# Patient Record
Sex: Female | Born: 1965 | Race: White | Hispanic: No | Marital: Married | Smoking: Former smoker
Health system: Southern US, Community
[De-identification: ages and names within clinical notes are randomized; demographics above are authoritative.]

## PROBLEM LIST (undated history)

## (undated) DIAGNOSIS — J189 Pneumonia, unspecified organism: Secondary | ICD-10-CM

## (undated) DIAGNOSIS — F32A Depression, unspecified: Secondary | ICD-10-CM

## (undated) DIAGNOSIS — Z5189 Encounter for other specified aftercare: Secondary | ICD-10-CM

## (undated) DIAGNOSIS — M1990 Unspecified osteoarthritis, unspecified site: Secondary | ICD-10-CM

## (undated) DIAGNOSIS — K219 Gastro-esophageal reflux disease without esophagitis: Secondary | ICD-10-CM

## (undated) DIAGNOSIS — F988 Other specified behavioral and emotional disorders with onset usually occurring in childhood and adolescence: Secondary | ICD-10-CM

## (undated) DIAGNOSIS — F329 Major depressive disorder, single episode, unspecified: Secondary | ICD-10-CM

## (undated) DIAGNOSIS — F419 Anxiety disorder, unspecified: Secondary | ICD-10-CM

## (undated) DIAGNOSIS — E785 Hyperlipidemia, unspecified: Secondary | ICD-10-CM

## (undated) DIAGNOSIS — I10 Essential (primary) hypertension: Secondary | ICD-10-CM

## (undated) HISTORY — DX: Major depressive disorder, single episode, unspecified: F32.9

## (undated) HISTORY — PX: ABDOMINAL HYSTERECTOMY: SHX81

## (undated) HISTORY — PX: KNEE CARTILAGE SURGERY: SHX688

## (undated) HISTORY — PX: TONSILLECTOMY: SUR1361

## (undated) HISTORY — DX: Encounter for other specified aftercare: Z51.89

## (undated) HISTORY — DX: Depression, unspecified: F32.A

## (undated) HISTORY — DX: Other specified behavioral and emotional disorders with onset usually occurring in childhood and adolescence: F98.8

## (undated) HISTORY — PX: OTHER SURGICAL HISTORY: SHX169

## (undated) HISTORY — DX: Anxiety disorder, unspecified: F41.9

## (undated) HISTORY — PX: CARPAL TUNNEL RELEASE: SHX101

## (undated) HISTORY — PX: FOOT SURGERY: SHX648

## (undated) HISTORY — DX: Hyperlipidemia, unspecified: E78.5

## (undated) HISTORY — PX: BUNIONECTOMY: SHX129

## (undated) HISTORY — DX: Gastro-esophageal reflux disease without esophagitis: K21.9

---

## 1991-02-23 HISTORY — PX: ABDOMINAL HYSTERECTOMY: SHX81

## 2011-08-02 DIAGNOSIS — Z803 Family history of malignant neoplasm of breast: Secondary | ICD-10-CM | POA: Insufficient documentation

## 2015-05-15 DIAGNOSIS — H8112 Benign paroxysmal vertigo, left ear: Secondary | ICD-10-CM | POA: Insufficient documentation

## 2016-11-26 ENCOUNTER — Telehealth: Payer: Self-pay | Admitting: *Deleted

## 2016-11-26 NOTE — Telephone Encounter (Signed)
Received Medical records [x2] from [facility]; forwarded to provider/SLS 10/05

## 2016-11-29 ENCOUNTER — Telehealth: Payer: Self-pay | Admitting: Behavioral Health

## 2016-11-29 NOTE — Telephone Encounter (Signed)
Unable to reach patient at time of Pre-Visit Call.  Left message for patient to return call when available.    

## 2016-11-30 ENCOUNTER — Ambulatory Visit (INDEPENDENT_AMBULATORY_CARE_PROVIDER_SITE_OTHER): Payer: PRIVATE HEALTH INSURANCE | Admitting: Medical

## 2016-11-30 ENCOUNTER — Encounter: Payer: Self-pay | Admitting: Medical

## 2016-11-30 VITALS — BP 130/85 | HR 78 | Temp 98.1°F | Ht 67.0 in | Wt 209.0 lb

## 2016-11-30 DIAGNOSIS — F909 Attention-deficit hyperactivity disorder, unspecified type: Secondary | ICD-10-CM | POA: Diagnosis not present

## 2016-11-30 DIAGNOSIS — Z23 Encounter for immunization: Secondary | ICD-10-CM

## 2016-11-30 DIAGNOSIS — F32A Depression, unspecified: Secondary | ICD-10-CM

## 2016-11-30 DIAGNOSIS — Z0283 Encounter for blood-alcohol and blood-drug test: Secondary | ICD-10-CM

## 2016-11-30 DIAGNOSIS — F419 Anxiety disorder, unspecified: Secondary | ICD-10-CM | POA: Diagnosis not present

## 2016-11-30 DIAGNOSIS — F329 Major depressive disorder, single episode, unspecified: Secondary | ICD-10-CM

## 2016-11-30 MED ORDER — CLONAZEPAM 0.5 MG PO TABS: 0.5000 mg | ORAL_TABLET | Freq: Every day | ORAL | 0 refills | Status: DC

## 2016-11-30 MED ORDER — AMPHETAMINE-DEXTROAMPHETAMINE 15 MG PO TABS
15.0000 mg | ORAL_TABLET | Freq: Two times a day (BID) | ORAL | 0 refills | Status: DC
Start: 2016-11-30 — End: 2016-12-23

## 2016-11-30 NOTE — Patient Instructions (Addendum)
For your history of ADHD will rx adderral xr.  For chronic anxiety will rx clonazepam.  Will get uds today and sign controlled medication contract.  Follow up in 1 month or as needed.   On follow up you could also schedule complete physical exam if due.(for that come I fasting)

## 2016-11-30 NOTE — Progress Notes (Signed)
Subjective:    Patient ID: Abigail Wright, female    DOB: 06-12-1965, 51 y.o.   MRN: [id number]  HPI  Pt here for first time.  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   She has hx of Chronic anxiety, hyperlipidemia, metabolic syndrome, depression, ADHD, and GERD. She is former patient of [facility].  Pt in for new visit.   Insurance issues with other clinic led to changing practices.  Pt has history of chronic anxiety- has been on benzodiazepene for years. Just ran out of medication. Pt in past states celexa made her feel like she was in a fog. Pt is on wellbutrin. She does well with wellbutrin.(sometimes bit of depression but more anxiety)  Pt has history of ADHD- has been on medication for a year. See adhd questioneer scanned.   Review of Systems  Constitutional: Negative for chills, fatigue and fever.  HENT: Negative for congestion and drooling.   Respiratory: Negative for cough, chest tightness, shortness of breath and wheezing.   Cardiovascular: Negative for chest pain and palpitations.  Gastrointestinal: Negative for abdominal pain.  Genitourinary: Negative for dyspareunia and dysuria.  Musculoskeletal: Negative for back pain, joint swelling and neck pain.  Skin: Negative for rash.  Neurological: Negative for dizziness, syncope, speech difficulty, weakness, light-headedness and numbness.  Hematological: Negative for adenopathy. Does not bruise/bleed easily.  Psychiatric/Behavioral: Positive for decreased concentration and dysphoric mood. Negative for behavioral problems, confusion and suicidal ideas. The patient is nervous/anxious.    Past Medical History:  Diagnosis Date  . ADD (attention deficit disorder)   . Anxiety      Social History   Social History  . Marital status: Married    Spouse name: N/A  . Number of children: N/A  . Years of education: N/A    Occupational History  . Not on file.   Social History Main Topics  . Smoking status: Former Games developer  . Smokeless tobacco: Never Used  . Alcohol use Yes  . Drug use: No  . Sexual activity: Yes    Partners: Male    Birth control/ protection: None   Other Topics Concern  . Not on file   Social History Narrative  . No narrative on file    Past Surgical History:  Procedure Laterality Date  . ABDOMINAL HYSTERECTOMY    . cyst removal from left hand Left   . FOOT SURGERY    . TONSILLECTOMY      Family History  Problem Relation Age of Onset  . Cancer Mother   . Diabetes Father   . Heart disease Paternal Uncle   . Hypertension Paternal Uncle     Not on File  No current outpatient prescriptions on file prior to visit.   No current facility-administered medications on file prior to visit.     BP (!) 132/93   Pulse 78   Temp 98.1 F (36.7 C) (Oral)   Ht  (1.702 m)   Wt 209 lb (94.8 kg)   SpO2 98%   BMI 32.73 kg/m       Objective:   Physical Exam  General Mental Status- Alert. General Appearance- Not in acute distress.     Chest and Lung Exam Auscultation: Breath Sounds:-Normal.  Cardiovascular Auscultation:Rythm- Regular. Murmurs & Other Heart Sounds:Auscultation of the heart reveals- No Murmurs.    Neurologic Cranial Nerve exam:- CN III-XII intact(No nystagmus), symmetric smile. Strength:- 5/5  equal and symmetric strength both upper and lower extremities.      Assessment & Plan:  For your history of ADHD will rx adderral xr.  For chronic anxiety will rx clonazepam.  Will get uds today and sign controlled medication contract.(sent to lab and I placed her uds to be scanned.  Our office attempted to look up pt in controlled med web site for state of [place]. No results found. Prior records [facility] labled her as low risk.   Follow up in 1 month or as needed.   Abigail Wright, Ramon Dredge, PA-C

## 2016-12-04 ENCOUNTER — Telehealth: Payer: Self-pay | Admitting: Medical

## 2016-12-04 LAB — PAIN MGMT, PROFILE 8 W/CONF, U
6 Acetylmorphine: NEGATIVE ng/mL (ref <10)
ALCOHOL METABOLITES: NEGATIVE ng/mL (ref <500)
ALPHAHYDROXYALPRAZOLAM: NEGATIVE ng/mL (ref <25)
ALPHAHYDROXYTRIAZOLAM: NEGATIVE ng/mL (ref <50)
AMPHETAMINE: 627 ng/mL — AB (ref <250)
Alphahydroxymidazolam: NEGATIVE ng/mL (ref <50)
Aminoclonazepam: 76 ng/mL — ABNORMAL HIGH (ref <25)
Amphetamines: POSITIVE ng/mL — AB (ref <500)
BENZODIAZEPINES: POSITIVE ng/mL — AB (ref <100)
Buprenorphine, Urine: NEGATIVE ng/mL (ref <5)
COCAINE METABOLITE: NEGATIVE ng/mL (ref <150)
Creatinine: 44.5 mg/dL
HYDROXYETHYLFLURAZEPAM: NEGATIVE ng/mL (ref <50)
Lorazepam: NEGATIVE ng/mL (ref <50)
MARIJUANA METABOLITE: NEGATIVE ng/mL (ref <20)
MDMA: NEGATIVE ng/mL (ref <500)
METHAMPHETAMINE: NEGATIVE ng/mL (ref <250)
Nordiazepam: NEGATIVE ng/mL (ref <50)
OXAZEPAM: NEGATIVE ng/mL (ref <50)
OXIDANT: NEGATIVE ug/mL (ref <200)
OXYCODONE: NEGATIVE ng/mL (ref <100)
Opiates: NEGATIVE ng/mL (ref <100)
Temazepam: NEGATIVE ng/mL (ref <50)
pH: 6.92 (ref 4.5–9.0)

## 2016-12-04 NOTE — Telephone Encounter (Signed)
Opened to review controlled med list history.

## 2016-12-23 ENCOUNTER — Ambulatory Visit (INDEPENDENT_AMBULATORY_CARE_PROVIDER_SITE_OTHER): Payer: PRIVATE HEALTH INSURANCE | Admitting: Medical

## 2016-12-23 ENCOUNTER — Encounter: Payer: Self-pay | Admitting: Medical

## 2016-12-23 VITALS — BP 125/88 | HR 73 | Temp 98.2°F | Resp 16 | Ht 67.0 in | Wt 210.0 lb

## 2016-12-23 DIAGNOSIS — F419 Anxiety disorder, unspecified: Secondary | ICD-10-CM | POA: Diagnosis not present

## 2016-12-23 DIAGNOSIS — F909 Attention-deficit hyperactivity disorder, unspecified type: Secondary | ICD-10-CM | POA: Diagnosis not present

## 2016-12-23 MED ORDER — CLONAZEPAM 0.5 MG PO TABS: 0.5000 mg | ORAL_TABLET | Freq: Every day | ORAL | 3 refills | Status: DC

## 2016-12-23 MED ORDER — AMPHETAMINE-DEXTROAMPHETAMINE 15 MG PO TABS: 15.0000 mg | ORAL_TABLET | Freq: Two times a day (BID) | ORAL | 0 refills | Status: DC

## 2016-12-23 NOTE — Patient Instructions (Addendum)
Your anxiety has been well controlled.  Continue current medication regimen for your mood.  Refilling her clonazepam today.  Your ADHD is well controlled presently with no side effects.  I am providing you with 4 months of medication but the prescriptions are post dated. Do not loose prescriptions. Explained the importance of this. Pt expressed understanding.  Follow up in 4 months or as needed

## 2016-12-23 NOTE — Progress Notes (Signed)
Subjective:    Patient ID: Abigail Wright, female    DOB: [birth year], 51 y.o.   MRN: [id number]  HPI  Pt anxiety and ADD well controlled. No side effects from medication reported. BP is well controlled today. No tachycardia today. Mood is good/well controlled.   Pt signed controlled medication contract. Old records review and summer she had a UDS. Positive for ADD med. No illegal drugs or other positives on screen done at former clinic.  Review of Systems  Constitutional: Negative for chills, fatigue and fever.  Respiratory: Negative for cough, chest tightness, shortness of breath and wheezing.   Cardiovascular: Negative for chest pain and palpitations.  Musculoskeletal: Negative for back pain.  Neurological: Negative for dizziness, speech difficulty, weakness, light-headedness and headaches.  Psychiatric/Behavioral: Negative for agitation, behavioral problems, decreased concentration, hallucinations, self-injury and sleep disturbance. The patient is not nervous/anxious.    Past Medical History:  Diagnosis Date  . ADD (attention deficit disorder)   . Anxiety   . Depression   . GERD (gastroesophageal reflux disease)    in past but recently controlled with diet and exercise.  Marland Kitchen Hyperlipidemia      Social History   Social History  . Marital status: Married    Spouse name: N/A  . Number of children: N/A  . Years of education: N/A   Occupational History  . Not on file.   Social History Main Topics  . Smoking status: Former Games developer  . Smokeless tobacco: Never Used  . Alcohol use Yes  . Drug use: No  . Sexual activity: Yes    Partners: Male    Birth control/ protection: None   Other Topics Concern  . Not on file   Social History Narrative  . No narrative on file    Past Surgical History:  Procedure Laterality Date  . ABDOMINAL HYSTERECTOMY    . cyst removal from left hand Left   . FOOT SURGERY    . TONSILLECTOMY      Family History  Problem Relation Age of  Onset  . Cancer Mother   . Diabetes Father   . Heart disease Paternal Uncle   . Hypertension Paternal Uncle     Not on File  Current Outpatient Prescriptions on File Prior to Visit  Medication Sig Dispense Refill  . buPROPion (WELLBUTRIN) 75 MG tablet Take 75 mg by mouth.    . doxycycline (VIBRAMYCIN) 100 MG capsule Take 100 mg by mouth 2 (two) times daily.    . fluticasone (FLONASE) 50 MCG/ACT nasal spray Place into the nose.    Marland Kitchen glucosamine-chondroitin 500-400 MG tablet Take by mouth.    . Multiple Vitamins-Minerals (THERA-M) TABS Take by mouth.     No current facility-administered medications on file prior to visit.     BP 125/88   Pulse 73   Temp 98.2 F (36.8 C) (Oral)   Resp 16   Ht 5\' 7"  (1.702 m)   Wt 210 lb (95.3 kg)   SpO2 100%   BMI 32.89 kg/m       Objective:   Physical Exam  General Mental Status- Alert. General Appearance- Not in acute distress.   Skin General: Color- Normal Color. Moisture- Normal Moisture.  Neck Carotid Arteries- Normal color. Moisture- Normal Moisture. No carotid bruits. No JVD.  Chest and Lung Exam Auscultation: Breath Sounds:-Normal.  Cardiovascular Auscultation:Rythm- Regular. Murmurs & Other Heart Sounds:Auscultation of the heart reveals- No Murmurs.  Abdomen Inspection:-Inspeection Normal. Palpation/Percussion:Note:No mass. Palpation and Percussion of the abdomen reveal-  Non Tender, Non Distended + BS, no rebound or guarding.    Neurologic Cranial Nerve exam:- CN III-XII intact(No nystagmus), symmetric smile. Strength:- 5/5 equal and symmetric strength both upper and lower extremities.      Assessment & Plan:  Your anxiety has been well controlled.  Continue current medication regimen for your mood.  Refilling her clonazepam today.  Your ADHD is well controlled presently with no side effects.  I am providing you with 4 months of medication but the prescriptions are post dated. Do not loose prescriptions.  Explained the importance of this. Pt expressed understanding.  Follow up in 4 months or as needed  Donte Kary, Ramon DredgeEdward, VF CorporationPA-C

## 2016-12-24 ENCOUNTER — Ambulatory Visit: Payer: PRIVATE HEALTH INSURANCE | Admitting: Medical

## 2017-03-21 HISTORY — PX: TENDON REPAIR: SHX5111

## 2017-04-25 ENCOUNTER — Ambulatory Visit: Payer: PRIVATE HEALTH INSURANCE | Admitting: Medical

## 2017-04-25 ENCOUNTER — Encounter: Payer: Self-pay | Admitting: Medical

## 2017-04-25 VITALS — BP 131/85 | HR 95 | Temp 98.1°F | Resp 16 | Ht 67.0 in | Wt 219.6 lb

## 2017-04-25 DIAGNOSIS — Z79899 Other long term (current) drug therapy: Secondary | ICD-10-CM

## 2017-04-25 DIAGNOSIS — F909 Attention-deficit hyperactivity disorder, unspecified type: Secondary | ICD-10-CM

## 2017-04-25 DIAGNOSIS — F419 Anxiety disorder, unspecified: Secondary | ICD-10-CM | POA: Diagnosis not present

## 2017-04-25 DIAGNOSIS — F32A Depression, unspecified: Secondary | ICD-10-CM

## 2017-04-25 DIAGNOSIS — F329 Major depressive disorder, single episode, unspecified: Secondary | ICD-10-CM

## 2017-04-25 MED ORDER — BUPROPION HCL ER (XL) 300 MG PO TB24: 300.0000 mg | ORAL_TABLET | Freq: Every day | ORAL | 3 refills | Status: DC

## 2017-04-25 MED ORDER — AMPHETAMINE-DEXTROAMPHETAMINE 15 MG PO TABS: 15.0000 mg | ORAL_TABLET | Freq: Two times a day (BID) | ORAL | 0 refills | Status: DC

## 2017-04-25 MED ORDER — BUPROPION HCL 75 MG PO TABS: ORAL_TABLET | ORAL | 1 refills | Status: DC

## 2017-04-25 NOTE — Patient Instructions (Addendum)
Your ADD is controlled recently and I did refill that prescription today.  You will be able to fill that one year current prescription expires.  Your anxiety is controlled and advise you continue the clonazepam.  When you run out of current prescription and then will refill the clonazepam.  Your depression is well controlled recently and I sent in refills of Wellbutrin.  Please schedule complete physical in 3 months or as needed.  Come in fasting that day.

## 2017-04-25 NOTE — Progress Notes (Signed)
Subjective:    Patient ID: Abigail Wright, female    DOB: Sep 07, 1965, 52 y.o.   MRN: [id number]  HPI  Pt in for follow up. She is doing well with her attention. No side effects with her adderal.   Pt bp not elevated. Not tachycardic.  Pt mood is well controlled on Wellbutrin.     Review of Systems  Constitutional: Negative for chills, diaphoresis and fatigue.  Respiratory: Negative for cough, chest tightness, shortness of breath and wheezing.   Cardiovascular: Negative for chest pain and palpitations.  Gastrointestinal: Negative for abdominal pain and blood in stool.  Musculoskeletal: Negative for back pain, joint swelling, myalgias and neck stiffness.       Recent gangliion cyst surgery.  Neurological: Negative for dizziness and headaches.  Hematological: Negative for adenopathy. Does not bruise/bleed easily.  Psychiatric/Behavioral: Negative for behavioral problems, confusion, dysphoric mood, self-injury and suicidal ideas.       Concentration good.    Past Medical History:  Diagnosis Date  . ADD (attention deficit disorder)   . Anxiety   . Depression   . GERD (gastroesophageal reflux disease)    in past but recently controlled with diet and exercise.  Marland Kitchen Hyperlipidemia      Social History   Socioeconomic History  . Marital status: Married    Spouse name: Not on file  . Number of children: Not on file  . Years of education: Not on file  . Highest education level: Not on file  Social Needs  . Financial resource strain: Not on file  . Food insecurity - worry: Not on file  . Food insecurity - inability: Not on file  . Transportation needs - medical: Not on file  . Transportation needs - non-medical: Not on file  Occupational History  . Not on file  Tobacco Use  . Smoking status: Former Games developer  . Smokeless tobacco: Never Used  Substance and Sexual Activity  . Alcohol use: Yes  . Drug use: No  . Sexual activity: Yes    Partners: Male    Birth  control/protection: None  Other Topics Concern  . Not on file  Social History Narrative  . Not on file      Family History  Problem Relation Age of Onset  . Cancer Mother   . Diabetes Father   . Heart disease Paternal Uncle   . Hypertension Paternal Uncle     Not on File  Current Outpatient Medications on File Prior to Visit  Medication Sig Dispense Refill  . amphetamine-dextroamphetamine (ADDERALL) 15 MG tablet Take 1 tablet by mouth 2 (two) times daily. Can give generic 60 tablet 0  . buPROPion (WELLBUTRIN) 75 MG tablet Take 75 mg by mouth.    . clonazePAM (KLONOPIN) 0.5 MG tablet Take 1 tablet (0.5 mg total) by mouth at bedtime. Please don't refill until November 9,2018 30 tablet 3  . doxycycline (VIBRAMYCIN) 100 MG capsule Take 100 mg by mouth 2 (two) times daily.    . fluticasone (FLONASE) 50 MCG/ACT nasal spray Place into the nose.    Marland Kitchen glucosamine-chondroitin 500-400 MG tablet Take by mouth.    . meloxicam (MOBIC) 15 MG tablet Take by mouth.    . Multiple Vitamins-Minerals (THERA-M) TABS Take by mouth.     No current facility-administered medications on file prior to visit.     BP 131/85   Pulse 95   Temp 98.1 F (36.7 C) (Oral)   Resp 16   Ht 5\' 7"  (1.702 m)  Wt 219 lb 9.6 oz (99.6 kg)   SpO2 99%   BMI 34.39 kg/m       Objective:   Physical Exam   General- No acute distress. Pleasant patient. Neck- Full range of motion, no jvd Lungs- Clear, even and unlabored. Heart- regular rate and rhythm. Neurologic- CNII- XII grossly intact.      Assessment & Plan:  Your ADD is controlled recently and I did refill that prescription today.  You will be able to fill that one year current prescription expires.  Your anxiety is controlled and advise you continue the clonazepam.  When you run out of current prescription and then will refill the clonazepam.  Your depression is well controlled recently and I sent in refills of Wellbutrin.  At the very end of visit  patient states that her pharmacy called and stated her pain was Wellbutrin XL 300 mg a day.  This is different from our practice.  I have asked medical assistant Jasmine to call pharmacy and clarify/verify this was flu direction. After hours it looks like she did not cal the pharmacy. I sent in scription of the generic 300 mg extended release form.  I asked pharmacy not to fill the prescription if this is not her current dose.  Also asked him to contact me.  Please schedule complete physical in 3 months or as needed l.  Come in fasting that day.   Esperanza RichtersEdward Abiola Behring, PA-C

## 2017-04-25 NOTE — Progress Notes (Signed)
7

## 2017-04-26 ENCOUNTER — Telehealth: Payer: Self-pay | Admitting: Medical

## 2017-04-26 NOTE — Telephone Encounter (Signed)
Copied from CRM [id number]. Topic: Inquiry >> Apr 26, 2017  7:58 AM Yvonna Alanisobinson, Andra M wrote: Reason for CRM: Patient called stating that her prescription of BuPROPion (WELLBUTRIN XL) 300 MG 24 hr tablet is not at her pharmacy. Patient saw Clement Sayresdward Sagiuer on yesterday. [facility] [place] - [id number] [phone] (Phone) [phone] (Fax).

## 2017-04-26 NOTE — Telephone Encounter (Signed)
[facility] called and spoke to JaneRyan, [facility] who verbalized patient picked up medication yesterday.

## 2017-05-26 ENCOUNTER — Other Ambulatory Visit: Payer: Self-pay | Admitting: Medical

## 2017-05-26 NOTE — Telephone Encounter (Signed)
I refilled her clonazepam. But can you double check on controlled substance contract. She should have one. I remember on  last visit making sure all requiirements met?  Also I just sent rx but accidentally did not give refills. Let pt know that was a mistake but will refill next month with 2 refills.  If no contract then she needs to come in just to review and sign.

## 2017-05-26 NOTE — Telephone Encounter (Signed)
Last RX: 12/23/2016 3refills Last OV:04/25/2017 Next OV:07/26/2017 UDS:04/25/2017 CSC: None on file

## 2017-05-28 ENCOUNTER — Other Ambulatory Visit: Payer: Self-pay | Admitting: Medical

## 2017-05-29 ENCOUNTER — Telehealth: Payer: Self-pay | Admitting: Medical

## 2017-05-29 NOTE — Telephone Encounter (Signed)
Will you check on pt uds status. Computer reminder states she is not up to date. Will you check please. Let me know. If not up to date then get her scheduled for one.

## 2017-05-30 ENCOUNTER — Telehealth: Payer: Self-pay | Admitting: *Deleted

## 2017-05-30 ENCOUNTER — Telehealth: Payer: Self-pay | Admitting: Medical

## 2017-05-30 ENCOUNTER — Other Ambulatory Visit: Payer: Self-pay | Admitting: Medical

## 2017-05-30 MED ORDER — AMPHETAMINE-DEXTROAMPHETAMINE 15 MG PO TABS: 15.0000 mg | ORAL_TABLET | Freq: Two times a day (BID) | ORAL | 0 refills | Status: DC

## 2017-05-30 NOTE — Telephone Encounter (Signed)
Copied from CRM [id number]. Topic: Quick Communication - Rx Refill/Question >> May 30, 2017  9:06 AM Oneal GroutSebastian, Jennifer S wrote: Medication: amphetamine-dextroamphetamine (ADDERALL) 15 MG tablet  Has the patient contacted their pharmacy? Yes.   (Agent: If no, request that the patient contact the pharmacy for the refill.) Preferred Pharmacy (with phone number or street name): [facility] Agent: Please be advised that RX refills may take up to 3 business days. We ask that you follow-up with your pharmacy.

## 2017-05-30 NOTE — Telephone Encounter (Signed)
Refill  Request Adderall  LOV 04-25-2017  Pharmacy on File

## 2017-05-30 NOTE — Telephone Encounter (Signed)
Pt signed contract on 11/30/2016

## 2017-05-30 NOTE — Telephone Encounter (Signed)
Refill of adderall sent to pt pharmacy.

## 2017-05-30 NOTE — Telephone Encounter (Signed)
Copied from CRM [id number]. Topic: Inquiry >> May 30, 2017  5:37 PM Raquel SarnaHayes, Teresa G wrote: Pt is requesting [facility] to Dr. Carmelia RollerWendling. Pt's daughter is going to Dr Carmelia RollerWendling and not having issues with Rx's being filled.

## 2017-05-30 NOTE — Telephone Encounter (Signed)
But what about uds. The reminder was about drug screen?

## 2017-05-30 NOTE — Telephone Encounter (Signed)
Can switch to Dr. Carmelia RollerWendling, Regarding problems some reason reminder for UDS popped up in computer. I did find the result lab section  . I was not in town on Thursday or Friday when she was due for refill.  So it does look like she is due for refill.   I will send that rx to her pharmacy.  Can switch to Dr. Carmelia RollerWendling if she wants.

## 2017-05-30 NOTE — Telephone Encounter (Signed)
Pt called to check status of refill, call pt to advise °

## 2017-05-31 ENCOUNTER — Encounter: Payer: Self-pay | Admitting: Medical

## 2017-05-31 NOTE — Telephone Encounter (Signed)
Dr Carmelia RollerWendling-- is this PCP change ok with you?

## 2017-05-31 NOTE — Telephone Encounter (Signed)
UDS is under lab tab last uds was 11/30/2016

## 2017-05-31 NOTE — Telephone Encounter (Signed)
I reviewed her medicines and may suggest some changes if she agrees to stay with me, but she is welcome to.

## 2017-05-31 NOTE — Telephone Encounter (Signed)
Ok to switch to Dr. Wendling. 

## 2017-05-31 NOTE — Telephone Encounter (Signed)
Notified pt. She states she is not unhappy with her current PCP, just unhappy that prescriptions have been delayed for the last couple of months. Pt states she used to get printed prescriptions for 3 months at a time to last until her next follow up and never had a problem with getting refills. Now Rxs are only for 30 days at a time with no refills and she has run out of medications for the last 2 months. Pt states she feels she is doing her part as far as keeping appointments and requesting her refills.  Pt wants to know what can be done to prevent this in the future and she will be happy to stay with current PCP.  Please advise?

## 2017-06-01 ENCOUNTER — Telehealth: Payer: Self-pay | Admitting: Medical

## 2017-06-01 MED ORDER — AMPHETAMINE-DEXTROAMPHETAMINE 15 MG PO TABS: 15.0000 mg | ORAL_TABLET | Freq: Two times a day (BID) | ORAL | 0 refills | Status: DC

## 2017-06-01 NOTE — Telephone Encounter (Signed)
Pt reported below that she was not really wanting to change PCPs if she can continue to get her refills in a timely manner. I sent pt a mychart message to request refills on any medication 1 week prior to running out and to let us know if she changes her mind in the future about PCP change but will leave with PA, Saguier at this time.

## 2017-06-01 NOTE — Telephone Encounter (Signed)
I did talk with pharmacist today and he stated that it was okay for me to send postdated prescriptions via imprivata.  But I could only do for 3 months at a time.  So I went ahead and send in 2 prescriptions one for May and another June.

## 2017-06-27 ENCOUNTER — Other Ambulatory Visit: Payer: Self-pay | Admitting: Medical

## 2017-06-28 NOTE — Telephone Encounter (Signed)
Rx Refill of clonazepam sent to pt pharmacy.

## 2017-06-28 NOTE — Telephone Encounter (Signed)
Refill Request: Clonazepam  Last RX:05/26/2017 Last OV: 3/4/209 Next OV:07/26/2017 UDS: 11/30/2016 CSC:11/30/2016

## 2017-06-28 NOTE — Telephone Encounter (Signed)
Pt. Is calling about prescription again.  She is out of medication  Please send to [facility]

## 2017-06-29 ENCOUNTER — Encounter: Payer: Self-pay | Admitting: Medical

## 2017-06-29 ENCOUNTER — Telehealth: Payer: Self-pay | Admitting: Medical

## 2017-06-29 MED ORDER — AMPHETAMINE-DEXTROAMPHETAMINE 15 MG PO TABS: 15.0000 mg | ORAL_TABLET | Freq: Two times a day (BID) | ORAL | 0 refills | Status: DC

## 2017-06-29 NOTE — Telephone Encounter (Signed)
Rx adderall sent to the pharmacy.

## 2017-07-21 ENCOUNTER — Encounter: Payer: Self-pay | Admitting: Medical

## 2017-07-26 ENCOUNTER — Encounter: Payer: Self-pay | Admitting: Internal Medicine

## 2017-07-26 ENCOUNTER — Ambulatory Visit (INDEPENDENT_AMBULATORY_CARE_PROVIDER_SITE_OTHER): Payer: PRIVATE HEALTH INSURANCE | Admitting: Medical

## 2017-07-26 ENCOUNTER — Encounter: Payer: Self-pay | Admitting: Medical

## 2017-07-26 VITALS — BP 124/81 | HR 61 | Temp 97.9°F | Resp 16 | Ht 67.0 in | Wt 214.4 lb

## 2017-07-26 DIAGNOSIS — Z0000 Encounter for general adult medical examination without abnormal findings: Secondary | ICD-10-CM

## 2017-07-26 DIAGNOSIS — Z1211 Encounter for screening for malignant neoplasm of colon: Secondary | ICD-10-CM

## 2017-07-26 DIAGNOSIS — Z113 Encounter for screening for infections with a predominantly sexual mode of transmission: Secondary | ICD-10-CM

## 2017-07-26 DIAGNOSIS — Z23 Encounter for immunization: Secondary | ICD-10-CM | POA: Diagnosis not present

## 2017-07-26 LAB — COMPREHENSIVE METABOLIC PANEL
ALBUMIN: 4.3 g/dL (ref 3.5–5.2)
ALT: 19 U/L (ref 0–35)
AST: 18 U/L (ref 0–37)
Alkaline Phosphatase: 52 U/L (ref 39–117)
BUN: 13 mg/dL (ref 6–23)
CHLORIDE: 102 meq/L (ref 96–112)
CO2: 31 meq/L (ref 19–32)
CREATININE: 0.74 mg/dL (ref 0.40–1.20)
Calcium: 9.6 mg/dL (ref 8.4–10.5)
GFR: 88.22 mL/min (ref >60.00)
Glucose, Bld: 95 mg/dL (ref 70–99)
POTASSIUM: 4.7 meq/L (ref 3.5–5.1)
SODIUM: 140 meq/L (ref 135–145)
Total Bilirubin: 0.6 mg/dL (ref 0.2–1.2)
Total Protein: 6.5 g/dL (ref 6.0–8.3)

## 2017-07-26 LAB — LIPID PANEL
Cholesterol: 195 mg/dL (ref 0–200)
HDL: 60.2 mg/dL (ref >39.00)
LDL CALC: 119 mg/dL — AB (ref 0–99)
NonHDL: 134.97
TRIGLYCERIDES: 80 mg/dL (ref 0.0–149.0)
Total CHOL/HDL Ratio: 3
VLDL: 16 mg/dL (ref 0.0–40.0)

## 2017-07-26 LAB — CBC
HEMATOCRIT: 43.4 % (ref 36.0–46.0)
Hemoglobin: 14.5 g/dL (ref 12.0–15.0)
MCHC: 33.4 g/dL (ref 30.0–36.0)
MCV: 88.3 fl (ref 78.0–100.0)
Platelets: 292 10*3/uL (ref 150.0–400.0)
RBC: 4.92 Mil/uL (ref 3.87–5.11)
RDW: 13.8 % (ref 11.5–15.5)
WBC: 5.5 10*3/uL (ref 4.0–10.5)

## 2017-07-26 LAB — URINALYSIS, ROUTINE W REFLEX MICROSCOPIC
Bilirubin Urine: NEGATIVE
Hgb urine dipstick: NEGATIVE
KETONES UR: NEGATIVE
Leukocytes, UA: NEGATIVE
NITRITE: NEGATIVE
PH: 5.5 (ref 5.0–8.0)
RBC / HPF: NONE SEEN (ref 0–?)
Specific Gravity, Urine: <=1.005 — AB (ref 1.000–1.030)
Total Protein, Urine: NEGATIVE
Urine Glucose: NEGATIVE
Urobilinogen, UA: 0.2 (ref 0.0–1.0)
WBC UA: NONE SEEN (ref 0–?)

## 2017-07-26 MED ORDER — BUPROPION HCL ER (XL) 300 MG PO TB24: 300.0000 mg | ORAL_TABLET | Freq: Every day | ORAL | 3 refills | Status: DC

## 2017-07-26 MED ORDER — TYPHOID VACCINE PO CPDR: 1.0000 | DELAYED_RELEASE_CAPSULE | ORAL | 0 refills | Status: DC

## 2017-07-26 MED ORDER — CIPROFLOXACIN HCL 500 MG PO TABS: 500.0000 mg | ORAL_TABLET | Freq: Two times a day (BID) | ORAL | 0 refills | Status: DC

## 2017-07-26 MED ORDER — CLONAZEPAM 0.5 MG PO TABS: 0.5000 mg | ORAL_TABLET | Freq: Every day | ORAL | 0 refills | Status: DC

## 2017-07-26 MED ORDER — AMPHETAMINE-DEXTROAMPHETAMINE 15 MG PO TABS: 15.0000 mg | ORAL_TABLET | Freq: Two times a day (BID) | ORAL | 0 refills | Status: DC

## 2017-07-26 NOTE — Progress Notes (Signed)
Subjective:    Patient ID: Abigail Wright, Abigail Wright    DOB: [birth year], 52 y.o.   MRN: [id number]  HPI   Pt in for CPE.  Pt does need for colonoscopy. Never had in the past.  Pt is fasting today.   Pt up to date on mammograms and pap smears.  Pt is traveling to [place] in October. Want typhoid vaccine and hep A vaccine.  Pt has been exercising daily. She is dong aerobic type exercise, walking, spin and weights.  Pt does want prescription of cipro if during travel to [place] she were to get travelers diarrhea.   Review of Systems  Constitutional: Negative for chills, fatigue and fever.  HENT: Negative for congestion, ear discharge, ear pain, hearing loss, mouth sores, postnasal drip, rhinorrhea, sinus pressure and sinus pain.   Eyes: Negative for photophobia and pain.  Respiratory: Negative for cough, chest tightness, shortness of breath and wheezing.   Cardiovascular: Negative for chest pain and palpitations.  Gastrointestinal: Negative for abdominal pain, diarrhea, nausea and vomiting.  Endocrine: Negative for polydipsia and polyuria.  Genitourinary: Negative for dyspareunia, dysuria, flank pain, frequency, hematuria, pelvic pain, urgency, vaginal bleeding and vaginal pain.  Musculoskeletal: Negative for back pain, myalgias and neck pain.  Skin: Negative for rash.  Neurological: Negative for dizziness, speech difficulty, weakness and light-headedness.  Hematological: Negative for adenopathy. Does not bruise/bleed easily.  Psychiatric/Behavioral: Negative for behavioral problems, confusion, hallucinations, self-injury and suicidal ideas. The patient is not nervous/anxious.      Past Medical History:  Diagnosis Date  . ADD (attention deficit disorder)   . Anxiety   . Depression   . GERD (gastroesophageal reflux disease)    in past but recently controlled with diet and exercise.  Marland Kitchen. Hyperlipidemia      Social History   Socioeconomic History  . Marital status: Married   Spouse name: Not on file  . Number of children: Not on file  . Years of education: Not on file  . Highest education level: Not on file  Occupational History  . Not on file  Social Needs  . Financial resource strain: Not on file  . Food insecurity:    Worry: Not on file    Inability: Not on file  . Transportation needs:    Medical: Not on file    Non-medical: Not on file  Tobacco Use  . Smoking status: Former Games developermoker  . Smokeless tobacco: Never Used  Substance and Sexual Activity  . Alcohol use: Yes  . Drug use: No  . Sexual activity: Yes    Partners: Male    Birth control/protection: None  Lifestyle  . Physical activity:    Days per week: Not on file    Minutes per session: Not on file  . Stress: Not on file  Relationships  . Social connections:    Talks on phone: Not on file    Gets together: Not on file    Attends religious service: Not on file    Active member of club or organization: Not on file    Attends meetings of clubs or organizations: Not on file    Relationship status: Not on file  . Intimate partner violence:    Fear of current or ex partner: Not on file    Emotionally abused: Not on file    Physically abused: Not on file    Forced sexual activity: Not on file  Other Topics Concern  . Not on file  Social History Narrative  .  Not on file    Past Surgical History:  Procedure Laterality Date  . ABDOMINAL HYSTERECTOMY    . cyst removal from left hand Left   . FOOT SURGERY    . TENDON REPAIR Left 03/21/2017   Left hand   . TONSILLECTOMY      Family History  Problem Relation Age of Onset  . Cancer Mother   . Diabetes Father   . Heart disease Paternal Uncle   . Hypertension Paternal Uncle     Not on File  Current Outpatient Medications on File Prior to Visit  Medication Sig Dispense Refill  . amoxicillin (AMOXIL) 875 MG tablet Take 875 mg by mouth 2 (two) times daily.    Marland Kitchen amphetamine-dextroamphetamine (ADDERALL) 15 MG tablet Take 1 tablet by  mouth 2 (two) times daily. Fill when out of current script. 60 tablet 0  . buPROPion (WELLBUTRIN XL) 300 MG 24 hr tablet Take 1 tablet (300 mg total) by mouth daily. 30 tablet 3  . clonazePAM (KLONOPIN) 0.5 MG tablet TAKE 1 TABLET BY MOUTH EVERY NIGHT AT BEDTIME 30 tablet 0  . doxycycline (VIBRAMYCIN) 100 MG capsule Take 100 mg by mouth 2 (two) times daily.    . fluticasone (FLONASE) 50 MCG/ACT nasal spray Place into the nose.    Marland Kitchen glucosamine-chondroitin 500-400 MG tablet Take by mouth.    . Multiple Vitamins-Minerals (THERA-M) TABS Take by mouth.     No current facility-administered medications on file prior to visit.     BP 124/81   Pulse 61   Temp 97.9 F (36.6 C) (Oral)   Resp 16   Ht 5\' 7"  (1.702 m)   Wt 214 lb 6.4 oz (97.3 kg)   SpO2 100%   BMI 33.58 kg/m        Objective:   Physical Exam  General Mental Status- Alert. General Appearance- Not in acute distress.   Skin General: Color- Normal Color. Moisture- Normal Moisture. Small scattered moles. (none worrisome). Pt sees derm regularly.  Neck Carotid Arteries- Normal color. Moisture- Normal Moisture. No carotid bruits. No JVD.  Chest and Lung Exam Auscultation: Breath Sounds:-Normal.  Cardiovascular Auscultation:Rythm- Regular. Murmurs & Other Heart Sounds:Auscultation of the heart reveals- No Murmurs.  Abdomen Inspection:-Inspeection Normal. Palpation/Percussion:Note:No mass. Palpation and Percussion of the abdomen reveal- Non Tender, Non Distended + BS, no rebound or guarding.  Neurologic Cranial Nerve exam:- CN III-XII intact(No nystagmus), symmetric smile. Strength:- 5/5 equal and symmetric strength both upper and lower extremities.        Assessment & Plan:  For you wellness exam today I have ordered cbc, cmp,, lipid panel, ua and hiv.  Vaccine given today hep A, and vivotif.(for travel to [place] coming)  Recommend exercise and healthy diet.  We will let you know lab results as they come  in.  Follow up date appointment will be determined after lab review.   Referral to GI for colonoscopy.  Follow up date to be determined after lab review

## 2017-07-26 NOTE — Patient Instructions (Addendum)
For you wellness exam today I have ordered cbc, cmp, lipid panel, ua and hiv.  Vaccine given today hep A, and vivotif.(for travel to [place] coming)  Recommend exercise and healthy diet.  We will let you know lab results as they come in.  Follow up date appointment will be determined after lab review.   Referral to GI for colonoscopy.  Follow up date to be determined after lab review   Preventive Care 40-64 Years, Female Preventive care refers to lifestyle choices and visits with your health care provider that can promote health and wellness. What does preventive care include?  A yearly physical exam. This is also called an annual well check.  Dental exams once or twice a year.  Routine eye exams. Ask your health care provider how often you should have your eyes checked.  Personal lifestyle choices, including: ? Daily care of your teeth and gums. ? Regular physical activity. ? Eating a healthy diet. ? Avoiding tobacco and drug use. ? Limiting alcohol use. ? Practicing safe sex. ? Taking low-dose aspirin daily starting at age 75. ? Taking vitamin and mineral supplements as recommended by your health care provider. What happens during an annual well check? The services and screenings done by your health care provider during your annual well check will depend on your age, overall health, lifestyle risk factors, and family history of disease. Counseling Your health care provider may ask you questions about your:  Alcohol use.  Tobacco use.  Drug use.  Emotional well-being.  Home and relationship well-being.  Sexual activity.  Eating habits.  Work and work Statistician.  Method of birth control.  Menstrual cycle.  Pregnancy history.  Screening You may have the following tests or measurements:  Height, weight, and BMI.  Blood pressure.  Lipid and cholesterol levels. These may be checked every 5 years, or more frequently if you are over 55 years old.  Skin  check.  Lung cancer screening. You may have this screening every year starting at age 8 if you have a 30-pack-year history of smoking and currently smoke or have quit within the past 15 years.  Fecal occult blood test (FOBT) of the stool. You may have this test every year starting at age 71.  Flexible sigmoidoscopy or colonoscopy. You may have a sigmoidoscopy every 5 years or a colonoscopy every 10 years starting at age 91.  Hepatitis C blood test.  Hepatitis B blood test.  Sexually transmitted disease (STD) testing.  Diabetes screening. This is done by checking your blood sugar (glucose) after you have not eaten for a while (fasting). You may have this done every 1-3 years.  Mammogram. This may be done every 1-2 years. Talk to your health care provider about when you should start having regular mammograms. This may depend on whether you have a family history of breast cancer.  BRCA-related cancer screening. This may be done if you have a family history of breast, ovarian, tubal, or peritoneal cancers.  Pelvic exam and Pap test. This may be done every 3 years starting at age 60. Starting at age 39, this may be done every 5 years if you have a Pap test in combination with an HPV test.  Bone density scan. This is done to screen for osteoporosis. You may have this scan if you are at high risk for osteoporosis.  Discuss your test results, treatment options, and if necessary, the need for more tests with your health care provider. Vaccines Your health care provider may  recommend certain vaccines, such as:  Influenza vaccine. This is recommended every year.  Tetanus, diphtheria, and acellular pertussis (Tdap, Td) vaccine. You may need a Td booster every 10 years.  Varicella vaccine. You may need this if you have not been vaccinated.  Zoster vaccine. You may need this after age 24.  Measles, mumps, and rubella (MMR) vaccine. You may need at least one dose of MMR if you were born in 1957  or later. You may also need a second dose.  Pneumococcal 13-valent conjugate (PCV13) vaccine. You may need this if you have certain conditions and were not previously vaccinated.  Pneumococcal polysaccharide (PPSV23) vaccine. You may need one or two doses if you smoke cigarettes or if you have certain conditions.  Meningococcal vaccine. You may need this if you have certain conditions.  Hepatitis A vaccine. You may need this if you have certain conditions or if you travel or work in places where you may be exposed to hepatitis A.  Hepatitis B vaccine. You may need this if you have certain conditions or if you travel or work in places where you may be exposed to hepatitis B.  Haemophilus influenzae type b (Hib) vaccine. You may need this if you have certain conditions.  Talk to your health care provider about which screenings and vaccines you need and how often you need them. This information is not intended to replace advice given to you by your health care provider. Make sure you discuss any questions you have with your health care provider. Document Released: 03/07/2015 Document Revised: 10/29/2015 Document Reviewed: 12/10/2014 Elsevier Interactive Patient Education  [employer].

## 2017-08-11 ENCOUNTER — Encounter: Payer: Self-pay | Admitting: Medical

## 2017-08-12 ENCOUNTER — Telehealth: Payer: Self-pay | Admitting: Medical

## 2017-08-12 ENCOUNTER — Other Ambulatory Visit: Payer: Self-pay | Admitting: Medical

## 2017-08-12 MED ORDER — BUPROPION HCL ER (XL) 300 MG PO TB24: 300.0000 mg | ORAL_TABLET | Freq: Every day | ORAL | 3 refills | Status: DC

## 2017-08-12 NOTE — Telephone Encounter (Signed)
Rx wellbutrin sent to the pharmacy.

## 2017-08-13 ENCOUNTER — Telehealth: Payer: Self-pay | Admitting: Medical

## 2017-08-13 MED ORDER — CLONAZEPAM 0.5 MG PO TABS: 0.5000 mg | ORAL_TABLET | Freq: Every day | ORAL | 0 refills | Status: DC

## 2017-08-13 NOTE — Telephone Encounter (Signed)
Refilled clonazepam. Pt traveling out of country and would run out on trip. She talked with pharmacist and they said ok to refill early.

## 2017-08-15 NOTE — Telephone Encounter (Signed)
Refill Request: Clonazepam

## 2017-08-29 ENCOUNTER — Encounter: Payer: Self-pay | Admitting: Gastroenterology

## 2017-09-20 ENCOUNTER — Other Ambulatory Visit: Payer: Self-pay | Admitting: Medical

## 2017-09-20 NOTE — Telephone Encounter (Signed)
Rx refill of adderall sent to pt pharmacy. 

## 2017-09-20 NOTE — Telephone Encounter (Signed)
Refill Request: Adderall  Last RX:07/26/17 Last OV:07/26/17 Next OV:10/27/17 UDS:11/30/16 CSC:11/30/16

## 2017-09-21 ENCOUNTER — Other Ambulatory Visit: Payer: Self-pay | Admitting: Medical

## 2017-09-22 MED ORDER — CLONAZEPAM 0.5 MG PO TABS: 0.5000 mg | ORAL_TABLET | Freq: Every day | ORAL | 0 refills | Status: DC

## 2017-09-22 NOTE — Telephone Encounter (Signed)
Requesting:Clonazepam 0.5mg  HS Contract: 2018 UDS: 11/30/16 Last OV: 07/26/17 Next Ov: 10/27/17 Last refill: 08/13/17, #30, 0RF Database: no discrepancies found.  Please advise.    Adderrall already filled 7/30.

## 2017-09-22 NOTE — Telephone Encounter (Signed)
Refilled clonazepam. Sent with imprivata.

## 2017-09-22 NOTE — Telephone Encounter (Signed)
Refill request: Clonazepam  Last RX:08/13/17 Last OV:07/26/17 Next OV:10/27/17 UDS:11/30/16 CSC:11/30/16

## 2017-09-28 ENCOUNTER — Telehealth: Payer: Self-pay | Admitting: Gastroenterology

## 2017-10-04 ENCOUNTER — Encounter: Payer: PRIVATE HEALTH INSURANCE | Admitting: Internal Medicine

## 2017-10-11 ENCOUNTER — Ambulatory Visit (AMBULATORY_SURGERY_CENTER): Payer: Self-pay

## 2017-10-11 VITALS — Ht 67.0 in | Wt 218.0 lb

## 2017-10-11 DIAGNOSIS — Z1211 Encounter for screening for malignant neoplasm of colon: Secondary | ICD-10-CM

## 2017-10-11 MED ORDER — NA SULFATE-K SULFATE-MG SULF 17.5-3.13-1.6 GM/177ML PO SOLN: 1.0000 | Freq: Once | ORAL | 0 refills | Status: AC

## 2017-10-11 NOTE — Progress Notes (Signed)
No egg or soy allergy known to patient  No issues with past sedation with any surgeries  or procedures, no intubation problems  No diet pills per patient No home 02 use per patient  No blood thinners per patient  Pt denies issues with constipation  No A fib or A flutter  EMMI video sent to pt's e mail sent to email  

## 2017-10-19 ENCOUNTER — Other Ambulatory Visit: Payer: Self-pay | Admitting: Medical

## 2017-10-19 NOTE — Telephone Encounter (Signed)
Will you run narx report on patient. Also have her schedule appointment next month for office visit before further refills given of controlled meds.

## 2017-10-19 NOTE — Telephone Encounter (Signed)
Refill Request: Clonazepam   Last RX:09/22/17 Last OV: 07/26/17 Next OV:10/27/17 UDS:11/30/16 CSC:11/30/16 CSR:

## 2017-10-25 ENCOUNTER — Encounter: Payer: PRIVATE HEALTH INSURANCE | Admitting: Gastroenterology

## 2017-10-25 NOTE — Telephone Encounter (Signed)
Pt has an appt on 10-27-2017 for future refills. Done.

## 2017-10-27 ENCOUNTER — Ambulatory Visit (INDEPENDENT_AMBULATORY_CARE_PROVIDER_SITE_OTHER): Payer: PRIVATE HEALTH INSURANCE | Admitting: Medical

## 2017-10-27 ENCOUNTER — Encounter: Payer: Self-pay | Admitting: Medical

## 2017-10-27 ENCOUNTER — Telehealth: Payer: Self-pay

## 2017-10-27 VITALS — BP 131/86 | HR 86 | Temp 98.2°F | Resp 16 | Ht 67.0 in | Wt 219.0 lb

## 2017-10-27 DIAGNOSIS — F329 Major depressive disorder, single episode, unspecified: Secondary | ICD-10-CM

## 2017-10-27 DIAGNOSIS — F909 Attention-deficit hyperactivity disorder, unspecified type: Secondary | ICD-10-CM | POA: Diagnosis not present

## 2017-10-27 DIAGNOSIS — F419 Anxiety disorder, unspecified: Secondary | ICD-10-CM | POA: Diagnosis not present

## 2017-10-27 DIAGNOSIS — F32A Depression, unspecified: Secondary | ICD-10-CM

## 2017-10-27 MED ORDER — AMPHETAMINE-DEXTROAMPHETAMINE 15 MG PO TABS: 1.0000 | ORAL_TABLET | Freq: Two times a day (BID) | ORAL | 0 refills | Status: DC

## 2017-10-27 MED ORDER — BUPROPION HCL ER (XL) 300 MG PO TB24: 300.0000 mg | ORAL_TABLET | Freq: Every day | ORAL | 3 refills | Status: DC

## 2017-10-27 NOTE — Progress Notes (Signed)
Subjective:    Patient ID: Abigail Wright, female    DOB: [birth year], 52 y.o.   MRN: [id number]  HPI  Pt in for follow up on mood and anxiety. She is doing well with both.   On adderral for ADD and on klonopin for anxiety.  Pt adderral was written on 09-23-2017. So she is due for refill.  Pt Klonopin rx was refilled on around 10-24-2017.  Pt trip to [place] upcoming on December 19, 2017. Will get back on 9th of November.  Also mood controlled with Wellbutrin.    Review of Systems  Constitutional: Negative for chills, fatigue and fever.  Respiratory: Negative for cough, chest tightness, shortness of breath and wheezing.   Cardiovascular: Negative for chest pain and palpitations.  Gastrointestinal: Negative for abdominal pain and blood in stool.  Musculoskeletal: Negative for back pain.  Skin: Negative for rash.  Hematological: Negative for adenopathy. Does not bruise/bleed easily.  Psychiatric/Behavioral: Positive for decreased concentration. Negative for behavioral problems, confusion, self-injury, sleep disturbance and suicidal ideas. The patient is nervous/anxious.        But controlled on meds.    Past Medical History:  Diagnosis Date  . ADD (attention deficit disorder)   . Anxiety   . Blood transfusion without reported diagnosis    1993  . Depression   . GERD (gastroesophageal reflux disease)    in past but recently controlled with diet and exercise.  Marland Kitchen Hyperlipidemia      Social History   Socioeconomic History  . Marital status: Married    Spouse name: Not on file  . Number of children: Not on file  . Years of education: Not on file  . Highest education level: Not on file  Occupational History  . Not on file  Social Needs  . Financial resource strain: Not on file  . Food insecurity:    Worry: Not on file    Inability: Not on file  . Transportation needs:    Medical: Not on file    Non-medical: Not on file  Tobacco Use  . Smoking status: Former Games developer  .  Smokeless tobacco: Never Used  Substance and Sexual Activity  . Alcohol use: Yes  . Drug use: No  . Sexual activity: Yes    Partners: Male    Birth control/protection: None  Lifestyle  . Physical activity:    Days per week: Not on file    Minutes per session: Not on file  . Stress: Not on file  Relationships  . Social connections:    Talks on phone: Not on file    Gets together: Not on file    Attends religious service: Not on file    Active member of club or organization: Not on file    Attends meetings of clubs or organizations: Not on file    Relationship status: Not on file  . Intimate partner violence:    Fear of current or ex partner: Not on file    Emotionally abused: Not on file    Physically abused: Not on file    Forced sexual activity: Not on file  Other Topics Concern  . Not on file  Social History Narrative  . Not on file    Past Surgical History:  Procedure Laterality Date  . ABDOMINAL HYSTERECTOMY    . BUNIONECTOMY Left   . CARPAL TUNNEL RELEASE Bilateral   . cyst removal from left hand Left   . FOOT SURGERY    . KNEE CARTILAGE SURGERY    .  planters fascitis    . TENDON REPAIR Left 03/21/2017   Left hand   . TONSILLECTOMY      Family History  Problem Relation Age of Onset  . Cancer Mother   . Diabetes Father   . Heart disease Paternal Uncle   . Hypertension Paternal Uncle   . Colon cancer Neg Hx   . Esophageal cancer Neg Hx   . Rectal cancer Neg Hx   . Stomach cancer Neg Hx     Not on File  Current Outpatient Medications on File Prior to Visit  Medication Sig Dispense Refill  . amphetamine-dextroamphetamine (ADDERALL) 15 MG tablet TAKE 1 TABLET BY MOUTH TWICE DAILY 60 tablet 0  . buPROPion (WELLBUTRIN XL) 300 MG 24 hr tablet Take 1 tablet (300 mg total) by mouth daily. 30 tablet 3  . ciprofloxacin (CIPRO) 500 MG tablet Take 1 tablet (500 mg total) by mouth 2 (two) times daily. 14 tablet 0  . clonazePAM (KLONOPIN) 0.5 MG tablet TAKE ONE  TABLET BY MOUTH AT BEDTIME 30 tablet 0  . fluticasone (FLONASE) 50 MCG/ACT nasal spray Place into the nose.    Marland Kitchen glucosamine-chondroitin 500-400 MG tablet Take by mouth.    . Lactobacillus (ACIDOPHILUS) 100 MG CAPS Take by mouth.    . minocycline (DYNACIN) 100 MG tablet Take 100 mg by mouth 2 (two) times daily.    . Multiple Vitamins-Minerals (HAIR SKIN & NAILS ADVANCED PO) Take by mouth.    . Multiple Vitamins-Minerals (MULTIVITAMIN ADULT EXTRA C PO) Take by mouth.    . Multiple Vitamins-Minerals (THERA-M) TABS Take by mouth.    . typhoid (VIVOTIF) DR capsule Take 1 capsule by mouth every other day. 4 capsule 0   No current facility-administered medications on file prior to visit.     BP 131/86   Pulse 86   Temp 98.2 F (36.8 C) (Oral)   Resp 16   Ht 5\' 7"  (1.702 m)   Wt 219 lb (99.3 kg)   SpO2 100%   BMI 34.30 kg/m       Objective:   Physical Exam  General Mental Status- Alert. General Appearance- Not in acute distress.   Skin General: Color- Normal Color. Moisture- Normal Moisture.  Neck Carotid Arteries- Normal color. Moisture- Normal Moisture. No carotid bruits. No JVD.  Chest and Lung Exam Auscultation: Breath Sounds:-Normal.  Cardiovascular Auscultation:Rythm- Regular. Murmurs & Other Heart Sounds:Auscultation of the heart reveals- No Murmurs.   Neurologic Cranial Nerve exam:- CN III-XII intact(No nystagmus), symmetric smile. Strength:- 5/5 equal and symmetric strength both upper and lower extremities.      Assessment & Plan:  Your chronic anxiety, depression and ADD are well controlled with current medication regimen.  I am going to ask staff if can to do urine drug screen today with the urine that you left.  If not we will let you know.  Also will ask that you contact me for referral request on the meds before you are due.  Also I want you to touch base with your pharmacist regarding if they would allow for early refill on your medications before  trip to [place].  In October or sometime before then will need you to sign controlled medication contract/renewal.  Follow-up in 4 months or as needed.  Note pt a urine in bathroom was not labled so I asked jasmine to notify pt that when she comes in to sign update controlled med contracts that I also want her to give another uds sample. Asking her  to do this since her urine today was not labled.

## 2017-10-27 NOTE — Patient Instructions (Addendum)
Your chronic anxiety, depression and ADD are well controlled with current medication regimen.  I am going to ask staff if we cando urine drug screen today with the urine that you left.  If not we will let you know.  Also will ask that you contact me for referral request on the meds before you are due.  Also I want you to touch base with your pharmacist regarding if they would allow for early refill on your medications before trip to [place].  In October or sometime before then will need you to sign controlled medication contract/renewal.  Follow-up in 4 months or as needed.

## 2017-10-27 NOTE — Telephone Encounter (Signed)
Pt states she was told she could do UDS in Nov when she comes back and Sign CSC. Pt states if she has to come come back to do it she will she just needs to make sure. Please advise.

## 2017-10-27 NOTE — Telephone Encounter (Signed)
I had advised that the contract and uds both are due in October. I was stating she has to come in anyway in October to sign contact(you were already at lunch). I had explained to her if urine was not labeled then would want her to give sample when she comes in to sign update contract. Both would be important to comply with law and since next prescription will be done a little early in light of upcoming trip to [place]. So if we are potententially filling med early before she leaves to [place] we need everything squared away per [place] laws.

## 2017-11-04 DIAGNOSIS — M25562 Pain in left knee: Secondary | ICD-10-CM | POA: Insufficient documentation

## 2017-11-19 ENCOUNTER — Encounter: Payer: Self-pay | Admitting: Medical

## 2017-11-19 ENCOUNTER — Other Ambulatory Visit: Payer: Self-pay | Admitting: Medical

## 2017-11-21 ENCOUNTER — Telehealth: Payer: Self-pay | Admitting: Medical

## 2017-11-21 NOTE — Telephone Encounter (Signed)
Requesting: Adderall 15mg  bid Contract: 2018   UDS: 11/2016 Last OV: 10/27/17 Next Ov: 02/27/2018 Last refill: 10/27/17, #60, 0RF Database: no discrepancies noted  Requesting: clonazepam Contract: 2018 UDS: 11/2016 Last OV: 10/27/17 Next Ov: 02/27/2018 Last refill:  10/19/17 Database: no discrepancies noted  Pt. Also requesting cipro refill prescribed on 07/26/17 for one week-long dose.   Orders pended. Please advise.

## 2017-11-21 NOTE — Telephone Encounter (Signed)
Let me know that she has come in and then will send the clonazepam, adderall and cipro.

## 2017-11-21 NOTE — Telephone Encounter (Signed)
I did discuss with pharmacist that I can send in 2 months supply of both adderal and clonazepam. I have plans on doing that but looks like she is due for uds in October and want her to go ahead and sign contract.So can you call her and get her to come in tomorrow sometime to update both. Otherwise She won't by in compliance when Oct 9,2019 comes up.

## 2017-11-22 ENCOUNTER — Other Ambulatory Visit: Payer: Self-pay | Admitting: Medical

## 2017-11-22 ENCOUNTER — Encounter: Payer: Self-pay | Admitting: Medical

## 2017-11-22 NOTE — Telephone Encounter (Signed)
Mychart message sent to pt.; pt. To call prior to arrival so CSC is ready for her to sign.

## 2017-11-22 NOTE — Telephone Encounter (Signed)
See my chart message

## 2017-11-22 NOTE — Telephone Encounter (Signed)
Notified pt. Pt states she will try to get in tomorrow for UDS and contract

## 2017-11-23 ENCOUNTER — Other Ambulatory Visit (INDEPENDENT_AMBULATORY_CARE_PROVIDER_SITE_OTHER): Payer: PRIVATE HEALTH INSURANCE

## 2017-11-23 ENCOUNTER — Telehealth: Payer: Self-pay | Admitting: Medical

## 2017-11-23 DIAGNOSIS — Z79899 Other long term (current) drug therapy: Secondary | ICD-10-CM

## 2017-11-23 MED ORDER — AMPHETAMINE-DEXTROAMPHETAMINE 15 MG PO TABS: 1.0000 | ORAL_TABLET | Freq: Two times a day (BID) | ORAL | 0 refills | Status: DC

## 2017-11-23 MED ORDER — CLONAZEPAM 0.5 MG PO TABS: 0.5000 mg | ORAL_TABLET | Freq: Every day | ORAL | 0 refills | Status: DC

## 2017-11-23 NOTE — Addendum Note (Signed)
Addended by: Gwenevere Abbot on: 11/23/2017 06:06 PM   Modules accepted: Orders

## 2017-11-23 NOTE — Telephone Encounter (Signed)
Pt came today for UDS and signed contract

## 2017-11-23 NOTE — Telephone Encounter (Signed)
Pt. Wants to know if she can drop off urine specimen for pain mgmt profile to [facility] [facility], and have her CSC e-mailed to her. Routed to [place], Research scientist (medical), and [place], [place], to advise.

## 2017-11-23 NOTE — Telephone Encounter (Signed)
Refilled pt adderall and klonopin. Gave 2 months supply since will be in [place] when due for next refill. I discussed with pharmacist and he verified I can do this. Only doing this under circumstances. Plan to get her back on montly regimen when back from [place].

## 2017-11-23 NOTE — Telephone Encounter (Signed)
Opened to review 

## 2017-11-23 NOTE — Telephone Encounter (Signed)
See mychart messages: pt. Wants to drop off urine sample at [facility] location and have CSC emailed to her. Routed to [place], [place], and [place], to advise.

## 2017-11-24 ENCOUNTER — Encounter: Payer: Self-pay | Admitting: Medical

## 2017-11-24 NOTE — Telephone Encounter (Signed)
I spoke with pt 11/21/17 Pt came in yesterday for UDS and Contract.

## 2017-11-24 NOTE — Telephone Encounter (Signed)
The specimen can only be dropped off at a [place] location. Im not sure what lab is being referred to and we don't know the computer system they use. The CSC should be signed in the office.

## 2017-11-25 LAB — PAIN MGMT, PROFILE 8 W/CONF, U
6 Acetylmorphine: NEGATIVE ng/mL (ref <10)
ALCOHOL METABOLITES: NEGATIVE ng/mL (ref <500)
AMPHETAMINE: 3067 ng/mL — AB (ref <250)
Amphetamines: POSITIVE ng/mL — AB (ref <500)
Benzodiazepines: NEGATIVE ng/mL (ref <100)
Buprenorphine, Urine: NEGATIVE ng/mL (ref <5)
COCAINE METABOLITE: NEGATIVE ng/mL (ref <150)
Creatinine: 41.1 mg/dL
MDMA: NEGATIVE ng/mL (ref <500)
Marijuana Metabolite: NEGATIVE ng/mL (ref <20)
Methamphetamine: NEGATIVE ng/mL (ref <250)
OXIDANT: NEGATIVE ug/mL (ref <200)
OXYCODONE: NEGATIVE ng/mL (ref <100)
Opiates: NEGATIVE ng/mL (ref <100)
pH: 6.61 (ref 4.5–9.0)

## 2017-11-25 MED ORDER — CIPROFLOXACIN HCL 500 MG PO TABS: 500.0000 mg | ORAL_TABLET | Freq: Two times a day (BID) | ORAL | 0 refills | Status: DC

## 2017-11-25 NOTE — Telephone Encounter (Signed)
Clonazepam and adderall refilled 10/2 by Clement Sayres, PA.

## 2017-11-25 NOTE — Telephone Encounter (Signed)
Rx cipro to pt pharamcy. For trip to [place]. Use if get travelers diarrhea type symptoms.

## 2017-11-29 ENCOUNTER — Ambulatory Visit (AMBULATORY_SURGERY_CENTER): Payer: PRIVATE HEALTH INSURANCE | Admitting: Gastroenterology

## 2017-11-29 ENCOUNTER — Encounter: Payer: Self-pay | Admitting: Gastroenterology

## 2017-11-29 VITALS — BP 132/76 | HR 76 | Temp 98.0°F | Resp 21 | Ht 67.0 in | Wt 218.0 lb

## 2017-11-29 DIAGNOSIS — Z1211 Encounter for screening for malignant neoplasm of colon: Secondary | ICD-10-CM

## 2017-11-29 MED ORDER — SODIUM CHLORIDE 0.9 % IV SOLN: 500.0000 mL | Freq: Once | INTRAVENOUS | Status: DC

## 2017-11-29 NOTE — Op Note (Signed)
[facility] Patient Name: Abigail Wright Procedure Date: 11/29/2017 8:07 AM MRN: [id number] Endoscopist: Justice Britain , MD Age: 52 Referring MD:  Date of Birth: September 29, 1965 Gender: Female Account #: [id number] Procedure:                Colonoscopy Indications:              Screening for colorectal malignant neoplasm, This                            is the patient's first colonoscopy Medicines:                Monitored Anesthesia Care Procedure:                Pre-Anesthesia Assessment:                           - Prior to the procedure, a History and Physical                            was performed, and patient medications and                            allergies were reviewed. The patient's tolerance of                            previous anesthesia was also reviewed. The risks                            and benefits of the procedure and the sedation                            options and risks were discussed with the patient.                            All questions were answered, and informed consent                            was obtained. Prior Anticoagulants: The patient has                            taken no previous anticoagulant or antiplatelet                            agents. ASA Grade Assessment: II - A patient with                            mild systemic disease. After reviewing the risks                            and benefits, the patient was deemed in                            satisfactory condition to undergo the procedure.  After obtaining informed consent, the colonoscope                            was passed under direct vision. Throughout the                            procedure, the patient's blood pressure, pulse, and                            oxygen saturations were monitored continuously. The                            Model CF-HQ190L [id number]) scope was introduced                            through the anus and  advanced to the 5 cm into the                            ileum. The colonoscopy was performed without                            difficulty. The patient tolerated the procedure.                            The quality of the bowel preparation was evaluated                            using the [facility] Bowel Preparation Scale)                            with scores of: Right Colon = 3, Transverse Colon =                            3 and Left Colon = 3 (entire mucosa seen well with                            no residual staining, small fragments of stool or                            opaque liquid). The total BBPS score equals 9. Scope In: 8:09:57 AM Scope Out: 8:24:39 AM Scope Withdrawal Time: 0 hours 9 minutes 43 seconds  Total Procedure Duration: 0 hours 14 minutes 42 seconds  Findings:                 The digital rectal exam findings include                            non-thrombosed external hemorrhoids. Pertinent                            negatives include no palpable rectal lesions.  The terminal ileum and ileocecal valve appeared                            normal.                           Many small and large-mouthed diverticula were found                            in the recto-sigmoid colon, sigmoid colon and                            descending colon.                           Normal mucosa was found in the entire colon                            otherwise.                           Non-bleeding non-thrombosed external and internal                            hemorrhoids were found during retroflexion and                            during perianal exam. The hemorrhoids were Grade II                            (internal hemorrhoids that prolapse but reduce                            spontaneously). Complications:            No immediate complications. Estimated Blood Loss:     Estimated blood loss: none. Impression:               - Non-thrombosed  external hemorrhoids found on                            digital rectal exam.                           - The examined portion of the ileum was normal.                           - Diverticulosis in the recto-sigmoid colon, in the                            sigmoid colon and in the descending colon.                           - Otherwise, normal mucosa in the entire examined                            colon.                           -  Non-bleeding non-thrombosed external and internal                            hemorrhoids. Recommendation:           - The patient will be observed post-procedure,                            until all discharge criteria are met.                           - Discharge patient to home.                           - Patient has a contact number available for                            emergencies. The signs and symptoms of potential                            delayed complications were discussed with the                            patient. Return to normal activities tomorrow.                            Written discharge instructions were provided to the                            patient.                           - Resume previous diet.                           - Continue present medications.                           - Repeat colonoscopy in 10 years for screening                            purposes, unless changes in bowel habits or                            bleeding were to occur that may require earlier                            repeat evaluation.                           - The findings and recommendations were discussed                            with the patient.                           - The findings and recommendations were discussed  with the patient's family. Justice Britain, MD 11/29/2017 8:30:55 AM

## 2017-11-29 NOTE — Progress Notes (Signed)
Report given to PACU, vss 

## 2017-11-29 NOTE — Patient Instructions (Addendum)
INFORMATION ON DIVERTICULOSIS GIVEN.  YOU HAD AN ENDOSCOPIC PROCEDURE TODAY AT THE [facility]:   Refer to the procedure report that was given to you for any specific questions about what was found during the examination.  If the procedure report does not answer your questions, please call your gastroenterologist to clarify.  If you requested that your care partner not be given the details of your procedure findings, then the procedure report has been included in a sealed envelope for you to review at your convenience later.  YOU SHOULD EXPECT: Some feelings of bloating in the abdomen. Passage of more gas than usual.  Walking can help get rid of the air that was put into your GI tract during the procedure and reduce the bloating. If you had a lower endoscopy (such as a colonoscopy or flexible sigmoidoscopy) you may notice spotting of blood in your stool or on the toilet paper. If you underwent a bowel prep for your procedure, you may not have a normal bowel movement for a few days.  Please Note:  You might notice some irritation and congestion in your nose or some drainage.  This is from the oxygen used during your procedure.  There is no need for concern and it should clear up in a day or so.  SYMPTOMS TO REPORT IMMEDIATELY:   Following lower endoscopy (colonoscopy or flexible sigmoidoscopy):  Excessive amounts of blood in the stool  Significant tenderness or worsening of abdominal pains  Swelling of the abdomen that is new, acute  Fever of 100F or higher   For urgent or emergent issues, a gastroenterologist can be reached at any hour by calling [phone].   DIET:  We do recommend a small meal at first, but then you may proceed to your regular diet.  Drink plenty of fluids but you should avoid alcoholic beverages for 24 hours.  ACTIVITY:  You should plan to take it easy for the rest of today and you should NOT DRIVE or use heavy machinery until tomorrow (because of the  sedation medicines used during the test).    FOLLOW UP: Our staff will call the number listed on your records the next business day following your procedure to check on you and address any questions or concerns that you may have regarding the information given to you following your procedure. If we do not reach you, we will leave a message.  However, if you are feeling well and you are not experiencing any problems, there is no need to return our call.  We will assume that you have returned to your regular daily activities without incident.  If any biopsies were taken you will be contacted by phone or by letter within the next 1-3 weeks.  Please call us at [phone] if you have not heard about the biopsies in 3 weeks.    SIGNATURES/CONFIDENTIALITY: You and/or your care partner have signed paperwork which will be entered into your electronic medical record.  These signatures attest to the fact that that the information above on your After Visit Summary has been reviewed and is understood.  Full responsibility of the confidentiality of this discharge information lies with you and/or your care-partner. 

## 2017-11-30 ENCOUNTER — Telehealth: Payer: Self-pay | Admitting: *Deleted

## 2017-11-30 NOTE — Telephone Encounter (Signed)
  Follow up Call-  Call back number 11/29/2017  Post procedure Call Back phone  # [phone]  Permission to leave phone message Yes     Patient questions:  Do you have a fever, pain , or abdominal swelling? No. Pain Score  0 *  Have you tolerated food without any problems? Yes.    Have you been able to return to your normal activities? Yes.    Do you have any questions about your discharge instructions: Diet   No. Medications  No. Follow up visit  No.  Do you have questions or concerns about your Care? No.  Actions: * If pain score is 4 or above: No action needed, pain <4.

## 2018-01-16 ENCOUNTER — Other Ambulatory Visit: Payer: Self-pay | Admitting: Medical

## 2018-01-17 MED ORDER — AMPHETAMINE-DEXTROAMPHETAMINE 15 MG PO TABS: 1.0000 | ORAL_TABLET | Freq: Two times a day (BID) | ORAL | 0 refills | Status: DC

## 2018-01-17 MED ORDER — CLONAZEPAM 0.5 MG PO TABS: 0.5000 mg | ORAL_TABLET | Freq: Every day | ORAL | 1 refills | Status: DC

## 2018-01-17 NOTE — Telephone Encounter (Signed)
  Refilled pt adderral and klonopin.

## 2018-01-18 ENCOUNTER — Encounter: Payer: Self-pay | Admitting: Medical

## 2018-01-18 ENCOUNTER — Telehealth: Payer: Self-pay | Admitting: Medical

## 2018-01-18 NOTE — Telephone Encounter (Signed)
Ok'd to refill clonazepam early since she is leaving town for Thanksgiving.Talked with pharmacist

## 2018-02-27 ENCOUNTER — Ambulatory Visit: Payer: PRIVATE HEALTH INSURANCE | Admitting: Medical

## 2018-02-28 ENCOUNTER — Ambulatory Visit: Payer: PRIVATE HEALTH INSURANCE | Admitting: Medical

## 2018-05-16 ENCOUNTER — Telehealth: Payer: Self-pay | Admitting: *Deleted

## 2018-05-16 NOTE — Telephone Encounter (Signed)
Received Mammogram results from [facility]; forwarded to provider/SLS 03/24

## 2018-06-21 ENCOUNTER — Telehealth: Payer: Self-pay | Admitting: *Deleted

## 2018-06-21 NOTE — Telephone Encounter (Signed)
Pt last seen by PCP 10/2017 and is past due for follow up of anxiety/depression and ADD. Left detailed message to call the office to schedule Virtual follow up.

## 2018-07-28 NOTE — Telephone Encounter (Signed)
Pt has not read mychart message. Mailed letter. 

## 2018-11-16 ENCOUNTER — Other Ambulatory Visit: Payer: Self-pay

## 2018-11-16 ENCOUNTER — Other Ambulatory Visit: Payer: Self-pay | Admitting: Sports Medicine

## 2018-11-16 DIAGNOSIS — M79671 Pain in right foot: Secondary | ICD-10-CM

## 2018-11-17 ENCOUNTER — Other Ambulatory Visit: Payer: Self-pay

## 2018-11-17 ENCOUNTER — Encounter: Payer: Self-pay | Admitting: Sports Medicine

## 2018-11-17 ENCOUNTER — Ambulatory Visit (INDEPENDENT_AMBULATORY_CARE_PROVIDER_SITE_OTHER): Payer: PRIVATE HEALTH INSURANCE | Admitting: Sports Medicine

## 2018-11-17 ENCOUNTER — Ambulatory Visit (INDEPENDENT_AMBULATORY_CARE_PROVIDER_SITE_OTHER): Payer: PRIVATE HEALTH INSURANCE

## 2018-11-17 DIAGNOSIS — M79671 Pain in right foot: Secondary | ICD-10-CM | POA: Diagnosis not present

## 2018-11-17 DIAGNOSIS — M7661 Achilles tendinitis, right leg: Secondary | ICD-10-CM

## 2018-11-17 DIAGNOSIS — M25571 Pain in right ankle and joints of right foot: Secondary | ICD-10-CM

## 2018-11-17 DIAGNOSIS — B351 Tinea unguium: Secondary | ICD-10-CM

## 2018-11-17 MED ORDER — PREDNISONE 10 MG (21) PO TBPK: ORAL_TABLET | ORAL | 0 refills | Status: DC

## 2018-11-17 MED ORDER — MELOXICAM 15 MG PO TABS: 15.0000 mg | ORAL_TABLET | Freq: Every day | ORAL | 0 refills | Status: DC

## 2018-11-17 NOTE — Patient Instructions (Signed)

## 2018-11-17 NOTE — Progress Notes (Addendum)
Subjective: Abigail Wright is a 53 y.o. female patient who presents to office for evaluation of Right heel pain. Patient complains of progressive pain especially over the last year in the Right 4 months at the Achilles. Ranks pain 6/10 and is now interferring with daily activities.  States that the pain is stabbing and burning with some swelling noted to the back of the heel worse with close back shoes reports that she has tried ibuprofen stretching resting ice elevation without any complete improvement reports that when she was actually active with exercising before COVID started she was doing great where there was a little bit of pain but now that she has stopped exercising the pain has actually worsened.  Patient also reports that she has noticed that her left toenails especially the second toenail has started to become discolored and thickened on the ins and is worried about fungus so 2 weeks ago she started using funginail but it has not helped.  Patient denies any other pedal complaints.   Review of Systems  All other systems reviewed and are negative.   Patient Active Problem List   Diagnosis Date Noted  . Pain in left knee 11/04/2017  . Benign paroxysmal positional vertigo of left ear 05/15/2015  . Family history of breast cancer in mother 08/02/2011    Current Outpatient Medications on File Prior to Visit  Medication Sig Dispense Refill  . amphetamine-dextroamphetamine (ADDERALL) 15 MG tablet Take 1 tablet by mouth 2 (two) times daily. Refill when due/not sooner. 60 tablet 0  . buPROPion (WELLBUTRIN XL) 300 MG 24 hr tablet Take 1 tablet (300 mg total) by mouth daily. 30 tablet 3  . clonazePAM (KLONOPIN) 0.5 MG tablet Take 1 tablet (0.5 mg total) by mouth at bedtime. 30 tablet 1  . fluticasone (FLONASE) 50 MCG/ACT nasal spray Place into the nose.    Marland Kitchen glucosamine-chondroitin 500-400 MG tablet Take by mouth.    . Multiple Vitamins-Minerals (MULTIVITAMIN ADULT EXTRA C PO) Take by mouth.      No current facility-administered medications on file prior to visit.     Not on File  Objective:  General: Alert and oriented x3 in no acute distress  Dermatology: No open lesions bilateral lower extremities, no webspace macerations, no ecchymosis bilateral, all nails x 10 are well manicured but the left first second and fifth toenails have some distal thickening and mild discoloration.  Vascular: Dorsalis Pedis and Posterior Tibial pedal pulses 2/4, Capillary Fill Time 3 seconds, + pedal hair growth bilateral, no edema bilateral lower extremities, Temperature gradient within normal limits.  Neurology: Johney Maine sensation intact via light touch bilateral.  Musculoskeletal: Mild tenderness with palpation at insertion of the Achilles on Right, there is calcaneal exostosis with mild soft tissue present and decreased ankle rom with knee extending  vs flexed resembling gastroc equnius bilateral, The achilles tendon feels intact with no nodularity or palpable dell, Thompson sign negative, Subtalar and midtarsal joint range of motion is within normal limits, there is no 1st ray hypermobility or mild bunion and hammertoe deformity noted bilateral.   Gait: Antalgic gait with increased heel off right.  Xrays  Right foot   Impression: Normal osseous mineralization. Joint spaces preserved. No fracture/dislocation/boney destruction. Calcaneal spurs present. Kager's triangle intact with no obliteration. No soft tissue abnormalities or radiopaque foreign bodies.   Assessment and Plan: Problem List Items Addressed This Visit    None    Visit Diagnoses    Tendonitis, Achilles, right    -  Primary  Relevant Medications   predniSONE (STERAPRED UNI-PAK 21 TAB) 10 MG (21) TBPK tablet   meloxicam (MOBIC) 15 MG tablet   Right ankle pain, unspecified chronicity       Nail fungus          -Complete examination performed -Xrays reviewed -Discussed treatement options for right Achilles tendinitis and  possible early nail changes consistent with fungus on left -Advised patient to use night splint that she already has at home -Rx prednisone Dosepak and meloxicam to take as instructed -Recommend gentle active stretching and using ice at the end of the day -Dispensed heel lifts for patient to use bilateral when in shoes -No improvement will consider MRI/PT/EPAT -Patient declined fungal culture at this time for the nails on her left foot I recommended to patient to continue with daily use of Funginail and to give this medication more time to work may take upwards to 1 year -Patient to return to office 3 to 4 weeks or sooner if condition worsens.  Asencion Islam, DPM

## 2018-11-21 ENCOUNTER — Other Ambulatory Visit: Payer: Self-pay

## 2018-11-21 ENCOUNTER — Ambulatory Visit (INDEPENDENT_AMBULATORY_CARE_PROVIDER_SITE_OTHER): Payer: PRIVATE HEALTH INSURANCE | Admitting: Adult Health

## 2018-11-21 DIAGNOSIS — F411 Generalized anxiety disorder: Secondary | ICD-10-CM | POA: Diagnosis not present

## 2018-11-21 DIAGNOSIS — F331 Major depressive disorder, recurrent, moderate: Secondary | ICD-10-CM | POA: Diagnosis not present

## 2018-11-21 DIAGNOSIS — F909 Attention-deficit hyperactivity disorder, unspecified type: Secondary | ICD-10-CM

## 2018-11-22 ENCOUNTER — Other Ambulatory Visit: Payer: Self-pay

## 2018-11-22 ENCOUNTER — Encounter: Payer: Self-pay | Admitting: Adult Health

## 2018-11-22 ENCOUNTER — Ambulatory Visit (INDEPENDENT_AMBULATORY_CARE_PROVIDER_SITE_OTHER): Payer: PRIVATE HEALTH INSURANCE | Admitting: Adult Health

## 2018-11-22 ENCOUNTER — Other Ambulatory Visit: Payer: Self-pay | Admitting: Sports Medicine

## 2018-11-22 DIAGNOSIS — F909 Attention-deficit hyperactivity disorder, unspecified type: Secondary | ICD-10-CM

## 2018-11-22 DIAGNOSIS — F411 Generalized anxiety disorder: Secondary | ICD-10-CM

## 2018-11-22 DIAGNOSIS — F331 Major depressive disorder, recurrent, moderate: Secondary | ICD-10-CM

## 2018-11-22 DIAGNOSIS — M79671 Pain in right foot: Secondary | ICD-10-CM

## 2018-11-22 DIAGNOSIS — M7661 Achilles tendinitis, right leg: Secondary | ICD-10-CM

## 2018-11-22 MED ORDER — CLONAZEPAM 0.5 MG PO TABS: 0.5000 mg | ORAL_TABLET | Freq: Every day | ORAL | 0 refills | Status: DC

## 2018-11-22 MED ORDER — CITALOPRAM HYDROBROMIDE 20 MG PO TABS: 20.0000 mg | ORAL_TABLET | Freq: Every day | ORAL | 1 refills | Status: DC

## 2018-11-22 MED ORDER — AMPHETAMINE-DEXTROAMPHETAMINE 30 MG PO TABS: 30.0000 mg | ORAL_TABLET | Freq: Two times a day (BID) | ORAL | 0 refills | Status: DC

## 2018-11-22 MED ORDER — BUPROPION HCL ER (XL) 300 MG PO TB24: 300.0000 mg | ORAL_TABLET | Freq: Every day | ORAL | 1 refills | Status: DC

## 2018-11-22 NOTE — Progress Notes (Signed)
Abigail Wright [id number] [birth year] 53 y.o.  Virtual Visit via Telephone Note  I connected with pt on 11/22/18 at  1:00 PM EDT by telephone and verified that I am speaking with the correct person using two identifiers.   I discussed the limitations, risks, security and privacy concerns of performing an evaluation and management service by telephone and the availability of in person appointments. I also discussed with the patient that there may be a patient responsible charge related to this service. The patient expressed understanding and agreed to proceed.   I discussed the assessment and treatment plan with the patient. The patient was provided an opportunity to ask questions and all were answered. The patient agreed with the plan and demonstrated an understanding of the instructions.   The patient was advised to call back or seek an in-person evaluation if the symptoms worsen or if the condition fails to improve as anticipated.  I provided 30 minutes of non-face-to-face time during this encounter.  The patient was located at home.  [facility]   Dorothyann Gibbsegina N Deshawn Skelley, NP   Subjective:   Patient ID:  Abigail Wright is a 53 y.o. (DOB [birth year]) female.  Chief Complaint:  Chief Complaint  Patient presents with  . ADHD  . Anxiety  . Depression    HPI Abigail Wright presents for follow-up of anxiety, ADHD, and depression.  Describes mood today as "ok". Pleasant. Mood symptoms - denies depression, anxiety, and irritability. Stating "I'm doing pretty good right now". Stable interest and motivation. Taking medications as prescribed.  Energy levels stable. Active, does not have a regular exercise routine. Works full-time. 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   Enjoys some usual interests and activities. Spending time with family - husband. 2 daughters locally.  Appetite adequate. Weight stable. Sleeps well most nights. Averages 6 to 8  hours. Focus and concentration stable. Completing tasks. Managing aspects of household.  Denies SI or HI. Denies AH or VH.  Review of Systems:  Review of Systems  Musculoskeletal: Negative for gait problem.  Neurological: Negative for tremors.  Psychiatric/Behavioral:       Please refer to HPI    Medications: I have reviewed the patient's current medications.  Current Outpatient Medications  Medication Sig Dispense Refill  . amphetamine-dextroamphetamine (ADDERALL) 15 MG tablet Take 1 tablet by mouth 2 (two) times daily. Refill when due/not sooner. 60 tablet 0  . amphetamine-dextroamphetamine (ADDERALL) 30 MG tablet Take 1 tablet by mouth 2 (two) times daily. 60 tablet 0  . [START ON 12/20/2018] amphetamine-dextroamphetamine (ADDERALL) 30 MG tablet Take 1 tablet by mouth 2 (two) times daily. 60 tablet 0  . [START ON 01/17/2019] amphetamine-dextroamphetamine (ADDERALL) 30 MG tablet Take 1 tablet by mouth 2 (two) times daily. 60 tablet 0  . buPROPion (WELLBUTRIN XL) 300 MG 24 hr tablet Take 1 tablet (300 mg total) by mouth daily. 90 tablet 1  . citalopram (CELEXA) 20 MG tablet Take 1 tablet (20 mg total) by mouth daily. 90 tablet 1  . clonazePAM (KLONOPIN) 0.5 MG tablet Take 1 tablet (0.5 mg total) by mouth at bedtime. 90 tablet 0  . fluticasone (FLONASE) 50 MCG/ACT nasal spray Place into the nose.    Marland Kitchen. glucosamine-chondroitin 500-400 MG tablet Take by mouth.    . meloxicam (MOBIC) 15 MG tablet Take 1 tablet (15 mg total) by mouth daily. 30 tablet 0  . Multiple Vitamins-Minerals (MULTIVITAMIN ADULT EXTRA C PO) Take by mouth.    .Marland Kitchen  predniSONE (STERAPRED UNI-PAK 21 TAB) 10 MG (21) TBPK tablet Take as directed 21 tablet 0   No current facility-administered medications for this visit.     Medication Side Effects: None  Allergies: No Known Allergies  Past Medical History:  Diagnosis Date  . ADD (attention deficit disorder)   . Anxiety   . Blood transfusion without reported diagnosis     1993  . Depression   . GERD (gastroesophageal reflux disease)    in past but recently controlled with diet and exercise.  Marland Kitchen Hyperlipidemia     Family History  Problem Relation Age of Onset  . Cancer Mother   . Diabetes Father   . Heart disease Paternal Uncle   . Hypertension Paternal Uncle   . Colon cancer Neg Hx   . Esophageal cancer Neg Hx   . Rectal cancer Neg Hx   . Stomach cancer Neg Hx     Social History   Socioeconomic History  . Marital status: Married    Spouse name: Not on file  . Number of children: Not on file  . Years of education: Not on file  . Highest education level: Not on file  Occupational History  . Not on file  Social Needs  . Financial resource strain: Not on file  . Food insecurity    Worry: Not on file    Inability: Not on file  . Transportation needs    Medical: Not on file    Non-medical: Not on file  Tobacco Use  . Smoking status: Former Research scientist (life sciences)  . Smokeless tobacco: Never Used  Substance and Sexual Activity  . Alcohol use: Yes  . Drug use: No  . Sexual activity: Yes    Partners: Male    Birth control/protection: None, Post-menopausal  Lifestyle  . Physical activity    Days per week: Not on file    Minutes per session: Not on file  . Stress: Not on file  Relationships  . Social Herbalist on phone: Not on file    Gets together: Not on file    Attends religious service: Not on file    Active member of club or organization: Not on file    Attends meetings of clubs or organizations: Not on file    Relationship status: Not on file  . Intimate partner violence    Fear of current or ex partner: Not on file    Emotionally abused: Not on file    Physically abused: Not on file    Forced sexual activity: Not on file  Other Topics Concern  . Not on file  Social History Narrative  . Not on file    Past Medical History, Surgical history, Social history, and Family history were reviewed and updated as appropriate.    Please see review of systems for further details on the patient's review from today.   Objective:   Physical Exam:  There were no vitals taken for this visit.  Physical Exam Constitutional:      General: She is not in acute distress.    Appearance: She is well-developed.  Musculoskeletal:        General: No deformity.  Neurological:     Mental Status: She is alert and oriented to person, place, and time.     Coordination: Coordination normal.  Psychiatric:        Attention and Perception: Attention and perception normal. She does not perceive auditory or visual hallucinations.  Mood and Affect: Mood normal. Mood is not anxious or depressed. Affect is not labile, blunt, angry or inappropriate.        Speech: Speech normal.        Behavior: Behavior normal.        Thought Content: Thought content normal. Thought content is not paranoid or delusional. Thought content does not include homicidal or suicidal ideation. Thought content does not include homicidal or suicidal plan.        Cognition and Memory: Cognition and memory normal.        Judgment: Judgment normal.     Comments: Insight intact     Lab Review:     Component Value Date/Time   NA 140 07/26/2017 0903   K 4.7 07/26/2017 0903   CL 102 07/26/2017 0903   CO2 31 07/26/2017 0903   GLUCOSE 95 07/26/2017 0903   BUN 13 07/26/2017 0903   CREATININE 0.74 07/26/2017 0903   CALCIUM 9.6 07/26/2017 0903   PROT 6.5 07/26/2017 0903   ALBUMIN 4.3 07/26/2017 0903   AST 18 07/26/2017 0903   ALT 19 07/26/2017 0903   ALKPHOS 52 07/26/2017 0903   BILITOT 0.6 07/26/2017 0903       Component Value Date/Time   WBC 5.5 07/26/2017 0903   RBC 4.92 07/26/2017 0903   HGB 14.5 07/26/2017 0903   HCT 43.4 07/26/2017 0903   PLT 292.0 07/26/2017 0903   MCV 88.3 07/26/2017 0903   MCHC 33.4 07/26/2017 0903   RDW 13.8 07/26/2017 0903    No results found for: POCLITH, LITHIUM   No results found for: PHENYTOIN, PHENOBARB,  VALPROATE, CBMZ   .res Assessment: Plan:    Plan:  1. Adderall  BID 2. Celexa 20 mg daily 3. Wellbutrin XL  4. Clonazepam 0.5mg  daily   RTC 4 weeks  Patient advised to contact office with any questions, adverse effects, or acute worsening in signs and symptoms.  Discussed potential benefits, risk, and side effects of benzodiazepines to include potential risk of tolerance and dependence, as well as possible drowsiness.  Advised patient not to drive if experiencing drowsiness and to take lowest possible effective dose to minimize risk of dependence and tolerance.  Discussed potential benefits, risks, and side effects of stimulants with patient to include increased heart rate, palpitations, insomnia, increased anxiety, increased irritability, or decreased appetite.  Instructed patient to contact office if experiencing any significant tolerability issues.  Meeah was seen today for adhd, anxiety and depression.  Diagnoses and all orders for this visit:  Major depressive disorder, recurrent episode, moderate (HCC) -     buPROPion (WELLBUTRIN XL) 300 MG 24 hr tablet; Take 1 tablet (300 mg total) by mouth daily. -     citalopram (CELEXA) 20 MG tablet; Take 1 tablet (20 mg total) by mouth daily. -     amphetamine-dextroamphetamine (ADDERALL) 30 MG tablet; Take 1 tablet by mouth 2 (two) times daily. -     amphetamine-dextroamphetamine (ADDERALL) 30 MG tablet; Take 1 tablet by mouth 2 (two) times daily. -     amphetamine-dextroamphetamine (ADDERALL) 30 MG tablet; Take 1 tablet by mouth 2 (two) times daily.  Generalized anxiety disorder -     buPROPion (WELLBUTRIN XL) 300 MG 24 hr tablet; Take 1 tablet (300 mg total) by mouth daily. -     clonazePAM (KLONOPIN) 0.5 MG tablet; Take 1 tablet (0.5 mg total) by mouth at bedtime. -     citalopram (CELEXA) 20 MG tablet; Take 1 tablet (20 mg  total) by mouth daily.  Attention deficit hyperactivity disorder (ADHD), unspecified ADHD type     Please see After Visit Summary for patient specific instructions.  Future Appointments  Date Time Provider Department Center  12/15/2018  1:15 PM Asencion Islam, DPM [facility]    No orders of the defined types were placed in this encounter.     -------------------------------

## 2018-12-15 ENCOUNTER — Other Ambulatory Visit: Payer: Self-pay

## 2018-12-15 ENCOUNTER — Encounter: Payer: Self-pay | Admitting: Sports Medicine

## 2018-12-15 ENCOUNTER — Ambulatory Visit (INDEPENDENT_AMBULATORY_CARE_PROVIDER_SITE_OTHER): Payer: PRIVATE HEALTH INSURANCE | Admitting: Sports Medicine

## 2018-12-15 DIAGNOSIS — M7661 Achilles tendinitis, right leg: Secondary | ICD-10-CM | POA: Diagnosis not present

## 2018-12-15 DIAGNOSIS — M25571 Pain in right ankle and joints of right foot: Secondary | ICD-10-CM

## 2018-12-15 DIAGNOSIS — B351 Tinea unguium: Secondary | ICD-10-CM

## 2018-12-15 NOTE — Progress Notes (Signed)
Subjective: Abigail Wright is a 53 y.o. female patient who returns to office for follow up evaluation of Right heel pain. Patient reports that it feels better at the Achilles. Ranks pain 4/10 and has noticed some improvement with night splint and mobic. Reports that at 1st it was going to use the splint feels much better.  Patient reports that she is still using fungal nail for her nail fungus but does not want me to remove her nail polish today states she just polished her toenails.  No other pedal complaints noted.  Patient Active Problem List   Diagnosis Date Noted  . Pain in left knee 11/04/2017  . Benign paroxysmal positional vertigo of left ear 05/15/2015  . Family history of breast cancer in mother 08/02/2011    Current Outpatient Medications on File Prior to Visit  Medication Sig Dispense Refill  . amphetamine-dextroamphetamine (ADDERALL) 15 MG tablet Take 1 tablet by mouth 2 (two) times daily. Refill when due/not sooner. 60 tablet 0  . amphetamine-dextroamphetamine (ADDERALL) 30 MG tablet Take 1 tablet by mouth 2 (two) times daily. 60 tablet 0  . [START ON 12/20/2018] amphetamine-dextroamphetamine (ADDERALL) 30 MG tablet Take 1 tablet by mouth 2 (two) times daily. 60 tablet 0  . [START ON 01/17/2019] amphetamine-dextroamphetamine (ADDERALL) 30 MG tablet Take 1 tablet by mouth 2 (two) times daily. 60 tablet 0  . buPROPion (WELLBUTRIN XL) 300 MG 24 hr tablet Take 1 tablet (300 mg total) by mouth daily. 90 tablet 1  . citalopram (CELEXA) 20 MG tablet Take 1 tablet (20 mg total) by mouth daily. 90 tablet 1  . clonazePAM (KLONOPIN) 0.5 MG tablet Take 1 tablet (0.5 mg total) by mouth at bedtime. 90 tablet 0  . fluticasone (FLONASE) 50 MCG/ACT nasal spray Place into the nose.    Marland Kitchen glucosamine-chondroitin 500-400 MG tablet Take by mouth.    . meloxicam (MOBIC) 15 MG tablet Take 1 tablet (15 mg total) by mouth daily. 30 tablet 0  . Multiple Vitamins-Minerals (MULTIVITAMIN ADULT EXTRA C PO) Take  by mouth.    . predniSONE (STERAPRED UNI-PAK 21 TAB) 10 MG (21) TBPK tablet Take as directed 21 tablet 0   No current facility-administered medications on file prior to visit.     No Known Allergies  Objective:  General: Alert and oriented x3 in no acute distress  Dermatology: No open lesions bilateral lower extremities, no webspace macerations, no ecchymosis bilateral, all nails x 10 are well manicured but the left first second and fifth toenails have some distal thickening and mild discoloration like before but currently covered with nail polish.  Vascular: Dorsalis Pedis and Posterior Tibial pedal pulses 2/4, Capillary Fill Time 3 seconds, + pedal hair growth bilateral, no edema bilateral lower extremities, Temperature gradient within normal limits.  Neurology: Michaell Cowing sensation intact via light touch bilateral.  Musculoskeletal: Decreased tenderness with palpation at insertion of the Achilles on Right, there is calcaneal exostosis with mild soft tissue present and decreased ankle rom with knee extending  vs flexed resembling gastroc equnius bilateral, The achilles tendon feels intact with no nodularity or palpable dell, Thompson sign negative, Subtalar and midtarsal joint range of motion is within normal limits, there is no 1st ray hypermobility or mild bunion and hammertoe deformity noted bilateral.   Assessment and Plan: Problem List Items Addressed This Visit    None    Visit Diagnoses    Tendonitis, Achilles, right    -  Primary   Right ankle pain, unspecified chronicity  Nail fungus          -Complete examination performed -Rediscussed continued care for Achilles tendinitis -Continue with night splint -Recommend icing; dispensed ice pack -Advised good supportive shoes that do not rub or irritate back of heel -Continue with meloxicam until completed -Continue with funginail to toenails and encourage rest from nail polish -No improvement will consider cam boot after  injection/PT/EPAT -Patient to return to office as needed or if flare occurs.  Landis Martins, DPM

## 2019-02-26 ENCOUNTER — Ambulatory Visit (INDEPENDENT_AMBULATORY_CARE_PROVIDER_SITE_OTHER): Payer: PRIVATE HEALTH INSURANCE | Admitting: Adult Health

## 2019-02-26 ENCOUNTER — Encounter: Payer: Self-pay | Admitting: Adult Health

## 2019-02-26 DIAGNOSIS — F909 Attention-deficit hyperactivity disorder, unspecified type: Secondary | ICD-10-CM

## 2019-02-26 DIAGNOSIS — F331 Major depressive disorder, recurrent, moderate: Secondary | ICD-10-CM | POA: Diagnosis not present

## 2019-02-26 DIAGNOSIS — F411 Generalized anxiety disorder: Secondary | ICD-10-CM

## 2019-02-26 MED ORDER — AMPHETAMINE-DEXTROAMPHETAMINE 30 MG PO TABS: 30.0000 mg | ORAL_TABLET | Freq: Two times a day (BID) | ORAL | 0 refills | Status: DC

## 2019-02-26 MED ORDER — CITALOPRAM HYDROBROMIDE 20 MG PO TABS: 20.0000 mg | ORAL_TABLET | Freq: Every day | ORAL | 1 refills | Status: DC

## 2019-02-26 MED ORDER — BUPROPION HCL ER (XL) 300 MG PO TB24: 300.0000 mg | ORAL_TABLET | Freq: Every day | ORAL | 1 refills | Status: DC

## 2019-02-26 MED ORDER — CLONAZEPAM 0.5 MG PO TABS: 0.5000 mg | ORAL_TABLET | Freq: Every day | ORAL | 0 refills | Status: DC

## 2019-02-26 NOTE — Progress Notes (Signed)
Abigail Wright [id number] April 21, 1965 54 y.o.  Virtual Visit via Telephone Note  I connected with pt on 02/26/19 at  1:00 PM EST by telephone and verified that I am speaking with the correct person using two identifiers.   I discussed the limitations, risks, security and privacy concerns of performing an evaluation and management service by telephone and the availability of in person appointments. I also discussed with the patient that there may be a patient responsible charge related to this service. The patient expressed understanding and agreed to proceed.   I discussed the assessment and treatment plan with the patient. The patient was provided an opportunity to ask questions and all were answered. The patient agreed with the plan and demonstrated an understanding of the instructions.   The patient was advised to call back or seek an in-person evaluation if the symptoms worsen or if the condition fails to improve as anticipated.  I provided 30 minutes of non-face-to-face time during this encounter.  The patient was located at home.  The provider was located at [place].   Abigail Gell, NP   Subjective:   Patient ID:  Abigail Wright is a 54 y.o. (DOB 09/30/1965) female.  Chief Complaint:  Chief Complaint  Patient presents with  . Depression  . Anxiety  . ADHD    HPI Abigail Wright presents for follow-up of anxiety, ADHD, and depression.  Describes mood today as "ok". Pleasant. Mood symptoms - denies depression, anxiety, and irritability. Stating "I'm doing real good". Moods "level" - "no highs or lows". Enjoyed spending holidays with family - stating "I cried when the left". Stable interest and motivation. Taking medications as prescribed.  Energy levels stable. Active, does not have a regular exercise routine. 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 Enjoys some usual interests and activities. Married. Lives with husband. 2 daughters live locally.    Appetite adequate. Weight stable. Sleeps well most nights. Averages 6 to 8 hours. Focus and concentration stable. Completing tasks. Managing aspects of household. Work going well. Denies SI or HI. Denies AH or VH.  Review of Systems:  Review of Systems  Musculoskeletal: Negative for gait problem.  Neurological: Negative for tremors.  Psychiatric/Behavioral:       Please refer to HPI    Medications: I have reviewed the patient's current medications.  Current Outpatient Medications  Medication Sig Dispense Refill  . amphetamine-dextroamphetamine (ADDERALL) 30 MG tablet Take 1 tablet by mouth 2 (two) times daily. 60 tablet 0  . [START ON 03/26/2019] amphetamine-dextroamphetamine (ADDERALL) 30 MG tablet Take 1 tablet by mouth 2 (two) times daily. 60 tablet 0  . [START ON 04/23/2019] amphetamine-dextroamphetamine (ADDERALL) 30 MG tablet Take 1 tablet by mouth 2 (two) times daily. 60 tablet 0  . buPROPion (WELLBUTRIN XL) 300 MG 24 hr tablet Take 1 tablet (300 mg total) by mouth daily. 90 tablet 1  . citalopram (CELEXA) 20 MG tablet Take 1 tablet (20 mg total) by mouth daily. 90 tablet 1  . clonazePAM (KLONOPIN) 0.5 MG tablet Take 1 tablet (0.5 mg total) by mouth at bedtime. 90 tablet 0  . fluticasone (FLONASE) 50 MCG/ACT nasal spray Place into the nose.    Marland Kitchen glucosamine-chondroitin 500-400 MG tablet Take by mouth.    . meloxicam (MOBIC) 15 MG tablet Take 1 tablet (15 mg total) by mouth daily. 30 tablet 0  . Multiple Vitamins-Minerals (MULTIVITAMIN ADULT EXTRA C PO) Take by mouth.    . predniSONE (STERAPRED UNI-PAK 21 TAB) 10 MG (  21) TBPK tablet Take as directed 21 tablet 0   No current facility-administered medications for this visit.    Medication Side Effects: None  Allergies: No Known Allergies  Past Medical History:  Diagnosis Date  . ADD (attention deficit disorder)   . Anxiety   . Blood transfusion without reported diagnosis    1993  . Depression   . GERD (gastroesophageal  reflux disease)    in past but recently controlled with diet and exercise.  Marland Kitchen Hyperlipidemia     Family History  Problem Relation Age of Onset  . Cancer Mother   . Diabetes Father   . Heart disease Paternal Uncle   . Hypertension Paternal Uncle   . Colon cancer Neg Hx   . Esophageal cancer Neg Hx   . Rectal cancer Neg Hx   . Stomach cancer Neg Hx     Social History   Socioeconomic History  . Marital status: Married    Spouse name: Not on file  . Number of children: Not on file  . Years of education: Not on file  . Highest education level: Not on file  Occupational History  . Not on file  Tobacco Use  . Smoking status: Former Games developer  . Smokeless tobacco: Never Used  Substance and Sexual Activity  . Alcohol use: Yes  . Drug use: No  . Sexual activity: Yes    Partners: Male    Birth control/protection: None, Post-menopausal  Other Topics Concern  . Not on file  Social History Narrative  . Not on file   Social Determinants of Health   Financial Resource Strain:   . Difficulty of Paying Living Expenses: Not on file  Food Insecurity:   . Worried About Programme researcher, broadcasting/film/video in the Last Year: Not on file  . Ran Out of Food in the Last Year: Not on file  Transportation Needs:   . Lack of Transportation (Medical): Not on file  . Lack of Transportation (Non-Medical): Not on file  Physical Activity:   . Days of Exercise per Week: Not on file  . Minutes of Exercise per Session: Not on file  Stress:   . Feeling of Stress : Not on file  Social Connections:   . Frequency of Communication with Friends and Family: Not on file  . Frequency of Social Gatherings with Friends and Family: Not on file  . Attends Religious Services: Not on file  . Active Member of Clubs or Organizations: Not on file  . Attends Banker Meetings: Not on file  . Marital Status: Not on file  Intimate Partner Violence:   . Fear of Current or Ex-Partner: Not on file  . Emotionally Abused:  Not on file  . Physically Abused: Not on file  . Sexually Abused: Not on file    Past Medical History, Surgical history, Social history, and Family history were reviewed and updated as appropriate.   Please see review of systems for further details on the patient's review from today.   Objective:   Physical Exam:  There were no vitals taken for this visit.  Physical Exam Constitutional:      General: She is not in acute distress.    Appearance: She is well-developed.  Musculoskeletal:        General: No deformity.  Neurological:     Mental Status: She is alert and oriented to person, place, and time.     Coordination: Coordination normal.  Psychiatric:  Attention and Perception: Attention and perception normal. She does not perceive auditory or visual hallucinations.        Mood and Affect: Mood normal. Mood is not anxious or depressed. Affect is not labile, blunt, angry or inappropriate.        Speech: Speech normal.        Behavior: Behavior normal.        Thought Content: Thought content normal. Thought content is not paranoid or delusional. Thought content does not include homicidal or suicidal ideation. Thought content does not include homicidal or suicidal plan.        Cognition and Memory: Cognition and memory normal.        Judgment: Judgment normal.     Comments: Insight intact     Lab Review:     Component Value Date/Time   NA 140 07/26/2017 0903   K 4.7 07/26/2017 0903   CL 102 07/26/2017 0903   CO2 31 07/26/2017 0903   GLUCOSE 95 07/26/2017 0903   BUN 13 07/26/2017 0903   CREATININE 0.74 07/26/2017 0903   CALCIUM 9.6 07/26/2017 0903   PROT 6.5 07/26/2017 0903   ALBUMIN 4.3 07/26/2017 0903   AST 18 07/26/2017 0903   ALT 19 07/26/2017 0903   ALKPHOS 52 07/26/2017 0903   BILITOT 0.6 07/26/2017 0903       Component Value Date/Time   WBC 5.5 07/26/2017 0903   RBC 4.92 07/26/2017 0903   HGB 14.5 07/26/2017 0903   HCT 43.4 07/26/2017 0903   PLT  292.0 07/26/2017 0903   MCV 88.3 07/26/2017 0903   MCHC 33.4 07/26/2017 0903   RDW 13.8 07/26/2017 0903    No results found for: POCLITH, LITHIUM   No results found for: PHENYTOIN, PHENOBARB, VALPROATE, CBMZ   .res Assessment: Plan:    Plan:  1. Adderall 30mg  BID 2. Celexa 20 mg daily 3. Wellbutrin XL 300mg  4. Clonazepam 0.5mg  daily   RTC 4 weeks  Patient advised to contact office with any questions, adverse effects, or acute worsening in signs and symptoms.  Discussed potential benefits, risk, and side effects of benzodiazepines to include potential risk of tolerance and dependence, as well as possible drowsiness.  Advised patient not to drive if experiencing drowsiness and to take lowest possible effective dose to minimize risk of dependence and tolerance.  Discussed potential benefits, risks, and side effects of stimulants with patient to include increased heart rate, palpitations, insomnia, increased anxiety, increased irritability, or decreased appetite.  Instructed patient to contact office if experiencing any significant tolerability issues.  Keyry was seen today for depression, anxiety and adhd.  Diagnoses and all orders for this visit:  Attention deficit hyperactivity disorder (ADHD), unspecified ADHD type  Generalized anxiety disorder -     buPROPion (WELLBUTRIN XL) 300 MG 24 hr tablet; Take 1 tablet (300 mg total) by mouth daily. -     citalopram (CELEXA) 20 MG tablet; Take 1 tablet (20 mg total) by mouth daily. -     clonazePAM (KLONOPIN) 0.5 MG tablet; Take 1 tablet (0.5 mg total) by mouth at bedtime.  Major depressive disorder, recurrent episode, moderate (HCC) -     buPROPion (WELLBUTRIN XL) 300 MG 24 hr tablet; Take 1 tablet (300 mg total) by mouth daily. -     amphetamine-dextroamphetamine (ADDERALL) 30 MG tablet; Take 1 tablet by mouth 2 (two) times daily. -     amphetamine-dextroamphetamine (ADDERALL) 30 MG tablet; Take 1 tablet by mouth 2 (two) times  daily. -  amphetamine-dextroamphetamine (ADDERALL) 30 MG tablet; Take 1 tablet by mouth 2 (two) times daily. -     citalopram (CELEXA) 20 MG tablet; Take 1 tablet (20 mg total) by mouth daily.    Please see After Visit Summary for patient specific instructions.  No future appointments.  No orders of the defined types were placed in this encounter.     -------------------------------

## 2019-05-28 ENCOUNTER — Ambulatory Visit (INDEPENDENT_AMBULATORY_CARE_PROVIDER_SITE_OTHER): Payer: PRIVATE HEALTH INSURANCE | Admitting: Adult Health

## 2019-05-28 ENCOUNTER — Encounter: Payer: Self-pay | Admitting: Adult Health

## 2019-05-28 DIAGNOSIS — F909 Attention-deficit hyperactivity disorder, unspecified type: Secondary | ICD-10-CM

## 2019-05-28 DIAGNOSIS — F331 Major depressive disorder, recurrent, moderate: Secondary | ICD-10-CM

## 2019-05-28 DIAGNOSIS — F411 Generalized anxiety disorder: Secondary | ICD-10-CM | POA: Diagnosis not present

## 2019-05-28 MED ORDER — BUPROPION HCL ER (XL) 300 MG PO TB24: 300.0000 mg | ORAL_TABLET | Freq: Every day | ORAL | 3 refills | Status: DC

## 2019-05-28 MED ORDER — CITALOPRAM HYDROBROMIDE 20 MG PO TABS: 20.0000 mg | ORAL_TABLET | Freq: Every day | ORAL | 3 refills | Status: DC

## 2019-05-28 MED ORDER — AMPHETAMINE-DEXTROAMPHETAMINE 30 MG PO TABS: 30.0000 mg | ORAL_TABLET | Freq: Two times a day (BID) | ORAL | 0 refills | Status: DC

## 2019-05-28 MED ORDER — CLONAZEPAM 0.5 MG PO TABS: 0.5000 mg | ORAL_TABLET | Freq: Every day | ORAL | 2 refills | Status: DC

## 2019-05-28 NOTE — Progress Notes (Signed)
Abigail Wright [id number] [birth year] 54 y.o.  Virtual Visit via Telephone Note  I connected with pt on 05/28/19 at  1:00 PM EDT by telephone and verified that I am speaking with the correct person using two identifiers.   I discussed the limitations, risks, security and privacy concerns of performing an evaluation and management service by telephone and the availability of in person appointments. I also discussed with the patient that there may be a patient responsible charge related to this service. The patient expressed understanding and agreed to proceed.   I discussed the assessment and treatment plan with the patient. The patient was provided an opportunity to ask questions and all were answered. The patient agreed with the plan and demonstrated an understanding of the instructions.   The patient was advised to call back or seek an in-person evaluation if the symptoms worsen or if the condition fails to improve as anticipated.  I provided 30 minutes of non-face-to-face time during this encounter.  The patient was located at home.  The provider was located at [facility].   Dorothyann Gibbs, NP   Subjective:   Patient ID:  Abigail Wright is a 54 y.o. (DOB December 18, 1965) female.  Chief Complaint: No chief complaint on file.   HPI   Abigail Wright presents for follow-up of anxiety, ADHD, and depression.  Describes mood today as "ok". Pleasant. Mood symptoms - denies depression, anxiety, and irritability. Stating "I'm hanging in there". Mother recently diagnosed with ALS. She and brother are sharing care of her until they can find some help. Driving 40 minutes to her and staying 12 hours a day. Hoping to find someone to help assist with care. Feels "worn out". Stating "I'm doing what I have to do". Daughter expecting at end of April - "looking forward to that". Stable interest and motivation. Taking medications as prescribed.  Energy levels stable. Active, does not have a regular  exercise routine.  Enjoys some usual interests and activities. Married. Lives with husband. 2 daughters local. Appetite adequate. Weight gain 6 pounds - "not eating healthy as I should".  Sleeps well most nights. Averages 5 to 7 hours. Focus and concentration stable. Completing tasks. Managing aspects of household. 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  Denies SI or HI. Denies AH or VH   Review of Systems:  Review of Systems  Musculoskeletal: Negative for gait problem.  Neurological: Negative for tremors.  Psychiatric/Behavioral:       Please refer to HPI    Medications: I have reviewed the patient's current medications.  Current Outpatient Medications  Medication Sig Dispense Refill  . amphetamine-dextroamphetamine (ADDERALL) 30 MG tablet Take 1 tablet by mouth 2 (two) times daily. 60 tablet 0  . [START ON 06/25/2019] amphetamine-dextroamphetamine (ADDERALL) 30 MG tablet Take 1 tablet by mouth 2 (two) times daily. 60 tablet 0  . [START ON 07/23/2019] amphetamine-dextroamphetamine (ADDERALL) 30 MG tablet Take 1 tablet by mouth 2 (two) times daily. 60 tablet 0  . buPROPion (WELLBUTRIN XL) 300 MG 24 hr tablet Take 1 tablet (300 mg total) by mouth daily. 90 tablet 3  . citalopram (CELEXA) 20 MG tablet Take 1 tablet (20 mg total) by mouth daily. 90 tablet 3  . clonazePAM (KLONOPIN) 0.5 MG tablet Take 1 tablet (0.5 mg total) by mouth at bedtime. 90 tablet 2  . fluticasone (FLONASE) 50 MCG/ACT nasal spray Place into the nose.    Marland Kitchen glucosamine-chondroitin 500-400 MG tablet Take by mouth.    . meloxicam (MOBIC) 15 MG tablet Take 1  tablet (15 mg total) by mouth daily. 30 tablet 0  . Multiple Vitamins-Minerals (MULTIVITAMIN ADULT EXTRA C PO) Take by mouth.    . predniSONE (STERAPRED UNI-PAK 21 TAB) 10 MG (21) TBPK tablet Take as directed 21 tablet 0   No current facility-administered medications for this visit.    Medication Side Effects: None  Allergies: No Known Allergies  Past Medical History:   Diagnosis Date  . ADD (attention deficit disorder)   . Anxiety   . Blood transfusion without reported diagnosis    1993  . Depression   . GERD (gastroesophageal reflux disease)    in past but recently controlled with diet and exercise.  Marland Kitchen Hyperlipidemia     Family History  Problem Relation Age of Onset  . Cancer Mother   . Diabetes Father   . Heart disease Paternal Uncle   . Hypertension Paternal Uncle   . Colon cancer Neg Hx   . Esophageal cancer Neg Hx   . Rectal cancer Neg Hx   . Stomach cancer Neg Hx     Social History   Socioeconomic History  . Marital status: Married    Spouse name: Not on file  . Number of children: Not on file  . Years of education: Not on file  . Highest education level: Not on file  Occupational History  . Not on file  Tobacco Use  . Smoking status: Former Research scientist (life sciences)  . Smokeless tobacco: Never Used  Substance and Sexual Activity  . Alcohol use: Yes  . Drug use: No  . Sexual activity: Yes    Partners: Male    Birth control/protection: None, Post-menopausal  Other Topics Concern  . Not on file  Social History Narrative  . Not on file   Social Determinants of Health   Financial Resource Strain:   . Difficulty of Paying Living Expenses:   Food Insecurity:   . Worried About Charity fundraiser in the Last Year:   . Arboriculturist in the Last Year:   Transportation Needs:   . Film/video editor (Medical):   Marland Kitchen Lack of Transportation (Non-Medical):   Physical Activity:   . Days of Exercise per Week:   . Minutes of Exercise per Session:   Stress:   . Feeling of Stress :   Social Connections:   . Frequency of Communication with Friends and Family:   . Frequency of Social Gatherings with Friends and Family:   . Attends Religious Services:   . Active Member of Clubs or Organizations:   . Attends Archivist Meetings:   Marland Kitchen Marital Status:   Intimate Partner Violence:   . Fear of Current or Ex-Partner:   . Emotionally  Abused:   Marland Kitchen Physically Abused:   . Sexually Abused:     Past Medical History, Surgical history, Social history, and Family history were reviewed and updated as appropriate.   Please see review of systems for further details on the patient's review from today.   Objective:   Physical Exam:  There were no vitals taken for this visit.  Physical Exam  Lab Review:     Component Value Date/Time   NA 140 07/26/2017 0903   K 4.7 07/26/2017 0903   CL 102 07/26/2017 0903   CO2 31 07/26/2017 0903   GLUCOSE 95 07/26/2017 0903   BUN 13 07/26/2017 0903   CREATININE 0.74 07/26/2017 0903   CALCIUM 9.6 07/26/2017 0903   PROT 6.5 07/26/2017 0903   ALBUMIN 4.3  07/26/2017 0903   AST 18 07/26/2017 0903   ALT 19 07/26/2017 0903   ALKPHOS 52 07/26/2017 0903   BILITOT 0.6 07/26/2017 0903       Component Value Date/Time   WBC 5.5 07/26/2017 0903   RBC 4.92 07/26/2017 0903   HGB 14.5 07/26/2017 0903   HCT 43.4 07/26/2017 0903   PLT 292.0 07/26/2017 0903   MCV 88.3 07/26/2017 0903   MCHC 33.4 07/26/2017 0903   RDW 13.8 07/26/2017 0903    No results found for: POCLITH, LITHIUM   No results found for: PHENYTOIN, PHENOBARB, VALPROATE, CBMZ   .res Assessment: Plan:    Plan:  1. Adderall 30mg  BID 2. Celexa 20 mg daily 3. Wellbutrin XL 300mg  4. Clonazepam 0.5mg  daily   RTC 3 months  Patient advised to contact office with any questions, adverse effects, or acute worsening in signs and symptoms.  Discussed potential benefits, risk, and side effects of benzodiazepines to include potential risk of tolerance and dependence, as well as possible drowsiness.  Advised patient not to drive if experiencing drowsiness and to take lowest possible effective dose to minimize risk of dependence and tolerance.  Discussed potential benefits, risks, and side effects of stimulants with patient to include increased heart rate, palpitations, insomnia, increased anxiety, increased irritability, or  decreased appetite.  Instructed patient to contact office if experiencing any significant tolerability issues.    Diagnoses and all orders for this visit:  Attention deficit hyperactivity disorder (ADHD), unspecified ADHD type  Generalized anxiety disorder -     buPROPion (WELLBUTRIN XL) 300 MG 24 hr tablet; Take 1 tablet (300 mg total) by mouth daily. -     citalopram (CELEXA) 20 MG tablet; Take 1 tablet (20 mg total) by mouth daily. -     clonazePAM (KLONOPIN) 0.5 MG tablet; Take 1 tablet (0.5 mg total) by mouth at bedtime.  Major depressive disorder, recurrent episode, moderate (HCC) -     buPROPion (WELLBUTRIN XL) 300 MG 24 hr tablet; Take 1 tablet (300 mg total) by mouth daily. -     amphetamine-dextroamphetamine (ADDERALL) 30 MG tablet; Take 1 tablet by mouth 2 (two) times daily. -     amphetamine-dextroamphetamine (ADDERALL) 30 MG tablet; Take 1 tablet by mouth 2 (two) times daily. -     amphetamine-dextroamphetamine (ADDERALL) 30 MG tablet; Take 1 tablet by mouth 2 (two) times daily. -     citalopram (CELEXA) 20 MG tablet; Take 1 tablet (20 mg total) by mouth daily.    Please see After Visit Summary for patient specific instructions.  Future Appointments  Date Time Provider Department Center  08/28/2019  1:00 PM Zayvon Alicea, , NP [facility] None    No orders of the defined types were placed in this encounter.     -------------------------------

## 2019-07-31 ENCOUNTER — Other Ambulatory Visit: Payer: Self-pay | Admitting: Adult Health

## 2019-07-31 DIAGNOSIS — F411 Generalized anxiety disorder: Secondary | ICD-10-CM

## 2019-07-31 DIAGNOSIS — F331 Major depressive disorder, recurrent, moderate: Secondary | ICD-10-CM

## 2019-08-28 ENCOUNTER — Telehealth (INDEPENDENT_AMBULATORY_CARE_PROVIDER_SITE_OTHER): Payer: PRIVATE HEALTH INSURANCE | Admitting: Adult Health

## 2019-08-28 ENCOUNTER — Encounter: Payer: Self-pay | Admitting: Adult Health

## 2019-08-28 DIAGNOSIS — F331 Major depressive disorder, recurrent, moderate: Secondary | ICD-10-CM | POA: Diagnosis not present

## 2019-08-28 DIAGNOSIS — F411 Generalized anxiety disorder: Secondary | ICD-10-CM | POA: Diagnosis not present

## 2019-08-28 DIAGNOSIS — F909 Attention-deficit hyperactivity disorder, unspecified type: Secondary | ICD-10-CM | POA: Diagnosis not present

## 2019-08-28 MED ORDER — AMPHETAMINE-DEXTROAMPHETAMINE 30 MG PO TABS: 30.0000 mg | ORAL_TABLET | Freq: Two times a day (BID) | ORAL | 0 refills | Status: DC

## 2019-08-28 MED ORDER — CLONAZEPAM 0.5 MG PO TABS: 0.5000 mg | ORAL_TABLET | Freq: Every day | ORAL | 2 refills | Status: DC

## 2019-08-28 NOTE — Progress Notes (Signed)
Ciaira Ainley [id number] 1965/04/04 54 y.o.  Virtual Visit via Video Note  I connected with pt @ on 08/28/19 at  1:00 PM EDT by a video enabled telemedicine application and verified that I am speaking with the correct person using two identifiers.   I discussed the limitations of evaluation and management by telemedicine and the availability of in person appointments. The patient expressed understanding and agreed to proceed.  I discussed the assessment and treatment plan with the patient. The patient was provided an opportunity to ask questions and all were answered. The patient agreed with the plan and demonstrated an understanding of the instructions.   The patient was advised to call back or seek an in-person evaluation if the symptoms worsen or if the condition fails to improve as anticipated.  I provided 30 minutes of non-face-to-face time during this encounter.  The patient was located at home.  The provider was located at [facility].   Dorothyann Gibbs, NP   Subjective:   Patient ID:  Abigail Wright is a 54 y.o. (DOB 1965/03/10) female.  Chief Complaint: No chief complaint on file.   HPI Abigail Wright presents for follow-up of Anxiety, ADHD, and Depression.  Describes mood today as "ok". Pleasant. Mood symptoms - denies depression, anxiety, and irritability. Stating "I'm doing alright". Mother passed away in 2022-06-14 from ALS - "having moments". Has a new granddaughter - keeping her during the day. Stable interest and motivation. Taking medications as prescribed.  Energy levels stable. Active, does not have a regular exercise routine.  Enjoys some usual interests and activities. Married. Lives with husband of 33 years. 2 daughters local. Appetite adequate. Weight gain - a total of 10 pounds.  Sleeps well most nights. Averages 5 to 6 hours. Has one night a week where she doesn't sleep.  Focus and concentration stable. Completing tasks. Managing aspects of household.  Works from home.   Denies SI or HI. Denies AH or VH  Review of Systems:  Review of Systems  Musculoskeletal: Negative for gait problem.  Neurological: Negative for tremors.  Psychiatric/Behavioral:       Please refer to HPI    Medications: I have reviewed the patient's current medications.  Current Outpatient Medications  Medication Sig Dispense Refill  . amphetamine-dextroamphetamine (ADDERALL) 30 MG tablet Take 1 tablet by mouth 2 (two) times daily. 60 tablet 0  . [START ON 09/25/2019] amphetamine-dextroamphetamine (ADDERALL) 30 MG tablet Take 1 tablet by mouth 2 (two) times daily. 60 tablet 0  . [START ON 10/23/2019] amphetamine-dextroamphetamine (ADDERALL) 30 MG tablet Take 1 tablet by mouth 2 (two) times daily. 60 tablet 0  . buPROPion (WELLBUTRIN XL) 300 MG 24 hr tablet Take 1 tablet (300 mg total) by mouth daily. 90 tablet 3  . citalopram (CELEXA) 20 MG tablet TAKE 1 TABLET BY MOUTH EACH DAY 90 tablet 3  . clonazePAM (KLONOPIN) 0.5 MG tablet Take 1 tablet (0.5 mg total) by mouth at bedtime. 90 tablet 2  . fluticasone (FLONASE) 50 MCG/ACT nasal spray Place into the nose.    Marland Kitchen glucosamine-chondroitin 500-400 MG tablet Take by mouth.    . meloxicam (MOBIC) 15 MG tablet Take 1 tablet (15 mg total) by mouth daily. 30 tablet 0  . Multiple Vitamins-Minerals (MULTIVITAMIN ADULT EXTRA C PO) Take by mouth.    . predniSONE (STERAPRED UNI-PAK 21 TAB) 10 MG (21) TBPK tablet Take as directed 21 tablet 0   No current facility-administered medications for this visit.    Medication Side Effects: None  Allergies: No Known Allergies  Past Medical History:  Diagnosis Date  . ADD (attention deficit disorder)   . Anxiety   . Blood transfusion without reported diagnosis    1993  . Depression   . GERD (gastroesophageal reflux disease)    in past but recently controlled with diet and exercise.  Marland Kitchen Hyperlipidemia     Family History  Problem Relation Age of Onset  . Cancer Mother   .  Diabetes Father   . Heart disease Paternal Uncle   . Hypertension Paternal Uncle   . Colon cancer Neg Hx   . Esophageal cancer Neg Hx   . Rectal cancer Neg Hx   . Stomach cancer Neg Hx     Social History   Socioeconomic History  . Marital status: Married    Spouse name: Not on file  . Number of children: Not on file  . Years of education: Not on file  . Highest education level: Not on file  Occupational History  . Not on file  Tobacco Use  . Smoking status: Former Games developer  . Smokeless tobacco: Never Used  Vaping Use  . Vaping Use: Never used  Substance and Sexual Activity  . Alcohol use: Yes  . Drug use: No  . Sexual activity: Yes    Partners: Male    Birth control/protection: None, Post-menopausal  Other Topics Concern  . Not on file  Social History Narrative  . Not on file   Social Determinants of Health   Financial Resource Strain:   . Difficulty of Paying Living Expenses:   Food Insecurity:   . Worried About Programme researcher, broadcasting/film/video in the Last Year:   . Barista in the Last Year:   Transportation Needs:   . Freight forwarder (Medical):   Marland Kitchen Lack of Transportation (Non-Medical):   Physical Activity:   . Days of Exercise per Week:   . Minutes of Exercise per Session:   Stress:   . Feeling of Stress :   Social Connections:   . Frequency of Communication with Friends and Family:   . Frequency of Social Gatherings with Friends and Family:   . Attends Religious Services:   . Active Member of Clubs or Organizations:   . Attends Banker Meetings:   Marland Kitchen Marital Status:   Intimate Partner Violence:   . Fear of Current or Ex-Partner:   . Emotionally Abused:   Marland Kitchen Physically Abused:   . Sexually Abused:     Past Medical History, Surgical history, Social history, and Family history were reviewed and updated as appropriate.   Please see review of systems for further details on the patient's review from today.   Objective:   Physical Exam:   There were no vitals taken for this visit.  Physical Exam Constitutional:      General: She is not in acute distress. Musculoskeletal:        General: No deformity.  Neurological:     Mental Status: She is alert and oriented to person, place, and time.     Coordination: Coordination normal.  Psychiatric:        Attention and Perception: Attention and perception normal. She does not perceive auditory or visual hallucinations.        Mood and Affect: Mood normal. Mood is not anxious or depressed. Affect is not labile, blunt, angry or inappropriate.        Speech: Speech normal.        Behavior: Behavior  normal.        Thought Content: Thought content normal. Thought content is not paranoid or delusional. Thought content does not include homicidal or suicidal ideation. Thought content does not include homicidal or suicidal plan.        Cognition and Memory: Cognition and memory normal.        Judgment: Judgment normal.     Comments: Insight intact     Lab Review:     Component Value Date/Time   NA 140 07/26/2017 0903   K 4.7 07/26/2017 0903   CL 102 07/26/2017 0903   CO2 31 07/26/2017 0903   GLUCOSE 95 07/26/2017 0903   BUN 13 07/26/2017 0903   CREATININE 0.74 07/26/2017 0903   CALCIUM 9.6 07/26/2017 0903   PROT 6.5 07/26/2017 0903   ALBUMIN 4.3 07/26/2017 0903   AST 18 07/26/2017 0903   ALT 19 07/26/2017 0903   ALKPHOS 52 07/26/2017 0903   BILITOT 0.6 07/26/2017 0903       Component Value Date/Time   WBC 5.5 07/26/2017 0903   RBC 4.92 07/26/2017 0903   HGB 14.5 07/26/2017 0903   HCT 43.4 07/26/2017 0903   PLT 292.0 07/26/2017 0903   MCV 88.3 07/26/2017 0903   MCHC 33.4 07/26/2017 0903   RDW 13.8 07/26/2017 0903    No results found for: POCLITH, LITHIUM   No results found for: PHENYTOIN, PHENOBARB, VALPROATE, CBMZ   .res Assessment: Plan:    Plan:  1. Adderall 30mg  BID 2. Celexa 20 mg daily 3. Wellbutrin XL 300mg  4. Clonazepam 0.5mg  daily   130/79  87  RTC 3 months  Patient advised to contact office with any questions, adverse effects, or acute worsening in signs and symptoms.  Discussed potential benefits, risk, and side effects of benzodiazepines to include potential risk of tolerance and dependence, as well as possible drowsiness.  Advised patient not to drive if experiencing drowsiness and to take lowest possible effective dose to minimize risk of dependence and tolerance.  Discussed potential benefits, risks, and side effects of stimulants with patient to include increased heart rate, palpitations, insomnia, increased anxiety, increased irritability, or decreased appetite.  Instructed patient to contact office if experiencing any significant tolerability issues.   Diagnoses and all orders for this visit:  Attention deficit hyperactivity disorder (ADHD), unspecified ADHD type  Generalized anxiety disorder -     clonazePAM (KLONOPIN) 0.5 MG tablet; Take 1 tablet (0.5 mg total) by mouth at bedtime.  Major depressive disorder, recurrent episode, moderate (HCC) -     amphetamine-dextroamphetamine (ADDERALL) 30 MG tablet; Take 1 tablet by mouth 2 (two) times daily. -     amphetamine-dextroamphetamine (ADDERALL) 30 MG tablet; Take 1 tablet by mouth 2 (two) times daily. -     amphetamine-dextroamphetamine (ADDERALL) 30 MG tablet; Take 1 tablet by mouth 2 (two) times daily.     Please see After Visit Summary for patient specific instructions.  No future appointments.  No orders of the defined types were placed in this encounter.     -------------------------------

## 2019-11-27 ENCOUNTER — Encounter: Payer: Self-pay | Admitting: Adult Health

## 2019-11-27 ENCOUNTER — Telehealth (INDEPENDENT_AMBULATORY_CARE_PROVIDER_SITE_OTHER): Payer: PRIVATE HEALTH INSURANCE | Admitting: Adult Health

## 2019-11-27 DIAGNOSIS — F411 Generalized anxiety disorder: Secondary | ICD-10-CM

## 2019-11-27 DIAGNOSIS — F909 Attention-deficit hyperactivity disorder, unspecified type: Secondary | ICD-10-CM | POA: Diagnosis not present

## 2019-11-27 DIAGNOSIS — F331 Major depressive disorder, recurrent, moderate: Secondary | ICD-10-CM

## 2019-11-27 MED ORDER — AMPHETAMINE-DEXTROAMPHETAMINE 30 MG PO TABS: 30.0000 mg | ORAL_TABLET | Freq: Two times a day (BID) | ORAL | 0 refills | Status: DC

## 2019-11-27 MED ORDER — CLONAZEPAM 0.5 MG PO TABS: 0.5000 mg | ORAL_TABLET | Freq: Every day | ORAL | 2 refills | Status: DC

## 2019-11-27 NOTE — Progress Notes (Signed)
Abigail Wright [id number] 1965/03/06 54 y.o.  Virtual Visit via Video Note  I connected with pt @ on 11/27/19 at  1:00 PM EDT by a video enabled telemedicine application and verified that I am speaking with the correct person using two identifiers.   I discussed the limitations of evaluation and management by telemedicine and the availability of in person appointments. The patient expressed understanding and agreed to proceed.  I discussed the assessment and treatment plan with the patient. The patient was provided an opportunity to ask questions and all were answered. The patient agreed with the plan and demonstrated an understanding of the instructions.   The patient was advised to call back or seek an in-person evaluation if the symptoms worsen or if the condition fails to improve as anticipated.  I provided 20 minutes of non-face-to-face time during this encounter.  The patient was located at home.  The provider was located at [facility].   Dorothyann Gibbs, NP   Subjective:   Patient ID:  Abigail Wright is a 54 y.o. (DOB [birth year]) female.  Chief Complaint: No chief complaint on file.   HPI Abigail Wright presents for follow-up of Anxiety, ADHD, and Depression.  Describes mood today as "ok". Pleasant. Mood symptoms - denies depression, anxiety, and irritability. Stating "I'm doing good". Grieving loss of mother. Granddaughter now 35 months old. Keeping her during the day. Stable interest and motivation. Taking medications as prescribed.  Energy levels stable. Active, does not have a regular exercise routine. Has started walking on cooler days.  Enjoys some usual interests and activities. Married. Lives with husband of 33 years. 2 daughters local. Appetite adequate. Weight stable.  Sleeps well most nights. Averages 5 to 6 hours.  Focus and concentration stable. Completing tasks. Managing aspects of household. Works from home.   Denies SI or HI. Denies AH or VH  Review  of Systems:  Review of Systems  Musculoskeletal: Negative for gait problem.  Neurological: Negative for tremors.  Psychiatric/Behavioral:       Please refer to HPI    Medications: I have reviewed the patient's current medications.  Current Outpatient Medications  Medication Sig Dispense Refill  . amphetamine-dextroamphetamine (ADDERALL) 30 MG tablet Take 1 tablet by mouth 2 (two) times daily. 60 tablet 0  . amphetamine-dextroamphetamine (ADDERALL) 30 MG tablet Take 1 tablet by mouth 2 (two) times daily. 60 tablet 0  . amphetamine-dextroamphetamine (ADDERALL) 30 MG tablet Take 1 tablet by mouth 2 (two) times daily. 60 tablet 0  . buPROPion (WELLBUTRIN XL) 300 MG 24 hr tablet Take 1 tablet (300 mg total) by mouth daily. 90 tablet 3  . citalopram (CELEXA) 20 MG tablet TAKE 1 TABLET BY MOUTH EACH DAY 90 tablet 3  . clonazePAM (KLONOPIN) 0.5 MG tablet Take 1 tablet (0.5 mg total) by mouth at bedtime. 90 tablet 2  . fluticasone (FLONASE) 50 MCG/ACT nasal spray Place into the nose.    Marland Kitchen glucosamine-chondroitin 500-400 MG tablet Take by mouth.    . meloxicam (MOBIC) 15 MG tablet Take 1 tablet (15 mg total) by mouth daily. 30 tablet 0  . Multiple Vitamins-Minerals (MULTIVITAMIN ADULT EXTRA C PO) Take by mouth.    . predniSONE (STERAPRED UNI-PAK 21 TAB) 10 MG (21) TBPK tablet Take as directed 21 tablet 0   No current facility-administered medications for this visit.    Medication Side Effects: None  Allergies: No Known Allergies  Past Medical History:  Diagnosis Date  . ADD (attention deficit disorder)   .  Anxiety   . Blood transfusion without reported diagnosis    1993  . Depression   . GERD (gastroesophageal reflux disease)    in past but recently controlled with diet and exercise.  Marland Kitchen Hyperlipidemia     Family History  Problem Relation Age of Onset  . Cancer Mother   . Diabetes Father   . Heart disease Paternal Uncle   . Hypertension Paternal Uncle   . Colon cancer Neg Hx    . Esophageal cancer Neg Hx   . Rectal cancer Neg Hx   . Stomach cancer Neg Hx     Social History   Socioeconomic History  . Marital status: Married    Spouse name: Not on file  . Number of children: Not on file  . Years of education: Not on file  . Highest education level: Not on file  Occupational History  . Not on file  Tobacco Use  . Smoking status: Former Games developer  . Smokeless tobacco: Never Used  Vaping Use  . Vaping Use: Never used  Substance and Sexual Activity  . Alcohol use: Yes  . Drug use: No  . Sexual activity: Yes    Partners: Male    Birth control/protection: None, Post-menopausal  Other Topics Concern  . Not on file  Social History Narrative  . Not on file   Social Determinants of Health   Financial Resource Strain:   . Difficulty of Paying Living Expenses: Not on file  Food Insecurity:   . Worried About Programme researcher, broadcasting/film/video in the Last Year: Not on file  . Ran Out of Food in the Last Year: Not on file  Transportation Needs:   . Lack of Transportation (Medical): Not on file  . Lack of Transportation (Non-Medical): Not on file  Physical Activity:   . Days of Exercise per Week: Not on file  . Minutes of Exercise per Session: Not on file  Stress:   . Feeling of Stress : Not on file  Social Connections:   . Frequency of Communication with Friends and Family: Not on file  . Frequency of Social Gatherings with Friends and Family: Not on file  . Attends Religious Services: Not on file  . Active Member of Clubs or Organizations: Not on file  . Attends Banker Meetings: Not on file  . Marital Status: Not on file  Intimate Partner Violence:   . Fear of Current or Ex-Partner: Not on file  . Emotionally Abused: Not on file  . Physically Abused: Not on file  . Sexually Abused: Not on file    Past Medical History, Surgical history, Social history, and Family history were reviewed and updated as appropriate.   Please see review of systems for  further details on the patient's review from today.   Objective:   Physical Exam:  There were no vitals taken for this visit.  Physical Exam Constitutional:      General: She is not in acute distress. Musculoskeletal:        General: No deformity.  Neurological:     Mental Status: She is alert and oriented to person, place, and time.     Coordination: Coordination normal.  Psychiatric:        Attention and Perception: Attention and perception normal. She does not perceive auditory or visual hallucinations.        Mood and Affect: Mood normal. Mood is not anxious or depressed. Affect is not labile, blunt, angry or inappropriate.  Speech: Speech normal.        Behavior: Behavior normal.        Thought Content: Thought content normal. Thought content is not paranoid or delusional. Thought content does not include homicidal or suicidal ideation. Thought content does not include homicidal or suicidal plan.        Cognition and Memory: Cognition and memory normal.        Judgment: Judgment normal.     Comments: Insight intact     Lab Review:     Component Value Date/Time   NA 140 07/26/2017 0903   K 4.7 07/26/2017 0903   CL 102 07/26/2017 0903   CO2 31 07/26/2017 0903   GLUCOSE 95 07/26/2017 0903   BUN 13 07/26/2017 0903   CREATININE 0.74 07/26/2017 0903   CALCIUM 9.6 07/26/2017 0903   PROT 6.5 07/26/2017 0903   ALBUMIN 4.3 07/26/2017 0903   AST 18 07/26/2017 0903   ALT 19 07/26/2017 0903   ALKPHOS 52 07/26/2017 0903   BILITOT 0.6 07/26/2017 0903       Component Value Date/Time   WBC 5.5 07/26/2017 0903   RBC 4.92 07/26/2017 0903   HGB 14.5 07/26/2017 0903   HCT 43.4 07/26/2017 0903   PLT 292.0 07/26/2017 0903   MCV 88.3 07/26/2017 0903   MCHC 33.4 07/26/2017 0903   RDW 13.8 07/26/2017 0903    No results found for: POCLITH, LITHIUM   No results found for: PHENYTOIN, PHENOBARB, VALPROATE, CBMZ   .res Assessment: Plan:    Plan:  1. Adderall 30mg   BID 2. Celexa 20 mg daily 3. Wellbutrin XL 300mg  4. Clonazepam 0.5mg  daily   Continue to monitor BP - upcoming physical  RTC 3 months  Patient advised to contact office with any questions, adverse effects, or acute worsening in signs and symptoms.  Discussed potential benefits, risk, and side effects of benzodiazepines to include potential risk of tolerance and dependence, as well as possible drowsiness.  Advised patient not to drive if experiencing drowsiness and to take lowest possible effective dose to minimize risk of dependence and tolerance.  Discussed potential benefits, risks, and side effects of stimulants with patient to include increased heart rate, palpitations, insomnia, increased anxiety, increased irritability, or decreased appetite.  Instructed patient to contact office if experiencing any significant tolerability issues.   Diagnoses and all orders for this visit:  Attention deficit hyperactivity disorder (ADHD), unspecified ADHD type  Major depressive disorder, recurrent episode, moderate (HCC)  Generalized anxiety disorder     Please see After Visit Summary for patient specific instructions.  No future appointments.  No orders of the defined types were placed in this encounter.     -------------------------------

## 2020-01-25 ENCOUNTER — Telehealth: Payer: PRIVATE HEALTH INSURANCE | Admitting: Family

## 2020-01-28 ENCOUNTER — Telehealth (HOSPITAL_COMMUNITY): Payer: Self-pay | Admitting: *Deleted

## 2020-01-28 NOTE — Telephone Encounter (Signed)
Called to Discuss with patient about Covid symptoms and the use of the monoclonal antibody infusion for those with mild to moderate Covid symptoms and at a high risk of hospitalization.     Pt appears to qualify for this infusion due to co-morbid conditions and/or a member of an at-risk group in accordance with the FDA Emergency Use Authorization.    Pt started having symptoms on 01/25/20 and had a positive home test on 01/27/20. Her symptoms include fever, cough, chills, nausea, and body aches.   Pt is a former smoker and she currently vapes. She states she is 59ft 9in and weighs 240lbs, which puts her BMI at 35.4.   Pt informed of the estimated costs of the treatment.   Pt informed an APP will be in touch with her.

## 2020-01-29 ENCOUNTER — Telehealth: Payer: Self-pay | Admitting: Physician Assistant

## 2020-01-29 NOTE — Telephone Encounter (Signed)
Called to Discuss with patient about Covid symptoms and the use of the monoclonal antibody infusion for those with mild to moderate Covid symptoms and at a high risk of hospitalization.     Pt appears to qualify for this infusion due to co-morbid conditions and/or a member of an at-risk group in accordance with the FDA Emergency Use Authorization.    Unable to reach pt. Left voice mail to call back.   

## 2020-01-31 ENCOUNTER — Other Ambulatory Visit: Payer: Self-pay | Admitting: Physician Assistant

## 2020-01-31 DIAGNOSIS — Z6836 Body mass index (BMI) 36.0-36.9, adult: Secondary | ICD-10-CM

## 2020-01-31 DIAGNOSIS — Z87891 Personal history of nicotine dependence: Secondary | ICD-10-CM

## 2020-01-31 DIAGNOSIS — U071 COVID-19: Secondary | ICD-10-CM

## 2020-01-31 NOTE — Progress Notes (Signed)
I connected by phone with Abigail Wright on 01/31/2020 at 10:47 AM to discuss the potential use of a new treatment for mild to moderate COVID-19 viral infection in non-hospitalized patients.  This patient is a 55 y.o. female that meets the FDA criteria for Emergency Use Authorization of COVID monoclonal antibody casirivimab/imdevimab, bamlanivimab/eteseviamb, or sotrovimab.  Has a (+) direct SARS-CoV-2 viral test result  Has mild or moderate COVID-19   Is NOT hospitalized due to COVID-19  Is within 10 days of symptom onset  Has at least one of the high risk factor(s) for progression to severe COVID-19 and/or hospitalization as defined in EUA.  Specific high risk criteria : BMI > 25 and Other high risk medical condition per CDC:  former smoker, current vaping   I have spoken and communicated the following to the patient or parent/caregiver regarding COVID monoclonal antibody treatment:  1. FDA has authorized the emergency use for the treatment of mild to moderate COVID-19 in adults and pediatric patients with positive results of direct SARS-CoV-2 viral testing who are 3 years of age and older weighing at least 40 kg, and who are at high risk for progressing to severe COVID-19 and/or hospitalization.  2. The significant known and potential risks and benefits of COVID monoclonal antibody, and the extent to which such potential risks and benefits are unknown.  3. Information on available alternative treatments and the risks and benefits of those alternatives, including clinical trials.  4. Patients treated with COVID monoclonal antibody should continue to self-isolate and use infection control measures (e.g., wear mask, isolate, social distance, avoid sharing personal items, clean and disinfect "high touch" surfaces, and frequent handwashing) according to CDC guidelines.   5. The patient or parent/caregiver has the option to accept or refuse COVID monoclonal antibody treatment.  After  reviewing this information with the patient, the patient has agreed to receive one of the available covid 19 monoclonal antibodies and will be provided an appropriate fact sheet prior to infusion. Roe Rutherford Tylyn Stankovich, [place] 01/31/2020 10:47 AM

## 2020-02-01 ENCOUNTER — Ambulatory Visit (HOSPITAL_COMMUNITY)
Admission: RE | Admit: 2020-02-01 | Discharge: 2020-02-01 | Disposition: A | Discharge status: Home / Self Care | Payer: No Typology Code available for payment source | Source: Ambulatory Visit | Attending: Pulmonary Disease | Admitting: Pulmonary Disease

## 2020-02-01 DIAGNOSIS — Z87891 Personal history of nicotine dependence: Secondary | ICD-10-CM | POA: Insufficient documentation

## 2020-02-01 DIAGNOSIS — U071 COVID-19: Secondary | ICD-10-CM | POA: Diagnosis present

## 2020-02-01 DIAGNOSIS — Z6836 Body mass index (BMI) 36.0-36.9, adult: Secondary | ICD-10-CM | POA: Insufficient documentation

## 2020-02-01 MED ORDER — SODIUM CHLORIDE 0.9 % IV SOLN
1200.0000 mg | Freq: Once | INTRAVENOUS | Status: AC
  Administered 2020-02-01: 1200 mg via INTRAVENOUS

## 2020-02-01 MED ORDER — EPINEPHRINE 0.3 MG/0.3ML IJ SOAJ: 0.3000 mg | Freq: Once | INTRAMUSCULAR | Status: DC | PRN

## 2020-02-01 MED ORDER — FAMOTIDINE IN NACL 20-0.9 MG/50ML-% IV SOLN: 20.0000 mg | Freq: Once | INTRAVENOUS | Status: DC | PRN

## 2020-02-01 MED ORDER — METHYLPREDNISOLONE SODIUM SUCC 125 MG IJ SOLR: 125.0000 mg | Freq: Once | INTRAMUSCULAR | Status: DC | PRN

## 2020-02-01 MED ORDER — DIPHENHYDRAMINE HCL 50 MG/ML IJ SOLN: 50.0000 mg | Freq: Once | INTRAMUSCULAR | Status: DC | PRN

## 2020-02-01 MED ORDER — SODIUM CHLORIDE 0.9 % IV SOLN: INTRAVENOUS | Status: DC | PRN

## 2020-02-01 MED ORDER — ALBUTEROL SULFATE HFA 108 (90 BASE) MCG/ACT IN AERS: 2.0000 | INHALATION_SPRAY | Freq: Once | RESPIRATORY_TRACT | Status: DC | PRN

## 2020-02-01 NOTE — Discharge Instructions (Signed)
10 Things You Can Do to Manage Your COVID-19 Symptoms at Home If you have possible or confirmed COVID-19: 1. Stay home from work and school. And stay away from other public places. If you must go out, avoid using any kind of public transportation, ridesharing, or taxis. 2. Monitor your symptoms carefully. If your symptoms get worse, call your healthcare provider immediately. 3. Get rest and stay hydrated. 4. If you have a medical appointment, call the healthcare provider ahead of time and tell them that you have or may have COVID-19. 5. For medical emergencies, call 911 and notify the dispatch personnel that you have or may have COVID-19. 6. Cover your cough and sneezes with a tissue or use the inside of your elbow. 7. Wash your hands often with soap and water for at least 20 seconds or clean your hands with an alcohol-based hand sanitizer that contains at least 60% alcohol. 8. As much as possible, stay in a specific room and away from other people in your home. Also, you should use a separate bathroom, if available. If you need to be around other people in or outside of the home, wear a mask. 9. Avoid sharing personal items with other people in your household, like dishes, towels, and bedding. 10. Clean all surfaces that are touched often, like counters, tabletops, and doorknobs. Use household cleaning sprays or wipes according to the label instructions. cdc.gov/coronavirus 08/23/2018 This information is not intended to replace advice given to you by your health care provider. Make sure you discuss any questions you have with your health care provider. Document Revised: 01/25/2019 Document Reviewed: 01/25/2019 Elsevier Patient Education  2020 Elsevier Inc. What types of side effects do monoclonal antibody drugs cause?  Common side effects  In general, the more common side effects caused by monoclonal antibody drugs include: . Allergic reactions, such as hives or itching . Flu-like signs and  symptoms, including chills, fatigue, fever, and muscle aches and pains . Nausea, vomiting . Diarrhea . Skin rashes . Low blood pressure   The CDC is recommending patients who receive monoclonal antibody treatments wait at least 90 days before being vaccinated.  Currently, there are no data on the safety and efficacy of mRNA COVID-19 vaccines in persons who received monoclonal antibodies or convalescent plasma as part of COVID-19 treatment. Based on the estimated half-life of such therapies as well as evidence suggesting that reinfection is uncommon in the 90 days after initial infection, vaccination should be deferred for at least 90 days, as a precautionary measure until additional information becomes available, to avoid interference of the antibody treatment with vaccine-induced immune responses. If you have any questions or concerns after the infusion please call the Advanced Practice Provider on call at [phone]. This number is ONLY intended for your use regarding questions or concerns about the infusion post-treatment side-effects.  Please do not provide this number to others for use. For return to work notes please contact your primary care provider.   If someone you know is interested in receiving treatment please have them call the COVID hotline at [phone].   

## 2020-02-01 NOTE — Progress Notes (Signed)
Patient reviewed Fact Sheet for Patients, Parents, and Caregivers for Emergency Use Authorization (EUA) of Regen-Cov for the Treatment of Coronavirus.  Patient also reviewed and is agreeable to the estimated cost of treatment.  Patient is agreeable to proceed.   

## 2020-02-01 NOTE — Progress Notes (Addendum)
  Diagnosis: COVID-19  Physician:  Dr. Patrick Wright  Procedure:   Medication fact sheet provided to patient; all questions answered.  Allergies reviewed with patient.  IV placed.  Regen-Cov administered via IV infusion.  Complications: No immediate complications noted.  Discharge: Discharged home   Abigail Wright 02/01/2020   

## 2020-02-04 ENCOUNTER — Telehealth: Payer: Self-pay | Admitting: Medical

## 2020-02-04 NOTE — Telephone Encounter (Signed)
Opened to review 

## 2020-02-28 ENCOUNTER — Encounter: Payer: Self-pay | Admitting: Adult Health

## 2020-02-28 ENCOUNTER — Telehealth (INDEPENDENT_AMBULATORY_CARE_PROVIDER_SITE_OTHER): Payer: PRIVATE HEALTH INSURANCE | Admitting: Adult Health

## 2020-02-28 DIAGNOSIS — F411 Generalized anxiety disorder: Secondary | ICD-10-CM | POA: Diagnosis not present

## 2020-02-28 DIAGNOSIS — F331 Major depressive disorder, recurrent, moderate: Secondary | ICD-10-CM | POA: Diagnosis not present

## 2020-02-28 DIAGNOSIS — F909 Attention-deficit hyperactivity disorder, unspecified type: Secondary | ICD-10-CM

## 2020-02-28 MED ORDER — CLONAZEPAM 0.5 MG PO TABS: 0.5000 mg | ORAL_TABLET | Freq: Every day | ORAL | 2 refills | Status: DC

## 2020-02-28 MED ORDER — AMPHETAMINE-DEXTROAMPHETAMINE 30 MG PO TABS
30.0000 mg | ORAL_TABLET | Freq: Two times a day (BID) | ORAL | 0 refills | Status: DC
Start: 2020-03-27 — End: 2020-07-04

## 2020-02-28 MED ORDER — AMPHETAMINE-DEXTROAMPHETAMINE 30 MG PO TABS
30.0000 mg | ORAL_TABLET | Freq: Two times a day (BID) | ORAL | 0 refills | Status: DC
Start: 2020-02-28 — End: 2020-07-04

## 2020-02-28 MED ORDER — AMPHETAMINE-DEXTROAMPHETAMINE 30 MG PO TABS: 30.0000 mg | ORAL_TABLET | Freq: Two times a day (BID) | ORAL | 0 refills | Status: DC

## 2020-02-28 NOTE — Progress Notes (Signed)
Abigail Wright [id number] 1965/04/20 55 y.o.  Virtual Visit via Video Note  I connected with pt @ on 02/28/20 at  1:00 PM EST by a video enabled telemedicine application and verified that I am speaking with the correct person using two identifiers.   I discussed the limitations of evaluation and management by telemedicine and the availability of in person appointments. The patient expressed understanding and agreed to proceed.  I discussed the assessment and treatment plan with the patient. The patient was provided an opportunity to ask questions and all were answered. The patient agreed with the plan and demonstrated an understanding of the instructions.   The patient was advised to call back or seek an in-person evaluation if the symptoms worsen or if the condition fails to improve as anticipated.  I provided 30 minutes of non-face-to-face time during this encounter.  The patient was located at home.  The provider was located at [place].   Aloha Gell, NP   Subjective:   Patient ID:  Abigail Wright is a 55 y.o. (DOB 1965-09-26) female.  Chief Complaint: No chief complaint on file.   HPI Adaira Mcglinchey presents for follow-up of Anxiety, ADHD, and Depression.  Describes mood today as "ok". Pleasant. Mood symptoms - denies depression, anxiety, and irritability. Stating "I'm doing alright". Recovering from Covid pnuemonia. Stating "I was sick for about a month". Keeping granddaughter during the day. Planning to see PCP for yearly physical. Stable interest and motivation. Taking medications as prescribed.  Energy levels stable. Active, does not have a regular exercise routine.   Enjoys some usual interests and activities. Married. Lives with husband of 67 years. 2 daughters local. Appetite adequate - eating better. Weight stable.  Sleeps well most nights. Averages 7 to 8  hours.  Focus and concentration stable. Completing tasks. Managing aspects of household. Works from  home.   Denies SI or HI.  Denies AH or VH  Review of Systems:  Review of Systems  Musculoskeletal: Negative for gait problem.  Neurological: Negative for tremors.  Psychiatric/Behavioral:       Please refer to HPI    Medications: I have reviewed the patient's current medications.  Current Outpatient Medications  Medication Sig Dispense Refill  . amphetamine-dextroamphetamine (ADDERALL) 30 MG tablet Take 1 tablet by mouth 2 (two) times daily. 60 tablet 0  . amphetamine-dextroamphetamine (ADDERALL) 30 MG tablet Take 1 tablet by mouth 2 (two) times daily. 60 tablet 0  . amphetamine-dextroamphetamine (ADDERALL) 30 MG tablet Take 1 tablet by mouth 2 (two) times daily. 60 tablet 0  . buPROPion (WELLBUTRIN XL) 300 MG 24 hr tablet Take 1 tablet (300 mg total) by mouth daily. 90 tablet 3  . citalopram (CELEXA) 20 MG tablet TAKE 1 TABLET BY MOUTH EACH DAY 90 tablet 3  . clonazePAM (KLONOPIN) 0.5 MG tablet Take 1 tablet (0.5 mg total) by mouth at bedtime. 90 tablet 2  . fluticasone (FLONASE) 50 MCG/ACT nasal spray Place into the nose.    Marland Kitchen glucosamine-chondroitin 500-400 MG tablet Take by mouth.    . meloxicam (MOBIC) 15 MG tablet Take 1 tablet (15 mg total) by mouth daily. 30 tablet 0  . Multiple Vitamins-Minerals (MULTIVITAMIN ADULT EXTRA C PO) Take by mouth.    . predniSONE (STERAPRED UNI-PAK 21 TAB) 10 MG (21) TBPK tablet Take as directed 21 tablet 0   No current facility-administered medications for this visit.    Medication Side Effects: None  Allergies: No Known Allergies  Past Medical History:  Diagnosis  Date  . ADD (attention deficit disorder)   . Anxiety   . Blood transfusion without reported diagnosis    1993  . Depression   . GERD (gastroesophageal reflux disease)    in past but recently controlled with diet and exercise.  Marland Kitchen Hyperlipidemia     Family History  Problem Relation Age of Onset  . Cancer Mother   . Diabetes Father   . Heart disease Paternal Uncle   .  Hypertension Paternal Uncle   . Colon cancer Neg Hx   . Esophageal cancer Neg Hx   . Rectal cancer Neg Hx   . Stomach cancer Neg Hx     Social History   Socioeconomic History  . Marital status: Married    Spouse name: Not on file  . Number of children: Not on file  . Years of education: Not on file  . Highest education level: Not on file  Occupational History  . Not on file  Tobacco Use  . Smoking status: Former Games developer  . Smokeless tobacco: Never Used  Vaping Use  . Vaping Use: Never used  Substance and Sexual Activity  . Alcohol use: Yes  . Drug use: No  . Sexual activity: Yes    Partners: Male    Birth control/protection: None, Post-menopausal  Other Topics Concern  . Not on file  Social History Narrative  . Not on file   Social Determinants of Health   Financial Resource Strain: Not on file  Food Insecurity: Not on file  Transportation Needs: Not on file  Physical Activity: Not on file  Stress: Not on file  Social Connections: Not on file  Intimate Partner Violence: Not on file    Past Medical History, Surgical history, Social history, and Family history were reviewed and updated as appropriate.   Please see review of systems for further details on the patient's review from today.   Objective:   Physical Exam:  There were no vitals taken for this visit.  Physical Exam Neurological:     Mental Status: She is alert and oriented to person, place, and time.     Cranial Nerves: No dysarthria.  Psychiatric:        Attention and Perception: Attention and perception normal.        Mood and Affect: Mood normal.        Speech: Speech normal.        Behavior: Behavior is cooperative.        Thought Content: Thought content normal. Thought content is not paranoid or delusional. Thought content does not include homicidal or suicidal ideation. Thought content does not include homicidal or suicidal plan.        Cognition and Memory: Cognition and memory normal.         Judgment: Judgment normal.     Comments: Insight intact     Lab Review:     Component Value Date/Time   NA 140 07/26/2017 0903   K 4.7 07/26/2017 0903   CL 102 07/26/2017 0903   CO2 31 07/26/2017 0903   GLUCOSE 95 07/26/2017 0903   BUN 13 07/26/2017 0903   CREATININE 0.74 07/26/2017 0903   CALCIUM 9.6 07/26/2017 0903   PROT 6.5 07/26/2017 0903   ALBUMIN 4.3 07/26/2017 0903   AST 18 07/26/2017 0903   ALT 19 07/26/2017 0903   ALKPHOS 52 07/26/2017 0903   BILITOT 0.6 07/26/2017 0903       Component Value Date/Time   WBC 5.5 07/26/2017 2706  RBC 4.92 07/26/2017 0903   HGB 14.5 07/26/2017 0903   HCT 43.4 07/26/2017 0903   PLT 292.0 07/26/2017 0903   MCV 88.3 07/26/2017 0903   MCHC 33.4 07/26/2017 0903   RDW 13.8 07/26/2017 0903    No results found for: POCLITH, LITHIUM   No results found for: PHENYTOIN, PHENOBARB, VALPROATE, CBMZ   .res Assessment: Plan:    Plan:  1. Adderall 30mg  BID 2. Celexa 20 mg daily 3. Wellbutrin XL 300mg  4. Clonazepam 0.5mg  daily   Continue to monitor BP - upcoming physical  RTC 3 months  Patient advised to contact office with any questions, adverse effects, or acute worsening in signs and symptoms.  Discussed potential benefits, risk, and side effects of benzodiazepines to include potential risk of tolerance and dependence, as well as possible drowsiness.  Advised patient not to drive if experiencing drowsiness and to take lowest possible effective dose to minimize risk of dependence and tolerance.  Discussed potential benefits, risks, and side effects of stimulants with patient to include increased heart rate, palpitations, insomnia, increased anxiety, increased irritability, or decreased appetite.  Instructed patient to contact office if experiencing any significant tolerability issues.  Diagnoses and all orders for this visit:  Generalized anxiety disorder  Major depressive disorder, recurrent episode, moderate  (HCC)  Attention deficit hyperactivity disorder (ADHD), unspecified ADHD type     Please see After Visit Summary for patient specific instructions.  No future appointments.  No orders of the defined types were placed in this encounter.     -------------------------------

## 2020-05-22 ENCOUNTER — Other Ambulatory Visit: Payer: Self-pay | Admitting: Adult Health

## 2020-05-22 DIAGNOSIS — F411 Generalized anxiety disorder: Secondary | ICD-10-CM

## 2020-05-22 DIAGNOSIS — F331 Major depressive disorder, recurrent, moderate: Secondary | ICD-10-CM

## 2020-07-04 ENCOUNTER — Encounter: Payer: Self-pay | Admitting: Adult Health

## 2020-07-04 ENCOUNTER — Telehealth (INDEPENDENT_AMBULATORY_CARE_PROVIDER_SITE_OTHER): Payer: PRIVATE HEALTH INSURANCE | Admitting: Adult Health

## 2020-07-04 VITALS — BP 124/90

## 2020-07-04 DIAGNOSIS — F411 Generalized anxiety disorder: Secondary | ICD-10-CM | POA: Diagnosis not present

## 2020-07-04 DIAGNOSIS — F909 Attention-deficit hyperactivity disorder, unspecified type: Secondary | ICD-10-CM

## 2020-07-04 DIAGNOSIS — F331 Major depressive disorder, recurrent, moderate: Secondary | ICD-10-CM | POA: Diagnosis not present

## 2020-07-04 MED ORDER — CITALOPRAM HYDROBROMIDE 20 MG PO TABS: ORAL_TABLET | ORAL | 3 refills | Status: DC

## 2020-07-04 MED ORDER — CLONAZEPAM 0.5 MG PO TABS: 0.5000 mg | ORAL_TABLET | Freq: Every day | ORAL | 2 refills | Status: DC

## 2020-07-04 MED ORDER — AMPHETAMINE-DEXTROAMPHETAMINE 30 MG PO TABS
30.0000 mg | ORAL_TABLET | Freq: Two times a day (BID) | ORAL | 0 refills | Status: DC
Start: 2020-08-29 — End: 2020-09-22

## 2020-07-04 MED ORDER — BUPROPION HCL ER (XL) 300 MG PO TB24: ORAL_TABLET | ORAL | 3 refills | Status: DC

## 2020-07-04 MED ORDER — AMPHETAMINE-DEXTROAMPHETAMINE 30 MG PO TABS
30.0000 mg | ORAL_TABLET | Freq: Two times a day (BID) | ORAL | 0 refills | Status: DC
Start: 2020-08-01 — End: 2020-09-22

## 2020-07-04 MED ORDER — AMPHETAMINE-DEXTROAMPHETAMINE 30 MG PO TABS
30.0000 mg | ORAL_TABLET | Freq: Two times a day (BID) | ORAL | 0 refills | Status: DC
Start: 2020-07-04 — End: 2020-09-22

## 2020-07-04 NOTE — Progress Notes (Signed)
Abigail Wright [id number] October 28, 1965 55 y.o.  Subjective:   Patient ID:  Abigail Wright is a 55 y.o. (DOB January 28, 1966) female.  Chief Complaint: No chief complaint on file.   HPI Abigail Wright presents to the office today for follow-up of anxiety, ADHD, and depression.  Describes mood today as "ok". Pleasant. Mood symptoms - denies depression, anxiety, and irritability. Stating "I'm doing pretty good". Thinking about mother - passed away last 06/21/2022. Keeping granddaughter during the week - "that helps me seeing her happy". Feels like medications continue to work well for her. Stable interest and motivation. Taking medications as prescribed.  Energy levels stable. Active, does not have a regular exercise routine - "running after granddaughter".   Enjoys some usual interests and activities. Married. Lives with husband of 33 years. Spending time with family - 2 daughters - grandchild. Appetite adequate - eating better. Weight stable.  Sleeps well most nights. Averages 7 to 8  hours.  Focus and concentration stable. Completing tasks. Managing aspects of household. 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   Denies SI or HI.  Denies AH or VH    PHQ2-9   Flowsheet Row Office Visit from 11/30/2016 in [facility]  PHQ-2 Total Score 0       Review of Systems:  Review of Systems  Musculoskeletal: Negative for gait problem.  Neurological: Negative for tremors.  Psychiatric/Behavioral:       Please refer to HPI    Medications: I have reviewed the patient's current medications.  Current Outpatient Medications  Medication Sig Dispense Refill  . amphetamine-dextroamphetamine (ADDERALL) 30 MG tablet Take 1 tablet by mouth 2 (two) times daily. 60 tablet 0  . [START ON 08/01/2020] amphetamine-dextroamphetamine (ADDERALL) 30 MG tablet Take 1 tablet by mouth 2 (two) times daily. 60 tablet 0  . [START ON 08/29/2020] amphetamine-dextroamphetamine (ADDERALL) 30 MG  tablet Take 1 tablet by mouth 2 (two) times daily. 60 tablet 0  . buPROPion (WELLBUTRIN XL) 300 MG 24 hr tablet TAKE 1 TABLET BY MOUTH EACH DAY 90 tablet 3  . citalopram (CELEXA) 20 MG tablet TAKE 1 TABLET BY MOUTH EACH DAY 90 tablet 3  . clonazePAM (KLONOPIN) 0.5 MG tablet Take 1 tablet (0.5 mg total) by mouth at bedtime. 90 tablet 2  . fluticasone (FLONASE) 50 MCG/ACT nasal spray Place into the nose.    Marland Kitchen glucosamine-chondroitin 500-400 MG tablet Take by mouth.    . meloxicam (MOBIC) 15 MG tablet Take 1 tablet (15 mg total) by mouth daily. 30 tablet 0  . Multiple Vitamins-Minerals (MULTIVITAMIN ADULT EXTRA C PO) Take by mouth.    . predniSONE (STERAPRED UNI-PAK 21 TAB) 10 MG (21) TBPK tablet Take as directed 21 tablet 0   No current facility-administered medications for this visit.    Medication Side Effects: None  Allergies: No Known Allergies  Past Medical History:  Diagnosis Date  . ADD (attention deficit disorder)   . Anxiety   . Blood transfusion without reported diagnosis    1993  . Depression   . GERD (gastroesophageal reflux disease)    in past but recently controlled with diet and exercise.  Marland Kitchen Hyperlipidemia     Past Medical History, Surgical history, Social history, and Family history were reviewed and updated as appropriate.   Please see review of systems for further details on the patient's review from today.   Objective:   Physical Exam:  BP 124/90   Physical Exam Constitutional:  General: She is not in acute distress. Musculoskeletal:        General: No deformity.  Neurological:     Mental Status: She is alert and oriented to person, place, and time.     Coordination: Coordination normal.  Psychiatric:        Attention and Perception: Attention and perception normal. She does not perceive auditory or visual hallucinations.        Mood and Affect: Mood normal. Mood is not anxious or depressed. Affect is not labile, blunt, angry or inappropriate.         Speech: Speech normal.        Behavior: Behavior normal.        Thought Content: Thought content normal. Thought content is not paranoid or delusional. Thought content does not include homicidal or suicidal ideation. Thought content does not include homicidal or suicidal plan.        Cognition and Memory: Cognition and memory normal.        Judgment: Judgment normal.     Comments: Insight intact     Lab Review:     Component Value Date/Time   NA 140 07/26/2017 0903   K 4.7 07/26/2017 0903   CL 102 07/26/2017 0903   CO2 31 07/26/2017 0903   GLUCOSE 95 07/26/2017 0903   BUN 13 07/26/2017 0903   CREATININE 0.74 07/26/2017 0903   CALCIUM 9.6 07/26/2017 0903   PROT 6.5 07/26/2017 0903   ALBUMIN 4.3 07/26/2017 0903   AST 18 07/26/2017 0903   ALT 19 07/26/2017 0903   ALKPHOS 52 07/26/2017 0903   BILITOT 0.6 07/26/2017 0903       Component Value Date/Time   WBC 5.5 07/26/2017 0903   RBC 4.92 07/26/2017 0903   HGB 14.5 07/26/2017 0903   HCT 43.4 07/26/2017 0903   PLT 292.0 07/26/2017 0903   MCV 88.3 07/26/2017 0903   MCHC 33.4 07/26/2017 0903   RDW 13.8 07/26/2017 0903    No results found for: POCLITH, LITHIUM   No results found for: PHENYTOIN, PHENOBARB, VALPROATE, CBMZ   .res Assessment: Plan:    Plan:  1. Adderall 30mg  BID 2. Celexa 20 mg daily 3. Wellbutrin XL 300mg  4. Clonazepam 0.5mg  daily   Continue to monitor BP - 124/90  RTC 3 months  Patient advised to contact office with any questions, adverse effects, or acute worsening in signs and symptoms.  Discussed potential benefits, risk, and side effects of benzodiazepines to include potential risk of tolerance and dependence, as well as possible drowsiness.  Advised patient not to drive if experiencing drowsiness and to take lowest possible effective dose to minimize risk of dependence and tolerance.  Discussed potential benefits, risks, and side effects of stimulants with patient to include increased  heart rate, palpitations, insomnia, increased anxiety, increased irritability, or decreased appetite.  Instructed patient to contact office if experiencing any significant tolerability issues.    Diagnoses and all orders for this visit:  Attention deficit hyperactivity disorder (ADHD), unspecified ADHD type  Major depressive disorder, recurrent episode, moderate (HCC) -     amphetamine-dextroamphetamine (ADDERALL) 30 MG tablet; Take 1 tablet by mouth 2 (two) times daily. -     amphetamine-dextroamphetamine (ADDERALL) 30 MG tablet; Take 1 tablet by mouth 2 (two) times daily. -     amphetamine-dextroamphetamine (ADDERALL) 30 MG tablet; Take 1 tablet by mouth 2 (two) times daily. -     buPROPion (WELLBUTRIN XL) 300 MG 24 hr tablet; TAKE 1 TABLET BY MOUTH EACH  DAY -     citalopram (CELEXA) 20 MG tablet; TAKE 1 TABLET BY MOUTH EACH DAY  Generalized anxiety disorder -     clonazePAM (KLONOPIN) 0.5 MG tablet; Take 1 tablet (0.5 mg total) by mouth at bedtime. -     buPROPion (WELLBUTRIN XL) 300 MG 24 hr tablet; TAKE 1 TABLET BY MOUTH EACH DAY -     citalopram (CELEXA) 20 MG tablet; TAKE 1 TABLET BY MOUTH EACH DAY     Please see After Visit Summary for patient specific instructions.  No future appointments.  No orders of the defined types were placed in this encounter.   -------------------------------

## 2020-08-09 ENCOUNTER — Other Ambulatory Visit: Payer: Self-pay

## 2020-08-09 ENCOUNTER — Emergency Department (HOSPITAL_BASED_OUTPATIENT_CLINIC_OR_DEPARTMENT_OTHER)
Admission: EM | Admit: 2020-08-09 | Discharge: 2020-08-09 | Disposition: A | Discharge status: Home / Self Care | Payer: No Typology Code available for payment source | Attending: Emergency Medicine | Admitting: Emergency Medicine

## 2020-08-09 ENCOUNTER — Encounter (HOSPITAL_BASED_OUTPATIENT_CLINIC_OR_DEPARTMENT_OTHER): Payer: Self-pay | Admitting: *Deleted

## 2020-08-09 DIAGNOSIS — R220 Localized swelling, mass and lump, head: Secondary | ICD-10-CM | POA: Diagnosis present

## 2020-08-09 DIAGNOSIS — Z87891 Personal history of nicotine dependence: Secondary | ICD-10-CM | POA: Diagnosis not present

## 2020-08-09 DIAGNOSIS — L03211 Cellulitis of face: Secondary | ICD-10-CM | POA: Insufficient documentation

## 2020-08-09 MED ORDER — CEPHALEXIN 500 MG PO CAPS: 500.0000 mg | ORAL_CAPSULE | Freq: Four times a day (QID) | ORAL | 0 refills | Status: AC

## 2020-08-09 MED ORDER — CEPHALEXIN 250 MG PO CAPS
500.0000 mg | ORAL_CAPSULE | Freq: Once | ORAL | Status: AC
  Administered 2020-08-09: 23:00:00 500 mg via ORAL
  Filled 2020-08-09: qty 2

## 2020-08-09 NOTE — Discharge Instructions (Addendum)
You were evaluated in the Emergency Department and after careful evaluation, we did not find any emergent condition requiring admission or further testing in the hospital.  Your exam/testing today was overall reassuring.  Symptoms seem to be due to an infection of the skin.  Please take the Keflex antibiotic as directed.  Use cold compresses to help with the inflammation to the area.  Use Tylenol or Motrin for pain.  If not resolved in 1 to 2 weeks, recommend follow-up with dermatology.  Please return to the Emergency Department if you experience any worsening of your condition.  Thank you for allowing [place] to be a part of your care.

## 2020-08-09 NOTE — ED Triage Notes (Signed)
Pt noticed small red ?pimple on her right check Tuesday night. States she has tried to pop it x 2. Today it is larger (~ 2.5 x 1cm) next to nose. Reports she is having pain up into her right eye socket and right temple. Taking ibuprofen with some relief, last dose 600mg  3 hours pta

## 2020-08-09 NOTE — ED Provider Notes (Signed)
[facility] Emergency Department Provider Note MRN:  [id number]  Arrival date & time: 08/09/20     Chief Complaint   Wound Check   History of Present Illness   Abigail Wright is a 55 y.o. year-old female with a history of GERD, hyperlipidemia presenting to the ED with chief complaint of wound check.  Patient started developing a pimple on the right side of her face 3 to 4 days ago, tried to express the head which caused pain and minimal output.  Since then having worsening redness, pain, swelling to the area.  Pain located on the right cheek.  Denies any fever, no other complaints, no other rash.  Review of Systems  A problem-focused ROS was performed. Positive for wound.  Patient denies fever.  Patient's Health History    Past Medical History:  Diagnosis Date   ADD (attention deficit disorder)    Anxiety    Blood transfusion without reported diagnosis    1993   Depression    GERD (gastroesophageal reflux disease)    in past but recently controlled with diet and exercise.   Hyperlipidemia     Past Surgical History:  Procedure Laterality Date   ABDOMINAL HYSTERECTOMY     BUNIONECTOMY Left    CARPAL TUNNEL RELEASE Bilateral    cyst removal from left hand Left    FOOT SURGERY     KNEE CARTILAGE SURGERY     planters fascitis     TENDON REPAIR Left 03/21/2017   Left hand    TONSILLECTOMY      Family History  Problem Relation Age of Onset   Cancer Mother    Diabetes Father    Heart disease Paternal Uncle    Hypertension Paternal Uncle    Colon cancer Neg Hx    Esophageal cancer Neg Hx    Rectal cancer Neg Hx    Stomach cancer Neg Hx     Social History   Socioeconomic History   Marital status: Married    Spouse name: Not on file   Number of children: Not on file   Years of education: Not on file   Highest education level: Not on file  Occupational History   Not on file  Tobacco Use   Smoking status: Former    Pack years: 0.00    Smokeless tobacco: Never  Vaping Use   Vaping Use: Some days  Substance and Sexual Activity   Alcohol use: Not Currently   Drug use: No   Sexual activity: Yes    Partners: Male    Birth control/protection: None, Post-menopausal  Other Topics Concern   Not on file  Social History Narrative   Not on file   Social Determinants of Health   Financial Resource Strain: Not on file  Food Insecurity: Not on file  Transportation Needs: Not on file  Physical Activity: Not on file  Stress: Not on file  Social Connections: Not on file  Intimate Partner Violence: Not on file     Physical Exam   Vitals:   08/09/20 2117 08/09/20 2314  BP: (!) 144/94 131/87  Pulse: 92 84  Resp: 18 16  Temp: 98.4 F (36.9 C)   SpO2: 100% 99%    CONSTITUTIONAL: Well-appearing, NAD NEURO:  Alert and oriented x 3, no focal deficits EYES:  eyes equal and reactive ENT/NECK:  no LAD, no JVD CARDIO: Regular rate, well-perfused, normal S1 and S2 PULM:  CTAB no wheezing or rhonchi GI/GU:  normal bowel sounds,  non-distended, non-tender MSK/SPINE:  No gross deformities, no edema SKIN: 3 cm area of erythema and induration to the right zygoma, no fluctuance PSYCH:  Appropriate speech and behavior  *Additional and/or pertinent findings included in MDM below  Diagnostic and Interventional Summary    EKG Interpretation  Date/Time:    Ventricular Rate:    PR Interval:    QRS Duration:   QT Interval:    QTC Calculation:   R Axis:     Text Interpretation:          Labs Reviewed - No data to display  No orders to display    Medications  cephALEXin (KEFLEX) capsule 500 mg (has no administration in time range)     Procedures  /  Critical Care Procedures  ED Course and Medical Decision Making  I have reviewed the triage vital signs, the nursing notes, and pertinent available records from the EMR.  Listed above are laboratory and imaging tests that I personally ordered, reviewed, and  interpreted and then considered in my medical decision making (see below for details).  Exam seems consistent with a small area of cellulitis, there is no fluctuance to suggest abscess or need for I&D.  Normal vital signs, no systemic symptoms, appropriate for a trial of antibiotics.       Elmer Sow. Pilar Plate, MD [facility] Emergency Medicine [facility] [email or web address]  Final Clinical Impressions(s) / ED Diagnoses     ICD-10-CM   1. Cellulitis of face  L03.211       ED Discharge Orders          Ordered    cephALEXin (KEFLEX) 500 MG capsule  4 times daily        08/09/20 2321             Discharge Instructions Discussed with and Provided to Patient:    Discharge Instructions      You were evaluated in the Emergency Department and after careful evaluation, we did not find any emergent condition requiring admission or further testing in the hospital.  Your exam/testing today was overall reassuring.  Symptoms seem to be due to an infection of the skin.  Please take the Keflex antibiotic as directed.  Use cold compresses to help with the inflammation to the area.  Use Tylenol or Motrin for pain.  If not resolved in 1 to 2 weeks, recommend follow-up with dermatology.  Please return to the Emergency Department if you experience any worsening of your condition.  Thank you for allowing [place] to be a part of your care.        Sabas Sous, MD 08/09/20 [phone]

## 2020-08-09 NOTE — ED Notes (Signed)
AMA process explained and MSE waiver signed by patient. 

## 2020-08-11 ENCOUNTER — Other Ambulatory Visit: Payer: Self-pay

## 2020-08-11 ENCOUNTER — Emergency Department (HOSPITAL_BASED_OUTPATIENT_CLINIC_OR_DEPARTMENT_OTHER): Payer: No Typology Code available for payment source

## 2020-08-11 ENCOUNTER — Emergency Department (HOSPITAL_BASED_OUTPATIENT_CLINIC_OR_DEPARTMENT_OTHER)
Admission: EM | Admit: 2020-08-11 | Discharge: 2020-08-11 | Disposition: A | Discharge status: Home / Self Care | Payer: No Typology Code available for payment source | Attending: Emergency Medicine | Admitting: Emergency Medicine

## 2020-08-11 ENCOUNTER — Encounter (HOSPITAL_BASED_OUTPATIENT_CLINIC_OR_DEPARTMENT_OTHER): Payer: Self-pay

## 2020-08-11 DIAGNOSIS — Z87891 Personal history of nicotine dependence: Secondary | ICD-10-CM | POA: Diagnosis not present

## 2020-08-11 DIAGNOSIS — Z79899 Other long term (current) drug therapy: Secondary | ICD-10-CM | POA: Diagnosis not present

## 2020-08-11 DIAGNOSIS — H9201 Otalgia, right ear: Secondary | ICD-10-CM | POA: Diagnosis not present

## 2020-08-11 DIAGNOSIS — R21 Rash and other nonspecific skin eruption: Secondary | ICD-10-CM

## 2020-08-11 DIAGNOSIS — R220 Localized swelling, mass and lump, head: Secondary | ICD-10-CM | POA: Diagnosis present

## 2020-08-11 DIAGNOSIS — L03211 Cellulitis of face: Secondary | ICD-10-CM | POA: Diagnosis not present

## 2020-08-11 MED ORDER — PROMETHAZINE HCL 25 MG PO TABS
25.0000 mg | ORAL_TABLET | Freq: Four times a day (QID) | ORAL | 0 refills | Status: DC | PRN
Start: 2020-08-11 — End: 2020-09-16

## 2020-08-11 MED ORDER — VALACYCLOVIR HCL 1 G PO TABS: 1000.0000 mg | ORAL_TABLET | Freq: Three times a day (TID) | ORAL | 0 refills | Status: AC

## 2020-08-11 MED ORDER — DOXYCYCLINE HYCLATE 100 MG PO CAPS: 100.0000 mg | ORAL_CAPSULE | Freq: Two times a day (BID) | ORAL | 0 refills | Status: DC

## 2020-08-11 MED ORDER — FLUORESCEIN SODIUM 1 MG OP STRP
1.0000 | ORAL_STRIP | Freq: Once | OPHTHALMIC | Status: AC
  Administered 2020-08-11: 14:00:00 1 via OPHTHALMIC
  Filled 2020-08-11: qty 1

## 2020-08-11 MED ORDER — IOHEXOL 300 MG/ML  SOLN
100.0000 mL | Freq: Once | INTRAMUSCULAR | Status: AC | PRN
  Administered 2020-08-11: 14:00:00 75 mL via INTRAVENOUS

## 2020-08-11 NOTE — ED Provider Notes (Signed)
[facility] EMERGENCY DEPARTMENT Provider Note   CSN: [id number] Arrival date & time: 08/11/20  1116     History Chief Complaint  Patient presents with   Facial Swelling    Abigail Wright is a 55 y.o. female.  Pt here for recheck of infection to her right cheek.  Pt reports she now has sores in her mouth.  Pt reports no visual changes.  Pt reports pain goes to her right ear   The history is provided by the patient. No language interpreter was used.      Past Medical History:  Diagnosis Date   ADD (attention deficit disorder)    Anxiety    Blood transfusion without reported diagnosis    1993   Depression    GERD (gastroesophageal reflux disease)    in past but recently controlled with diet and exercise.   Hyperlipidemia     Patient Active Problem List   Diagnosis Date Noted   Pain in left knee 11/04/2017   Benign paroxysmal positional vertigo of left ear 05/15/2015   Family history of breast cancer in mother 08/02/2011    Past Surgical History:  Procedure Laterality Date   ABDOMINAL HYSTERECTOMY     BUNIONECTOMY Left    CARPAL TUNNEL RELEASE Bilateral    cyst removal from left hand Left    FOOT SURGERY     KNEE CARTILAGE SURGERY     planters fascitis     TENDON REPAIR Left 03/21/2017   Left hand    TONSILLECTOMY       OB History   No obstetric history on file.     Family History  Problem Relation Age of Onset   Cancer Mother    Diabetes Father    Heart disease Paternal Uncle    Hypertension Paternal Uncle    Colon cancer Neg Hx    Esophageal cancer Neg Hx    Rectal cancer Neg Hx    Stomach cancer Neg Hx     Social History   Tobacco Use   Smoking status: Former    Pack years: 0.00   Smokeless tobacco: Never  Vaping Use   Vaping Use: Some days  Substance Use Topics   Alcohol use: Not Currently   Drug use: No    Home Medications Prior to Admission medications   Medication Sig Start Date End Date Taking? Authorizing Provider   doxycycline (VIBRAMYCIN) 100 MG capsule Take 1 capsule (100 mg total) by mouth 2 (two) times daily. 08/11/20  Yes Elson Areas, PA-C  promethazine (PHENERGAN) 25 MG tablet Take 1 tablet (25 mg total) by mouth every 6 (six) hours as needed for nausea or vomiting. 08/11/20  Yes Elson Areas, PA-C  valACYclovir (VALTREX) 1000 MG tablet Take 1 tablet (1,000 mg total) by mouth 3 (three) times daily for 7 days. 08/11/20 08/18/20 Yes Cheron Schaumann K, PA-C  amphetamine-dextroamphetamine (ADDERALL) 30 MG tablet Take 1 tablet by mouth 2 (two) times daily. 07/04/20   Mozingo, Thereasa Solo, NP  amphetamine-dextroamphetamine (ADDERALL) 30 MG tablet Take 1 tablet by mouth 2 (two) times daily. 08/01/20   Mozingo, Thereasa Solo, NP  amphetamine-dextroamphetamine (ADDERALL) 30 MG tablet Take 1 tablet by mouth 2 (two) times daily. 08/29/20   Mozingo, Thereasa Solo, NP  buPROPion (WELLBUTRIN XL) 300 MG 24 hr tablet TAKE 1 TABLET BY MOUTH EACH DAY 07/04/20   Mozingo, Thereasa Solo, NP  cephALEXin (KEFLEX) 500 MG capsule Take 1 capsule (500 mg total) by mouth 4 (four) times daily  for 7 days. 08/09/20 08/16/20  Sabas Sous, MD  citalopram (CELEXA) 20 MG tablet TAKE 1 TABLET BY MOUTH EACH DAY 07/04/20   Mozingo, Thereasa Solo, NP  clonazePAM (KLONOPIN) 0.5 MG tablet Take 1 tablet (0.5 mg total) by mouth at bedtime. 07/04/20   Mozingo, Thereasa Solo, NP  fluticasone (FLONASE) 50 MCG/ACT nasal spray Place into the nose.    [provider]  glucosamine-chondroitin 500-400 MG tablet Take by mouth.    [provider]  meloxicam (MOBIC) 15 MG tablet Take 1 tablet (15 mg total) by mouth daily. 11/17/18   Asencion Islam, DPM  Multiple Vitamins-Minerals (MULTIVITAMIN ADULT EXTRA C PO) Take by mouth.    [provider]  predniSONE (STERAPRED UNI-PAK 21 TAB) 10 MG (21) TBPK tablet Take as directed 11/17/18   Asencion Islam, DPM    Allergies    Patient has no known allergies.  Review of  Systems   Review of Systems  HENT:  Positive for facial swelling.   Skin:  Positive for rash.  All other systems reviewed and are negative.  Physical Exam Updated Vital Signs BP 140/89 (BP Location: Right Arm)   Pulse 77   Temp 98.2 F (36.8 C) (Oral)   Resp 18   Ht 5\' 8"  (1.727 m)   Wt 106.1 kg   SpO2 100%   BMI 35.58 kg/m   Physical Exam Vitals and nursing note reviewed.  Constitutional:      Appearance: She is well-developed.  HENT:     Head: Normocephalic.     Comments: 2cm area of erythema right cheek,  crusted blister like areas     Right Ear: Tympanic membrane normal.     Mouth/Throat:     Mouth: Mucous membranes are moist.     Comments: Blister right mouth  Eyes:     Extraocular Movements: Extraocular movements intact.     Pupils: Pupils are equal, round, and reactive to light.  Cardiovascular:     Rate and Rhythm: Normal rate.  Pulmonary:     Effort: Pulmonary effort is normal.  Abdominal:     General: Abdomen is flat. There is no distension.  Musculoskeletal:        General: Normal range of motion.     Cervical back: Normal range of motion.  Neurological:     Mental Status: She is alert and oriented to person, place, and time.  Psychiatric:        Mood and Affect: Mood normal.    ED Results / Procedures / Treatments   Labs (all labs ordered are listed, but only abnormal results are displayed) Labs Reviewed - No data to display  EKG None  Radiology CT Maxillofacial W Contrast  Result Date: 08/11/2020 CLINICAL DATA:  Maxillary/facial abscess. Additional history provided: Redness, swelling, pain under right eye. Pain in jaw, right ear, sore throat. EXAM: CT MAXILLOFACIAL WITH CONTRAST TECHNIQUE: Multidetector CT imaging of the maxillofacial structures was performed with intravenous contrast. Multiplanar CT image reconstructions were also generated. CONTRAST:  52mL OMNIPAQUE IOHEXOL 300 MG/ML  SOLN COMPARISON:  No pertinent prior exams available for  comparison. FINDINGS: Osseous: No maxillofacial fracture or aggressive osseous lesion. No dental periapical lucencies are identified. Orbits: Nonspecific symmetric posterior globe calcifications. Swelling and induration of the right periorbital soft tissues (for instance as seen on series 2, image 52). Findings are suggestive of periorbital cellulitis. No acute finding within the orbits. The globes are normal in size and contour. The extraocular muscles and optic nerve sheath  complexes are symmetric and unremarkable. No evidence of postseptal orbital extension of infection. Sinuses: No significant paranasal sinus disease. Soft tissues: Induration and swelling of the right periorbital soft tissues as described above. No abscess is identified. Streak and beam hardening artifact arising from dental restoration partially obscures the oral cavity and oropharynx. No appreciable swelling or discrete mass within the oral cavity or visualized pharynx. No appreciable peritonsillar abscess. No retropharyngeal fluid collection at the imaged levels. Limited intracranial: No evidence of acute intracranial abnormality within the field of view. IMPRESSION: Swelling and induration of the right periorbital soft tissues, nonspecific, but suggestive of periorbital cellulitis. No CT evidence of postseptal orbital extension of infection. No abscess is identified. Nonspecific symmetric posterior globe calcifications. Artifact arising from dental restoration partially obscures the oral cavity and oropharynx. Within this limitation, there is no appreciable swelling or discrete mass within the oral cavity or visualized pharynx. No retropharyngeal collection at the imaged levels. Electronically Signed   By: Jackey Loge DO   On: 08/11/2020 14:54    Procedures Procedures   Medications Ordered in ED Medications  fluorescein ophthalmic strip 1 strip (1 strip Right Eye Given 08/11/20 1342)  iohexol (OMNIPAQUE) 300 MG/ML solution 100 mL (75  mLs Intravenous Contrast Given 08/11/20 1423)    ED Course  I have reviewed the triage vital signs and the nursing notes.  Pertinent labs & imaging results that were available during my care of the patient were reviewed by me and considered in my medical decision making (see chart for details).  Clinical Course as of 08/11/20 1855  Mon Aug 11, 2020  1316 She is here with a complaint of worsening rash on her right cheek.  She said it started as a small pustule which she tried to squeeze and has been getting worse.  She has been on antibiotics for a few days without any improvement.  We will check CT face.  Other possibility is this is shingles. [MB]    Clinical Course User Index [MB] Terrilee Files, MD   MDM Rules/Calculators/A&P                         Ct scan no acute abnormality,  I am suspicious that this is shinges.  I will add doxycycline and valtrex to pt's treatment.  Pt advised to recheck with her MD in 2 days  Final Clinical Impression(s) / ED Diagnoses Final diagnoses:  Facial rash  Facial cellulitis    Rx / DC Orders ED Discharge Orders          Ordered    valACYclovir (VALTREX) 1000 MG tablet  3 times daily        08/11/20 1530    promethazine (PHENERGAN) 25 MG tablet  Every 6 hours PRN        08/11/20 1530    doxycycline (VIBRAMYCIN) 100 MG capsule  2 times daily        08/11/20 1530          An After Visit Summary was printed and given to the patient.    Osie Cheeks 08/11/20 1858    Terrilee Files, MD 08/11/20 Mikle Bosworth

## 2020-08-11 NOTE — ED Triage Notes (Signed)
Pt was seen here on Saturday for facial swelling and redness, d/c with abx. Swelling has worsened. Now having pain above eye, under jaw and in right ear. States some blurred vision and trouble swallowing.

## 2020-08-11 NOTE — ED Notes (Signed)
Pt does report some pain behind eye, rt. Cheek & rt lateral neck. Spouse at bedside

## 2020-08-11 NOTE — ED Notes (Signed)
This past Tuesday noted redness and slight swelling under Rt eye. Noted a "bump" like area and pressed on area, sm amt of blood and fluid came out. Friday PM presented to [facility] ED and rec Rx. No improvement. Now has pain in Rt jaw, to rt ear and around rt eye. Rt eye appears nml, with nml pupil reaction, states she has a sore throat as well, no changes in speech and tracheal sounds are clear. Denies any fevers.

## 2020-08-11 NOTE — Discharge Instructions (Addendum)
Return if any problems. See your Physician for recheck in 2 days °

## 2020-09-16 ENCOUNTER — Ambulatory Visit (INDEPENDENT_AMBULATORY_CARE_PROVIDER_SITE_OTHER): Payer: No Typology Code available for payment source | Admitting: Family Medicine

## 2020-09-16 ENCOUNTER — Encounter: Payer: Self-pay | Admitting: Family Medicine

## 2020-09-16 ENCOUNTER — Other Ambulatory Visit: Payer: Self-pay

## 2020-09-16 ENCOUNTER — Ambulatory Visit: Payer: No Typology Code available for payment source | Admitting: Family Medicine

## 2020-09-16 VITALS — BP 136/82 | HR 84 | Temp 98.4°F | Ht 68.0 in | Wt 246.0 lb

## 2020-09-16 DIAGNOSIS — J189 Pneumonia, unspecified organism: Secondary | ICD-10-CM

## 2020-09-16 MED ORDER — AZITHROMYCIN 250 MG PO TABS: ORAL_TABLET | ORAL | 0 refills | Status: DC

## 2020-09-16 MED ORDER — PROMETHAZINE-DM 6.25-15 MG/5ML PO SYRP: 5.0000 mL | ORAL_SOLUTION | Freq: Four times a day (QID) | ORAL | 0 refills | Status: DC | PRN

## 2020-09-16 MED ORDER — METHYLPREDNISOLONE ACETATE 80 MG/ML IJ SUSP
80.0000 mg | Freq: Once | INTRAMUSCULAR | Status: AC
  Administered 2020-09-16: 80 mg via INTRAMUSCULAR

## 2020-09-16 NOTE — Patient Instructions (Addendum)
Continue to push fluids, practice good hand hygiene, and cover your mouth if you cough.  If you start having fevers, shaking or shortness of breath, seek immediate care.  Let us know if you need anything.  

## 2020-09-16 NOTE — Progress Notes (Signed)
6 

## 2020-09-16 NOTE — Progress Notes (Signed)
Chief Complaint  Patient presents with   Cough    Cough since 08/25/20    Abigail Wright here for URI complaints.  Duration: 3 weeks Associated symptoms: sinus congestion, rhinorrhea, wheezing, and coughing Denies: sinus pain, itchy watery eyes, ear pain, ear drainage, sore throat, N/V/D, shortness of breath, myalgia, and fevers Treatment to date: Cipro, Tessalon Perles, Robitussin Sick contacts: Yes Tested neg for covid.  Past Medical History:  Diagnosis Date   ADD (attention deficit disorder)    Anxiety    Blood transfusion without reported diagnosis    1993   Depression    GERD (gastroesophageal reflux disease)    in past but recently controlled with diet and exercise.   Hyperlipidemia     Objective BP 136/82   Pulse 84   Temp 98.4 F (36.9 C) (Oral)   Ht 5\' 8"  (1.727 m)   Wt 246 lb (111.6 kg)   SpO2 98%   BMI 37.40 kg/m  General: Awake, alert, appears stated age HEENT: AT, [place], ears patent b/l and TM's neg, nares patent w/o discharge, pharynx pink and without exudates, MMM Neck: No masses or asymmetry Heart: RRR Lungs: CTAB, no accessory muscle use Psych: Age appropriate judgment and insight, normal mood and affect  Walking pneumonia - Plan: promethazine-dextromethorphan (PROMETHAZINE-DM) 6.25-15 MG/5ML syrup, azithromycin (ZITHROMAX) 250 MG tablet, methylPREDNISolone acetate (DEPO-MEDROL) injection 80 mg  Depo injection today.  We will treat with amoxicillin to cover for walking pneumonia/atypicals given the duration.  Syrup as needed, warned about drowsiness. Continue to push fluids, practice good hand hygiene, cover mouth when coughing. F/u prn. If starting to experience fevers, shaking, or shortness of breath, seek immediate care. Pt voiced understanding and agreement to the plan.  04-26-1990 Murphy, DO 09/16/20 12:05 PM

## 2020-09-22 ENCOUNTER — Telehealth (INDEPENDENT_AMBULATORY_CARE_PROVIDER_SITE_OTHER): Payer: Self-pay | Admitting: Adult Health

## 2020-09-22 ENCOUNTER — Encounter: Payer: Self-pay | Admitting: Adult Health

## 2020-09-22 DIAGNOSIS — F411 Generalized anxiety disorder: Secondary | ICD-10-CM

## 2020-09-22 DIAGNOSIS — F909 Attention-deficit hyperactivity disorder, unspecified type: Secondary | ICD-10-CM

## 2020-09-22 DIAGNOSIS — F331 Major depressive disorder, recurrent, moderate: Secondary | ICD-10-CM

## 2020-09-22 MED ORDER — AMPHETAMINE-DEXTROAMPHETAMINE 30 MG PO TABS: 30.0000 mg | ORAL_TABLET | Freq: Two times a day (BID) | ORAL | 0 refills | Status: DC

## 2020-09-22 MED ORDER — CLONAZEPAM 0.5 MG PO TABS: 0.5000 mg | ORAL_TABLET | Freq: Every day | ORAL | 2 refills | Status: DC

## 2020-09-22 NOTE — Progress Notes (Signed)
Abigail Wright [id number] 04/26/1965 55 y.o.  Virtual Visit via Video Note  I connected with pt @ on 09/22/20 at 12:00 PM EDT by a video enabled telemedicine application and verified that I am speaking with the correct person using two identifiers.   I discussed the limitations of evaluation and management by telemedicine and the availability of in person appointments. The patient expressed understanding and agreed to proceed.  I discussed the assessment and treatment plan with the patient. The patient was provided an opportunity to ask questions and all were answered. The patient agreed with the plan and demonstrated an understanding of the instructions.   The patient was advised to call back or seek an in-person evaluation if the symptoms worsen or if the condition fails to improve as anticipated.  I provided 15 minutes of non-face-to-face time during this encounter.  The patient was located at home.  The provider was located at [facility].   Dorothyann Gibbs, NP   Subjective:   Patient ID:  Abigail Wright is a 55 y.o. (DOB 11-May-1965) female.  Chief Complaint: No chief complaint on file.   HPI [name] presents for follow-up of anxiety, ADHD, and depression.  Describes mood today as "ok". Pleasant. Denies tearfulness. Mood symptoms - denies depression, anxiety, and irritability. Stating "I'm doing alright". Recovering from pneumonia. Has been without air conditioning and using window units. Keeping granddaughter during the week. Feels like medications continue to work well for her. Upcoming physical with labs. Stable interest and motivation. Taking medications as prescribed.  Energy levels getting better. Active, does not have a regular exercise routine, but "runs around chasing granddaughter". Enjoys some usual interests and activities. Married. Lives with husband of 33 years. Spending time with family - 2 daughters - grandchild. Appetite adequate - eating better.  Weight stable.  Sleeps well most nights. Averages 7 to 8  hours.  Focus and concentration stable. Completing tasks. Managing aspects of household. 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   Denies SI or HI.  Denies AH or VH    Review of Systems:  Review of Systems  Musculoskeletal:  Negative for gait problem.  Neurological:  Negative for tremors.  Psychiatric/Behavioral:         Please refer to HPI   Medications: I have reviewed the patient's current medications.  Current Outpatient Medications  Medication Sig Dispense Refill   amphetamine-dextroamphetamine (ADDERALL) 30 MG tablet Take 1 tablet by mouth 2 (two) times daily. 60 tablet 0   [START ON 10/20/2020] amphetamine-dextroamphetamine (ADDERALL) 30 MG tablet Take 1 tablet by mouth 2 (two) times daily. 60 tablet 0   [START ON 11/17/2020] amphetamine-dextroamphetamine (ADDERALL) 30 MG tablet Take 1 tablet by mouth 2 (two) times daily. 60 tablet 0   azithromycin (ZITHROMAX) 250 MG tablet Take 2 tabs the first day and then 1 tab daily until you run out. 6 tablet 0   buPROPion (WELLBUTRIN XL) 300 MG 24 hr tablet TAKE 1 TABLET BY MOUTH EACH DAY 90 tablet 3   citalopram (CELEXA) 20 MG tablet TAKE 1 TABLET BY MOUTH EACH DAY 90 tablet 3   clonazePAM (KLONOPIN) 0.5 MG tablet Take 1 tablet (0.5 mg total) by mouth at bedtime. 90 tablet 2   doxycycline (VIBRAMYCIN) 100 MG capsule Take 1 capsule (100 mg total) by mouth 2 (two) times daily. 20 capsule 0   fluticasone (FLONASE) 50 MCG/ACT nasal spray Place into the nose.     glucosamine-chondroitin 500-400 MG tablet Take by mouth.  meloxicam (MOBIC) 15 MG tablet Take 1 tablet (15 mg total) by mouth daily. 30 tablet 0   Multiple Vitamins-Minerals (MULTIVITAMIN ADULT EXTRA C PO) Take by mouth.     predniSONE (STERAPRED UNI-PAK 21 TAB) 10 MG (21) TBPK tablet Take as directed 21 tablet 0   promethazine-dextromethorphan (PROMETHAZINE-DM) 6.25-15 MG/5ML syrup Take 5 mLs by mouth 4 (four) times daily as  needed for cough. 118 mL 0   No current facility-administered medications for this visit.    Medication Side Effects: None  Allergies: No Known Allergies  Past Medical History:  Diagnosis Date   ADD (attention deficit disorder)    Anxiety    Blood transfusion without reported diagnosis    1993   Depression    GERD (gastroesophageal reflux disease)    in past but recently controlled with diet and exercise.   Hyperlipidemia     Family History  Problem Relation Age of Onset   Cancer Mother    Diabetes Father    Heart disease Paternal Uncle    Hypertension Paternal Uncle    Colon cancer Neg Hx    Esophageal cancer Neg Hx    Rectal cancer Neg Hx    Stomach cancer Neg Hx     Social History   Socioeconomic History   Marital status: Married    Spouse name: Not on file   Number of children: Not on file   Years of education: Not on file   Highest education level: Not on file  Occupational History   Not on file  Tobacco Use   Smoking status: Former   Smokeless tobacco: Never  Vaping Use   Vaping Use: Some days  Substance and Sexual Activity   Alcohol use: Not Currently   Drug use: No   Sexual activity: Yes    Partners: Male    Birth control/protection: None, Post-menopausal  Other Topics Concern   Not on file  Social History Narrative   Not on file   Social Determinants of Health   Financial Resource Strain: Not on file  Food Insecurity: Not on file  Transportation Needs: Not on file  Physical Activity: Not on file  Stress: Not on file  Social Connections: Not on file  Intimate Partner Violence: Not on file    Past Medical History, Surgical history, Social history, and Family history were reviewed and updated as appropriate.   Please see review of systems for further details on the patient's review from today.   Objective:   Physical Exam:  There were no vitals taken for this visit.  Physical Exam Constitutional:      General: She is not in acute  distress. Musculoskeletal:        General: No deformity.  Neurological:     Mental Status: She is alert and oriented to person, place, and time.     Coordination: Coordination normal.  Psychiatric:        Attention and Perception: Attention and perception normal. She does not perceive auditory or visual hallucinations.        Mood and Affect: Mood normal. Mood is not anxious or depressed. Affect is not labile, blunt, angry or inappropriate.        Speech: Speech normal.        Behavior: Behavior normal.        Thought Content: Thought content normal. Thought content is not paranoid or delusional. Thought content does not include homicidal or suicidal ideation. Thought content does not include homicidal or suicidal plan.  Cognition and Memory: Cognition and memory normal.        Judgment: Judgment normal.     Comments: Insight intact    Lab Review:     Component Value Date/Time   NA 140 07/26/2017 0903   K 4.7 07/26/2017 0903   CL 102 07/26/2017 0903   CO2 31 07/26/2017 0903   GLUCOSE 95 07/26/2017 0903   BUN 13 07/26/2017 0903   CREATININE 0.74 07/26/2017 0903   CALCIUM 9.6 07/26/2017 0903   PROT 6.5 07/26/2017 0903   ALBUMIN 4.3 07/26/2017 0903   AST 18 07/26/2017 0903   ALT 19 07/26/2017 0903   ALKPHOS 52 07/26/2017 0903   BILITOT 0.6 07/26/2017 0903       Component Value Date/Time   WBC 5.5 07/26/2017 0903   RBC 4.92 07/26/2017 0903   HGB 14.5 07/26/2017 0903   HCT 43.4 07/26/2017 0903   PLT 292.0 07/26/2017 0903   MCV 88.3 07/26/2017 0903   MCHC 33.4 07/26/2017 0903   RDW 13.8 07/26/2017 0903    No results found for: POCLITH, LITHIUM   No results found for: PHENYTOIN, PHENOBARB, VALPROATE, CBMZ   .res Assessment: Plan:    Plan:  1. Adderall 30mg  BID 2. Celexa 20 mg daily 3. Wellbutrin XL 300mg  4. Clonazepam 0.5mg  daily   Continue to monitor BP - 136/82 - 84  RTC 3 months  Patient advised to contact office with any questions, adverse effects,  or acute worsening in signs and symptoms.  Discussed potential benefits, risk, and side effects of benzodiazepines to include potential risk of tolerance and dependence, as well as possible drowsiness.  Advised patient not to drive if experiencing drowsiness and to take lowest possible effective dose to minimize risk of dependence and tolerance.  Discussed potential benefits, risks, and side effects of stimulants with patient to include increased heart rate, palpitations, insomnia, increased anxiety, increased irritability, or decreased appetite.  Instructed patient to contact office if experiencing any significant tolerability issues.    Diagnoses and all orders for this visit:  Attention deficit hyperactivity disorder (ADHD), unspecified ADHD type  Major depressive disorder, recurrent episode, moderate (HCC) -     amphetamine-dextroamphetamine (ADDERALL) 30 MG tablet; Take 1 tablet by mouth 2 (two) times daily. -     amphetamine-dextroamphetamine (ADDERALL) 30 MG tablet; Take 1 tablet by mouth 2 (two) times daily. -     amphetamine-dextroamphetamine (ADDERALL) 30 MG tablet; Take 1 tablet by mouth 2 (two) times daily.  Generalized anxiety disorder -     clonazePAM (KLONOPIN) 0.5 MG tablet; Take 1 tablet (0.5 mg total) by mouth at bedtime.    Please see After Visit Summary for patient specific instructions.  No future appointments.   No orders of the defined types were placed in this encounter.     -------------------------------

## 2020-09-30 ENCOUNTER — Ambulatory Visit: Payer: No Typology Code available for payment source | Admitting: Physician Assistant

## 2020-10-01 ENCOUNTER — Telehealth: Payer: PRIVATE HEALTH INSURANCE | Admitting: Adult Health

## 2021-02-05 ENCOUNTER — Other Ambulatory Visit: Payer: Self-pay | Admitting: Adult Health

## 2021-02-05 DIAGNOSIS — F331 Major depressive disorder, recurrent, moderate: Secondary | ICD-10-CM

## 2021-02-05 NOTE — Telephone Encounter (Signed)
Last filled 11/8 appt on 02/24/21

## 2021-02-05 NOTE — Telephone Encounter (Signed)
Pt called wanting refill of Adderall to   [facility] - [place], [place] - [address]  [address] [place], [place] [address]  Phone:  [phone]  Fax:  [phone]  She has confirmed availability.  Next appt 1/3

## 2021-02-11 ENCOUNTER — Encounter: Payer: Self-pay | Admitting: Family Medicine

## 2021-02-11 ENCOUNTER — Telehealth: Payer: Self-pay | Admitting: Medical

## 2021-02-11 ENCOUNTER — Ambulatory Visit (INDEPENDENT_AMBULATORY_CARE_PROVIDER_SITE_OTHER): Payer: No Typology Code available for payment source | Admitting: Family Medicine

## 2021-02-11 VITALS — BP 122/80 | HR 113 | Temp 98.7°F | Ht 68.0 in | Wt 244.0 lb

## 2021-02-11 DIAGNOSIS — R070 Pain in throat: Secondary | ICD-10-CM

## 2021-02-11 DIAGNOSIS — J069 Acute upper respiratory infection, unspecified: Secondary | ICD-10-CM | POA: Diagnosis not present

## 2021-02-11 MED ORDER — METHYLPREDNISOLONE ACETATE 80 MG/ML IJ SUSP
80.0000 mg | Freq: Once | INTRAMUSCULAR | Status: AC
  Administered 2021-02-11: 14:00:00 80 mg via INTRAMUSCULAR

## 2021-02-11 MED ORDER — AZITHROMYCIN 250 MG PO TABS: ORAL_TABLET | ORAL | 0 refills | Status: DC

## 2021-02-11 NOTE — Progress Notes (Signed)
Chief Complaint  Patient presents with   throat congestion   Cough    [name] here for URI complaints.  Duration: 4 days  Associated symptoms: sinus congestion, rhinorrhea, sore throat, and coughing Denies: sinus pain, itchy watery eyes, ear fullness, ear pain, ear drainage, wheezing, shortness of breath, and fevers, N/V/D, loss of taste/smell Treatment to date: Dayquil, Tylenol Sick contacts: Yes; daughter Tested neg for covid.   Past Medical History:  Diagnosis Date   ADD (attention deficit disorder)    Anxiety    Blood transfusion without reported diagnosis    1993   Depression    GERD (gastroesophageal reflux disease)    in past but recently controlled with diet and exercise.   Hyperlipidemia     Objective BP 122/80    Pulse (!) 113    Temp 98.7 F (37.1 C) (Oral)    Ht 5\' 8"  (1.727 m)    Wt 244 lb (110.7 kg)    SpO2 99%    BMI 37.10 kg/m  General: Awake, alert, appears stated age HEENT: AT, [place], ears patent b/l and TM's neg, nares patent w/o discharge, pharynx pink and without exudates, MMM Neck: No masses or asymmetry Heart: RRR (HR around 84) Lungs: CTAB, no accessory muscle use Psych: Age appropriate judgment and insight, normal mood and affect  Upper respiratory tract infection, unspecified type  Throat pain  Depo injection today. Air humidifier. Mucinex. Pocket rx for Zpak, though likeliy won't need..  Continue to push fluids, practice good hand hygiene, cover mouth when coughing. F/u prn. If starting to experience fevers, shaking, or shortness of breath, seek immediate care. Pt voiced understanding and agreement to the plan.  [place], DO 02/11/21 1:45 PM

## 2021-02-11 NOTE — Telephone Encounter (Signed)
Pt requesting to [facility] from [facility] to Dr Carmelia Roller if ok with providers, pt states is Abigail Wright is her daughter and that she sees Dr Carmelia Roller as her provider and wanted to change to same daughter's provider. Please advise. Pt tel [phone].

## 2021-02-11 NOTE — Patient Instructions (Addendum)
Continue to push fluids, practice good hand hygiene, and cover your mouth if you cough.  If you start having fevers, shaking or shortness of breath, seek immediate care.  OK to take Tylenol 1000 mg (2 extra strength tabs) or 975 mg (3 regular strength tabs) every 6 hours as needed.  Use an air humidifier. Consider Mucinex also.   Let us know if you need anything.

## 2021-02-11 NOTE — Addendum Note (Signed)
Addended by: Scharlene Gloss B on: 02/11/2021 01:53 PM   Modules accepted: Orders

## 2021-02-11 NOTE — Telephone Encounter (Signed)
OK w me.  

## 2021-02-16 ENCOUNTER — Emergency Department (HOSPITAL_BASED_OUTPATIENT_CLINIC_OR_DEPARTMENT_OTHER)
Admission: EM | Admit: 2021-02-16 | Discharge: 2021-02-16 | Disposition: A | Discharge status: Home / Self Care | Payer: No Typology Code available for payment source | Attending: Emergency Medicine | Admitting: Emergency Medicine

## 2021-02-16 ENCOUNTER — Emergency Department (HOSPITAL_BASED_OUTPATIENT_CLINIC_OR_DEPARTMENT_OTHER): Payer: No Typology Code available for payment source

## 2021-02-16 ENCOUNTER — Encounter (HOSPITAL_BASED_OUTPATIENT_CLINIC_OR_DEPARTMENT_OTHER): Payer: Self-pay

## 2021-02-16 ENCOUNTER — Other Ambulatory Visit: Payer: Self-pay

## 2021-02-16 DIAGNOSIS — M25511 Pain in right shoulder: Secondary | ICD-10-CM | POA: Diagnosis not present

## 2021-02-16 DIAGNOSIS — W1839XA Other fall on same level, initial encounter: Secondary | ICD-10-CM | POA: Diagnosis not present

## 2021-02-16 DIAGNOSIS — M25562 Pain in left knee: Secondary | ICD-10-CM | POA: Diagnosis not present

## 2021-02-16 DIAGNOSIS — Z87891 Personal history of nicotine dependence: Secondary | ICD-10-CM | POA: Diagnosis not present

## 2021-02-16 DIAGNOSIS — W19XXXA Unspecified fall, initial encounter: Secondary | ICD-10-CM

## 2021-02-16 NOTE — ED Notes (Signed)
Patient transported to X-ray 

## 2021-02-16 NOTE — ED Provider Notes (Signed)
[facility] EMERGENCY DEPARTMENT Provider Note   CSN: [id number] Arrival date & time: 02/16/21  1154     History Chief Complaint  Patient presents with   Fall    Abigail Wright is a 55 y.o. female.  Patient states that she fell about 2 to 3 days ago landing on her right shoulder and left knee.  She states she had persistent pain there.  She was concerned that she may have fractured something.  Otherwise denies any headache or neck pain or back pain denies loss of consciousness.  No reports of chest pain or abdominal pain.  Patient continues to ambulate after the fall.      Past Medical History:  Diagnosis Date   ADD (attention deficit disorder)    Anxiety    Blood transfusion without reported diagnosis    1993   Depression    GERD (gastroesophageal reflux disease)    in past but recently controlled with diet and exercise.   Hyperlipidemia     Patient Active Problem List   Diagnosis Date Noted   Pain in left knee 11/04/2017   Benign paroxysmal positional vertigo of left ear 05/15/2015   Family history of breast cancer in mother 08/02/2011    Past Surgical History:  Procedure Laterality Date   ABDOMINAL HYSTERECTOMY     BUNIONECTOMY Left    CARPAL TUNNEL RELEASE Bilateral    cyst removal from left hand Left    FOOT SURGERY     KNEE CARTILAGE SURGERY     planters fascitis     TENDON REPAIR Left 03/21/2017   Left hand    TONSILLECTOMY       OB History   No obstetric history on file.     Family History  Problem Relation Age of Onset   Cancer Mother    Diabetes Father    Heart disease Paternal Uncle    Hypertension Paternal Uncle    Colon cancer Neg Hx    Esophageal cancer Neg Hx    Rectal cancer Neg Hx    Stomach cancer Neg Hx     Social History   Tobacco Use   Smoking status: Former   Smokeless tobacco: Never  Building services engineer Use: Some days  Substance Use Topics   Alcohol use: Not Currently   Drug use: No    Home  Medications Prior to Admission medications   Medication Sig Start Date End Date Taking? Authorizing Provider  amphetamine-dextroamphetamine (ADDERALL) 30 MG tablet Take 1 tablet by mouth 2 (two) times daily. 09/22/20   Mozingo, Thereasa Solo, NP  amphetamine-dextroamphetamine (ADDERALL) 30 MG tablet Take 1 tablet by mouth 2 (two) times daily. 10/20/20   Mozingo, Thereasa Solo, NP  amphetamine-dextroamphetamine (ADDERALL) 30 MG tablet TAKE 1 TABLET BY MOUTH TWICE DAILY 02/05/21   Mozingo, Thereasa Solo, NP  azithromycin (ZITHROMAX) 250 MG tablet Take 2 tabs the first day and then 1 tab daily until you run out. 02/14/21   Sharlene Dory, DO  buPROPion (WELLBUTRIN XL) 300 MG 24 hr tablet TAKE 1 TABLET BY MOUTH EACH DAY 07/04/20   Mozingo, Thereasa Solo, NP  citalopram (CELEXA) 20 MG tablet TAKE 1 TABLET BY MOUTH EACH DAY 07/04/20   Mozingo, Thereasa Solo, NP  clonazePAM (KLONOPIN) 0.5 MG tablet Take 1 tablet (0.5 mg total) by mouth at bedtime. 09/22/20   Mozingo, Thereasa Solo, NP  fluticasone (FLONASE) 50 MCG/ACT nasal spray Place into the nose.    [provider]  glucosamine-chondroitin 500-400  MG tablet Take by mouth.    [provider]  Multiple Vitamins-Minerals (MULTIVITAMIN ADULT EXTRA C PO) Take by mouth.    [provider]    Allergies    Patient has no known allergies.  Review of Systems   Review of Systems  Constitutional:  Negative for fever.  HENT:  Negative for ear pain.   Eyes:  Negative for pain.  Respiratory:  Negative for cough.   Cardiovascular:  Negative for chest pain.  Gastrointestinal:  Negative for abdominal pain.  Genitourinary:  Negative for flank pain.  Musculoskeletal:  Negative for back pain.  Skin:  Negative for rash.  Neurological:  Negative for headaches.   Physical Exam Updated Vital Signs BP (!) 131/94 (BP Location: Left Arm)    Pulse 99    Temp 98.4 F (36.9 C) (Oral)    Resp 15    Ht 5\' 8"  (1.727 m)    Wt  108.9 kg    SpO2 94%    BMI 36.49 kg/m   Physical Exam Constitutional:      General: She is not in acute distress.    Appearance: Normal appearance.  HENT:     Head: Normocephalic.     Nose: Nose normal.  Eyes:     Extraocular Movements: Extraocular movements intact.  Cardiovascular:     Rate and Rhythm: Normal rate.  Pulmonary:     Effort: Pulmonary effort is normal.  Musculoskeletal:        General: Normal range of motion.     Cervical back: Normal range of motion.     Comments: Mild tenderness palpation of the right shoulder and left knee.  No gross deformity on visual section.  Range of motion otherwise normal.  No effusion noted no erythema noted.  Neurological:     General: No focal deficit present.     Mental Status: She is alert. Mental status is at baseline.    ED Results / Procedures / Treatments   Labs (all labs ordered are listed, but only abnormal results are displayed) Labs Reviewed - No data to display  EKG None  Radiology DG Shoulder Right  Result Date: 02/16/2021 CLINICAL DATA:  Fall, pain EXAM: RIGHT SHOULDER - 2+ VIEW COMPARISON:  None. FINDINGS: There is no evidence of fracture or dislocation. There is no evidence of arthropathy or other focal bone abnormality. Soft tissues are unremarkable. IMPRESSION: Negative. Electronically Signed   By: 02/18/2021 M.D.   On: 02/16/2021 12:46   DG Knee Complete 4 Views Left  Result Date: 02/16/2021 CLINICAL DATA:  Fall, knee pain EXAM: LEFT KNEE - COMPLETE 4+ VIEW COMPARISON:  None. FINDINGS: Joint space narrowing in the medial patellofemoral compartment with spurring. No joint effusion. No acute bony abnormality. Specifically, no fracture, subluxation, or dislocation. IMPRESSION: Mild degenerative changes.  No acute bony abnormality. Electronically Signed   By: 02/18/2021 M.D.   On: 02/16/2021 12:45    Procedures Procedures   Medications Ordered in ED Medications - No data to display  ED Course  I have  reviewed the triage vital signs and the nursing notes.  Pertinent labs & imaging results that were available during my care of the patient were reviewed by me and considered in my medical decision making (see chart for details).    MDM Rules/Calculators/A&P                         X-ray showed no acute fracture or dislocation  or foreign body.  Patient ambulatory on scene.  Advised outpatient follow-up with her doctor within the week.  Advised immediate return for worsening symptoms fevers cough or any additional concerns.     Final Clinical Impression(s) / ED Diagnoses Final diagnoses:  Fall, initial encounter    Rx / DC Orders ED Discharge Orders     None        Cheryll Cockayne, MD 02/16/21 1310

## 2021-02-16 NOTE — Discharge Instructions (Signed)
Call your primary care doctor or specialist as discussed in the next 2-3 days.   Return immediately back to the ER if:  Your symptoms worsen within the next 12-24 hours. You develop new symptoms such as new fevers, persistent vomiting, new pain, shortness of breath, or new weakness or numbness, or if you have any other concerns.  

## 2021-02-16 NOTE — ED Triage Notes (Signed)
Pt arrives with c/o fall 2-3 days ago states that she landed on her left knee and right shoulder and continues to have pain to the area. Also states that she recently had a shot in her left knee.

## 2021-02-24 ENCOUNTER — Encounter: Payer: Self-pay | Admitting: Adult Health

## 2021-02-24 ENCOUNTER — Telehealth (INDEPENDENT_AMBULATORY_CARE_PROVIDER_SITE_OTHER): Payer: Self-pay | Admitting: Adult Health

## 2021-02-24 DIAGNOSIS — F331 Major depressive disorder, recurrent, moderate: Secondary | ICD-10-CM

## 2021-02-24 DIAGNOSIS — F411 Generalized anxiety disorder: Secondary | ICD-10-CM

## 2021-02-24 MED ORDER — AMPHETAMINE-DEXTROAMPHETAMINE 30 MG PO TABS: 30.0000 mg | ORAL_TABLET | Freq: Two times a day (BID) | ORAL | 0 refills | Status: DC

## 2021-02-24 MED ORDER — AMPHETAMINE-DEXTROAMPHETAMINE 30 MG PO TABS: 1.0000 | ORAL_TABLET | Freq: Two times a day (BID) | ORAL | 0 refills | Status: DC

## 2021-02-24 MED ORDER — CLONAZEPAM 0.5 MG PO TABS: 0.5000 mg | ORAL_TABLET | Freq: Every day | ORAL | 0 refills | Status: DC

## 2021-02-24 NOTE — Progress Notes (Signed)
Abigail Wright [id number] [birth year] 56 y.o.  Virtual Visit via VideoMarisa Cyphers Note  I connected with pt @ on 02/24/21 at  1:00 PM EST by a video enabled telemedicine application and verified that I am speaking with the correct person using two identifiers.   I discussed the limitations of evaluation and management by telemedicine and the availability of in person appointments. The patient expressed understanding and agreed to proceed.  I discussed the assessment and treatment plan with the patient. The patient was provided an opportunity to ask questions and all were answered. The patient agreed with the plan and demonstrated an understanding of the instructions.   The patient was advised to call back or seek an in-person evaluation if the symptoms worsen or if the condition fails to improve as anticipated.  I provided 15 minutes of non-face-to-face time during this encounter.  The patient was located at home.  [facility]   Dorothyann Gibbsegina N Zamere Pasternak, NP   Subjective:   Patient ID:  Abigail NightingaleRischard Georg is a 56 y.o. (DOB [birth year]) female.  Chief Complaint: No chief complaint on file.   HPI Abigail Wright presents for follow-up of anxiety, ADHD, and depression.  Describes mood today as "ok". Pleasant. Denies tearfulness. Mood symptoms - denies depression, anxiety, and irritability. Stating "I'm doing alright". Recovering from the "crud" - "knee bothering me". New granddaughter born in November. Keeping granddaughter 18 months during the week. She and husband doing well. Feels like medications continue to work well for her. Stable interest and motivation. Taking medications as prescribed.  Energy levels stable. Active, does not have a regular exercise routine.  Enjoys some usual interests and activities. Married. Lives with husband of 33 years. Spending time with family - 2 daughters - 2 granddaughters. Appetite adequate. Weight stable.  Sleeps well most nights. Averages  7 to 8  hours.  Focus and concentration stable. Completing tasks. Managing aspects of household. 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   Denies SI or HI.  Denies AH or VH.     Review of Systems:  Review of Systems  Musculoskeletal:  Negative for gait problem.  Neurological:  Negative for tremors.  Psychiatric/Behavioral:         Please refer to HPI   Medications: I have reviewed the patient's current medications.  Current Outpatient Medications  Medication Sig Dispense Refill   amphetamine-dextroamphetamine (ADDERALL) 30 MG tablet Take 1 tablet by mouth 2 (two) times daily. 60 tablet 0   [START ON 03/24/2021] amphetamine-dextroamphetamine (ADDERALL) 30 MG tablet Take 1 tablet by mouth 2 (two) times daily. 60 tablet 0   [START ON 04/21/2021] amphetamine-dextroamphetamine (ADDERALL) 30 MG tablet Take 1 tablet by mouth 2 (two) times daily. 60 tablet 0   azithromycin (ZITHROMAX) 250 MG tablet Take 2 tabs the first day and then 1 tab daily until you run out. 6 tablet 0   buPROPion (WELLBUTRIN XL) 300 MG 24 hr tablet TAKE 1 TABLET BY MOUTH EACH DAY 90 tablet 3   citalopram (CELEXA) 20 MG tablet TAKE 1 TABLET BY MOUTH EACH DAY 90 tablet 3   clonazePAM (KLONOPIN) 0.5 MG tablet Take 1 tablet (0.5 mg total) by mouth at bedtime. 90 tablet 0   fluticasone (FLONASE) 50 MCG/ACT nasal spray Place into the nose.     glucosamine-chondroitin 500-400 MG tablet Take by mouth.     Multiple Vitamins-Minerals (MULTIVITAMIN ADULT EXTRA C PO) Take by mouth.     No current facility-administered medications for this visit.  Medication Side Effects: None  Allergies: No Known Allergies  Past Medical History:  Diagnosis Date   ADD (attention deficit disorder)    Anxiety    Blood transfusion without reported diagnosis    1993   Depression    GERD (gastroesophageal reflux disease)    in past but recently controlled with diet and exercise.   Hyperlipidemia     Family History  Problem Relation Age of  Onset   Cancer Mother    Diabetes Father    Heart disease Paternal Uncle    Hypertension Paternal Uncle    Colon cancer Neg Hx    Esophageal cancer Neg Hx    Rectal cancer Neg Hx    Stomach cancer Neg Hx     Social History   Socioeconomic History   Marital status: Married    Spouse name: Not on file   Number of children: Not on file   Years of education: Not on file   Highest education level: Not on file  Occupational History   Not on file  Tobacco Use   Smoking status: Former   Smokeless tobacco: Never  Vaping Use   Vaping Use: Some days  Substance and Sexual Activity   Alcohol use: Not Currently   Drug use: No   Sexual activity: Yes    Partners: Male    Birth control/protection: None, Post-menopausal  Other Topics Concern   Not on file  Social History Narrative   Not on file   Social Determinants of Health   Financial Resource Strain: Not on file  Food Insecurity: Not on file  Transportation Needs: Not on file  Physical Activity: Not on file  Stress: Not on file  Social Connections: Not on file  Intimate Partner Violence: Not on file    Past Medical History, Surgical history, Social history, and Family history were reviewed and updated as appropriate.   Please see review of systems for further details on the patient's review from today.   Objective:   Physical Exam:  BP 130/90   Physical Exam Constitutional:      General: She is not in acute distress. Musculoskeletal:        General: No deformity.  Neurological:     Mental Status: She is alert and oriented to person, place, and time.     Coordination: Coordination normal.  Psychiatric:        Attention and Perception: Attention and perception normal. She does not perceive auditory or visual hallucinations.        Mood and Affect: Mood normal. Mood is not anxious or depressed. Affect is not labile, blunt, angry or inappropriate.        Speech: Speech normal.        Behavior: Behavior normal.         Thought Content: Thought content normal. Thought content is not paranoid or delusional. Thought content does not include homicidal or suicidal ideation. Thought content does not include homicidal or suicidal plan.        Cognition and Memory: Cognition and memory normal.        Judgment: Judgment normal.     Comments: Insight intact    Lab Review:     Component Value Date/Time   NA 140 07/26/2017 0903   K 4.7 07/26/2017 0903   CL 102 07/26/2017 0903   CO2 31 07/26/2017 0903   GLUCOSE 95 07/26/2017 0903   BUN 13 07/26/2017 0903   CREATININE 0.74 07/26/2017 0903   CALCIUM 9.6 07/26/2017  0903   PROT 6.5 07/26/2017 0903   ALBUMIN 4.3 07/26/2017 0903   AST 18 07/26/2017 0903   ALT 19 07/26/2017 0903   ALKPHOS 52 07/26/2017 0903   BILITOT 0.6 07/26/2017 0903       Component Value Date/Time   WBC 5.5 07/26/2017 0903   RBC 4.92 07/26/2017 0903   HGB 14.5 07/26/2017 0903   HCT 43.4 07/26/2017 0903   PLT 292.0 07/26/2017 0903   MCV 88.3 07/26/2017 0903   MCHC 33.4 07/26/2017 0903   RDW 13.8 07/26/2017 0903    No results found for: POCLITH, LITHIUM   No results found for: PHENYTOIN, PHENOBARB, VALPROATE, CBMZ   .res Assessment: Plan:    Plan:  1. Adderall 30mg  BID 2. Celexa 20 mg daily 3. Wellbutrin XL 300mg  4. Clonazepam 0.5mg  daily   Continue to monitor BP - 130/90 - 84  RTC 3 months  Patient advised to contact office with any questions, adverse effects, or acute worsening in signs and symptoms.  Discussed potential benefits, risk, and side effects of benzodiazepines to include potential risk of tolerance and dependence, as well as possible drowsiness.  Advised patient not to drive if experiencing drowsiness and to take lowest possible effective dose to minimize risk of dependence and tolerance.  Discussed potential benefits, risks, and side effects of stimulants with patient to include increased heart rate, palpitations, insomnia, increased anxiety, increased  irritability, or decreased appetite.  Instructed patient to contact office if experiencing any significant tolerability issues.  Diagnoses and all orders for this visit:  Major depressive disorder, recurrent episode, moderate (HCC) -     amphetamine-dextroamphetamine (ADDERALL) 30 MG tablet; Take 1 tablet by mouth 2 (two) times daily. -     amphetamine-dextroamphetamine (ADDERALL) 30 MG tablet; Take 1 tablet by mouth 2 (two) times daily. -     amphetamine-dextroamphetamine (ADDERALL) 30 MG tablet; Take 1 tablet by mouth 2 (two) times daily.  Generalized anxiety disorder -     clonazePAM (KLONOPIN) 0.5 MG tablet; Take 1 tablet (0.5 mg total) by mouth at bedtime.     Please see After Visit Summary for patient specific instructions.  Future Appointments  Date Time Provider Department Center  03/06/2021  1:30 PM , DO [id number]    No orders of the defined types were placed in this encounter.     -------------------------------

## 2021-03-06 ENCOUNTER — Encounter: Payer: Self-pay | Admitting: Family Medicine

## 2021-03-06 ENCOUNTER — Ambulatory Visit (INDEPENDENT_AMBULATORY_CARE_PROVIDER_SITE_OTHER): Payer: No Typology Code available for payment source | Admitting: Family Medicine

## 2021-03-06 VITALS — BP 134/78 | HR 97 | Temp 98.2°F | Resp 18 | Ht 68.0 in | Wt 241.8 lb

## 2021-03-06 DIAGNOSIS — Z23 Encounter for immunization: Secondary | ICD-10-CM

## 2021-03-06 DIAGNOSIS — F331 Major depressive disorder, recurrent, moderate: Secondary | ICD-10-CM | POA: Diagnosis not present

## 2021-03-06 DIAGNOSIS — Z0000 Encounter for general adult medical examination without abnormal findings: Secondary | ICD-10-CM | POA: Diagnosis not present

## 2021-03-06 NOTE — Patient Instructions (Addendum)
Give [place] 2-3 business days to get the results of your labs back.   Keep the diet clean and stay active.  Aim to do some physical exertion for 150 minutes per week. This is typically divided into 5 days per week, 30 minutes per day. The activity should be enough to get your heart rate up. Anything is better than nothing if you have time constraints.  Call Center for [facility] at [phone] for an appointment.  They are located at [address], [address], [place], [place], [address] (right across the hall from our office).  Please consider the covid vaccination.   Let us know if you need anything.

## 2021-03-06 NOTE — Addendum Note (Signed)
Addended by: CREFT, Darlis Loan on: 03/06/2021 02:12 PM   Modules accepted: Orders

## 2021-03-06 NOTE — Progress Notes (Signed)
Chief Complaint  Patient presents with   TOC- CPE    From Esperanza Richters, PA-C Concerns/ questions: none Does not get flu shots No recs in [facility]     Well Woman Abigail Wright is here for a complete physical.   Her last physical was >1 year ago.  Current diet: in general, diet could be better. Current exercise: active with grandchild, walking. Weight is stable and she denies fatigue out of ordinary. Seatbelt? Yes Advanced directive? No  Health Maintenance Pap/HPV- no longer needs Mammogram- Yes Colon cancer screening-Yes Shingrix- No Tetanus- Yes Hep C screening- Yes HIV screening- Yes  Past Medical History:  Diagnosis Date   ADD (attention deficit disorder)    Anxiety    Blood transfusion without reported diagnosis    1993   Depression    GERD (gastroesophageal reflux disease)    in past but recently controlled with diet and exercise.   Hyperlipidemia      Past Surgical History:  Procedure Laterality Date   ABDOMINAL HYSTERECTOMY     BUNIONECTOMY Left    CARPAL TUNNEL RELEASE Bilateral    cyst removal from left hand Left    FOOT SURGERY     KNEE CARTILAGE SURGERY     planters fascitis     TENDON REPAIR Left 03/21/2017   Left hand    TONSILLECTOMY      Medications  Current Outpatient Medications on File Prior to Visit  Medication Sig Dispense Refill   amphetamine-dextroamphetamine (ADDERALL) 30 MG tablet Take 1 tablet by mouth 2 (two) times daily. 60 tablet 0   [START ON 03/24/2021] amphetamine-dextroamphetamine (ADDERALL) 30 MG tablet Take 1 tablet by mouth 2 (two) times daily. 60 tablet 0   [START ON 04/21/2021] amphetamine-dextroamphetamine (ADDERALL) 30 MG tablet Take 1 tablet by mouth 2 (two) times daily. 60 tablet 0   citalopram (CELEXA) 20 MG tablet TAKE 1 TABLET BY MOUTH EACH DAY 90 tablet 3   clonazePAM (KLONOPIN) 0.5 MG tablet Take 1 tablet (0.5 mg total) by mouth at bedtime. 90 tablet 0   fluticasone (FLONASE) 50 MCG/ACT nasal spray Place into the  nose.     glucosamine-chondroitin 500-400 MG tablet Take by mouth.     Multiple Vitamins-Minerals (MULTIVITAMIN ADULT EXTRA C PO) Take by mouth.     Allergies No Known Allergies  Review of Systems: Constitutional:  no unexpected weight changes Eye:  no recent significant change in vision Ear/Nose/Mouth/Throat:  Ears:  no recent change in hearing Nose/Mouth/Throat:  no complaints of nasal congestion, no sore throat Cardiovascular: no chest pain Respiratory:  no shortness of breath Gastrointestinal:  no abdominal pain, no change in bowel habits GU:  Female: negative for dysuria or pelvic pain Musculoskeletal/Extremities:  no pain of the joints Integumentary (Skin/Breast):  no abnormal skin lesions reported Neurologic:  no headaches Endocrine:  denies fatigue  Exam BP 134/78 (BP Location: Left Arm, Patient Position: Sitting, Cuff Size: Large)    Pulse 97    Temp 98.2 F (36.8 C) (Oral)    Resp 18    Ht 5\' 8"  (1.727 m)    Wt 241 lb 12.8 oz (109.7 kg)    SpO2 98%    BMI 36.77 kg/m  General:  well developed, well nourished, in no apparent distress Skin:  no significant moles, warts, or growths Head:  no masses, lesions, or tenderness Eyes:  pupils equal and round, sclera anicteric without injection Ears:  canals without lesions, TMs shiny without retraction, no obvious effusion, no erythema Nose:  nares patent, septum midline, mucosa normal, and no drainage or sinus tenderness Throat/Pharynx:  lips and gingiva without lesion; tongue and uvula midline; non-inflamed pharynx; no exudates or postnasal drainage Neck: neck supple without adenopathy, thyromegaly, or masses Lungs:  clear to auscultation, breath sounds equal bilaterally, no respiratory distress Cardio:  regular rate and rhythm, no LE edema Abdomen:  abdomen soft, nontender; bowel sounds normal; no masses or organomegaly Genital: Defer to GYN Musculoskeletal:  symmetrical muscle groups noted without atrophy or  deformity Extremities:  no clubbing, cyanosis, or edema, no deformities, no skin discoloration Neuro:  gait normal; deep tendon reflexes normal and symmetric Psych: well oriented with normal range of affect and appropriate judgment/insight  Assessment and Plan  Well adult exam - Plan: CBC, Comprehensive metabolic panel, Lipid panel  Major depressive disorder, recurrent episode, moderate (HCC)   Well 56 y.o. female. Counseled on diet and exercise. Other orders as above. Shingrix today, politely declines flu shot.  Bivalent COVID vaccination booster recommended.  Advanced directive form provided today.  Follow up in 1 yr or prn. The patient voiced understanding and agreement to the plan.  Jilda Roche [place], DO 03/06/21 2:01 PM

## 2021-03-10 ENCOUNTER — Other Ambulatory Visit: Payer: Self-pay | Admitting: Family Medicine

## 2021-03-10 ENCOUNTER — Other Ambulatory Visit (INDEPENDENT_AMBULATORY_CARE_PROVIDER_SITE_OTHER): Payer: No Typology Code available for payment source

## 2021-03-10 DIAGNOSIS — Z0000 Encounter for general adult medical examination without abnormal findings: Secondary | ICD-10-CM | POA: Diagnosis not present

## 2021-03-10 DIAGNOSIS — E875 Hyperkalemia: Secondary | ICD-10-CM

## 2021-03-10 LAB — CBC
HCT: 45.6 % (ref 36.0–46.0)
Hemoglobin: 14.5 g/dL (ref 12.0–15.0)
MCHC: 31.8 g/dL (ref 30.0–36.0)
MCV: 86.5 fl (ref 78.0–100.0)
Platelets: 287 10*3/uL (ref 150.0–400.0)
RBC: 5.28 Mil/uL — ABNORMAL HIGH (ref 3.87–5.11)
RDW: 14.1 % (ref 11.5–15.5)
WBC: 5.7 10*3/uL (ref 4.0–10.5)

## 2021-03-10 LAB — LIPID PANEL
Cholesterol: 231 mg/dL — ABNORMAL HIGH (ref 0–200)
HDL: 82.1 mg/dL (ref >39.00)
LDL Cholesterol: 132 mg/dL — ABNORMAL HIGH (ref 0–99)
NonHDL: 148.77
Total CHOL/HDL Ratio: 3
Triglycerides: 84 mg/dL (ref 0.0–149.0)
VLDL: 16.8 mg/dL (ref 0.0–40.0)

## 2021-03-10 LAB — COMPREHENSIVE METABOLIC PANEL
ALT: 24 U/L (ref 0–35)
AST: 18 U/L (ref 0–37)
Albumin: 4.6 g/dL (ref 3.5–5.2)
Alkaline Phosphatase: 76 U/L (ref 39–117)
BUN: 18 mg/dL (ref 6–23)
CO2: 34 mEq/L — ABNORMAL HIGH (ref 19–32)
Calcium: 10.2 mg/dL (ref 8.4–10.5)
Chloride: 100 mEq/L (ref 96–112)
Creatinine, Ser: 0.88 mg/dL (ref 0.40–1.20)
GFR: 74.82 mL/min (ref >60.00)
Glucose, Bld: 101 mg/dL — ABNORMAL HIGH (ref 70–99)
Potassium: 5.3 mEq/L — ABNORMAL HIGH (ref 3.5–5.1)
Sodium: 140 mEq/L (ref 135–145)
Total Bilirubin: 0.7 mg/dL (ref 0.2–1.2)
Total Protein: 7.3 g/dL (ref 6.0–8.3)

## 2021-03-12 ENCOUNTER — Encounter: Payer: Self-pay | Admitting: Family Medicine

## 2021-03-13 ENCOUNTER — Other Ambulatory Visit (INDEPENDENT_AMBULATORY_CARE_PROVIDER_SITE_OTHER): Payer: No Typology Code available for payment source

## 2021-03-13 ENCOUNTER — Other Ambulatory Visit: Payer: Self-pay | Admitting: Family Medicine

## 2021-03-13 DIAGNOSIS — E875 Hyperkalemia: Secondary | ICD-10-CM | POA: Diagnosis not present

## 2021-03-13 DIAGNOSIS — M25511 Pain in right shoulder: Secondary | ICD-10-CM

## 2021-03-13 LAB — BASIC METABOLIC PANEL
BUN: 15 mg/dL (ref 6–23)
CO2: 31 mEq/L (ref 19–32)
Calcium: 9.8 mg/dL (ref 8.4–10.5)
Chloride: 101 mEq/L (ref 96–112)
Creatinine, Ser: 0.95 mg/dL (ref 0.40–1.20)
GFR: 68.25 mL/min (ref >60.00)
Glucose, Bld: 118 mg/dL — ABNORMAL HIGH (ref 70–99)
Potassium: 4.2 mEq/L (ref 3.5–5.1)
Sodium: 138 mEq/L (ref 135–145)

## 2021-04-08 ENCOUNTER — Other Ambulatory Visit: Payer: Self-pay | Admitting: Family Medicine

## 2021-04-08 DIAGNOSIS — M25511 Pain in right shoulder: Secondary | ICD-10-CM

## 2021-04-14 ENCOUNTER — Ambulatory Visit: Payer: No Typology Code available for payment source | Admitting: Orthopaedic Surgery

## 2021-04-16 ENCOUNTER — Ambulatory Visit: Payer: No Typology Code available for payment source | Admitting: Orthopaedic Surgery

## 2021-05-06 ENCOUNTER — Ambulatory Visit: Payer: No Typology Code available for payment source

## 2021-05-12 ENCOUNTER — Ambulatory Visit (INDEPENDENT_AMBULATORY_CARE_PROVIDER_SITE_OTHER): Payer: No Typology Code available for payment source | Admitting: *Deleted

## 2021-05-12 DIAGNOSIS — Z23 Encounter for immunization: Secondary | ICD-10-CM

## 2021-05-12 NOTE — Progress Notes (Signed)
Patient here for 2nd shingles vaccine.   Vaccine given in left deltoid and patient tolerated well. 

## 2021-06-02 ENCOUNTER — Encounter: Payer: Self-pay | Admitting: Adult Health

## 2021-06-02 ENCOUNTER — Telehealth (INDEPENDENT_AMBULATORY_CARE_PROVIDER_SITE_OTHER): Payer: Self-pay | Admitting: Adult Health

## 2021-06-02 VITALS — BP 128/80

## 2021-06-02 DIAGNOSIS — F331 Major depressive disorder, recurrent, moderate: Secondary | ICD-10-CM

## 2021-06-02 DIAGNOSIS — F411 Generalized anxiety disorder: Secondary | ICD-10-CM

## 2021-06-02 DIAGNOSIS — F909 Attention-deficit hyperactivity disorder, unspecified type: Secondary | ICD-10-CM

## 2021-06-02 MED ORDER — CITALOPRAM HYDROBROMIDE 20 MG PO TABS: ORAL_TABLET | ORAL | 3 refills | Status: DC

## 2021-06-02 MED ORDER — AMPHETAMINE-DEXTROAMPHETAMINE 30 MG PO TABS: 30.0000 mg | ORAL_TABLET | Freq: Two times a day (BID) | ORAL | 0 refills | Status: DC

## 2021-06-02 MED ORDER — CLONAZEPAM 0.5 MG PO TABS: 0.5000 mg | ORAL_TABLET | Freq: Every day | ORAL | 2 refills | Status: DC

## 2021-06-02 MED ORDER — AMPHETAMINE-DEXTROAMPHETAMINE 30 MG PO TABS: 1.0000 | ORAL_TABLET | Freq: Two times a day (BID) | ORAL | 0 refills | Status: DC

## 2021-06-02 NOTE — Progress Notes (Signed)
Abigail Wright ?[id number] ?1965-07-12 ?55 y.o. ? ?Virtual Visit via Video Note ? ?I connected with pt @ on 06/02/21 at 12:00 PM EDT by a video enabled telemedicine application and verified that I am speaking with the correct person using two identifiers. ?  ?I discussed the limitations of evaluation and management by telemedicine and the availability of in person appointments. The patient expressed understanding and agreed to proceed. ? ?I discussed the assessment and treatment plan with the patient. The patient was provided an opportunity to ask questions and all were answered. The patient agreed with the plan and demonstrated an understanding of the instructions. ?  ?The patient was advised to call back or seek an in-person evaluation if the symptoms worsen or if the condition fails to improve as anticipated. ? ?I provided 20 minutes of non-face-to-face time during this encounter.  The patient was located at home.  The provider was located at [place]. ? ? ?Aloha Gell, NP  ? ?Subjective:  ? ?Patient ID:  Abigail Wright is a 56 y.o. (DOB 01-21-1966) female. ? ?Chief Complaint: No chief complaint on file. ? ? ?HPI ?Abigail Wright presents for follow-up of anxiety, ADHD, and depression. ? ?Describes mood today as "ok". Pleasant. Denies tearfulness. Mood symptoms - denies depression, anxiety and irritability. Stating "I'm doing ok". Feels like medications are working well. She and husband doing well.  Stable interest and motivation. Taking medications as prescribed.  ?Energy levels stable. Active, does not have a regular exercise routine.  ?Enjoys some usual interests and activities. Married. Lives with husband of 63 years. Spending time with family - 2 daughters - 2 granddaughters. ?Appetite adequate. Weight stable.  ?Sleeps well most nights. Averages 7 to 8  hours.  ?Focus and concentration stable. Completing tasks. Managing aspects of household. 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   ?Denies SI or HI.   ?Denies AH or VH. ? ?Review of Systems:  ?Review of Systems  ?Musculoskeletal:  Negative for gait problem.  ?Neurological:  Negative for tremors.  ?Psychiatric/Behavioral:    ?     Please refer to HPI  ? ?Medications: I have reviewed the patient's current medications. ? ?Current Outpatient Medications  ?Medication Sig Dispense Refill  ? amphetamine-dextroamphetamine (ADDERALL) 30 MG tablet Take 1 tablet by mouth 2 (two) times daily. 60 tablet 0  ? [START ON 06/30/2021] amphetamine-dextroamphetamine (ADDERALL) 30 MG tablet Take 1 tablet by mouth 2 (two) times daily. 60 tablet 0  ? [START ON 07/28/2021] amphetamine-dextroamphetamine (ADDERALL) 30 MG tablet Take 1 tablet by mouth 2 (two) times daily. 60 tablet 0  ? citalopram (CELEXA) 20 MG tablet TAKE 1 TABLET BY MOUTH EACH DAY 90 tablet 3  ? clonazePAM (KLONOPIN) 0.5 MG tablet Take 1 tablet (0.5 mg total) by mouth at bedtime. 90 tablet 2  ? fluticasone (FLONASE) 50 MCG/ACT nasal spray Place into the nose.    ? glucosamine-chondroitin 500-400 MG tablet Take by mouth.    ? Multiple Vitamins-Minerals (MULTIVITAMIN ADULT EXTRA C PO) Take by mouth.    ? ?No current facility-administered medications for this visit.  ? ? ?Medication Side Effects: None ? ?Allergies: No Known Allergies ? ?Past Medical History:  ?Diagnosis Date  ? ADD (attention deficit disorder)   ? Anxiety   ? Blood transfusion without reported diagnosis   ? 1993  ? Depression   ? GERD (gastroesophageal reflux disease)   ? in past but recently controlled with diet and exercise.  ? Hyperlipidemia   ? ? ?Family History  ?  Problem Relation Age of Onset  ? Cancer Mother   ? Diabetes Father   ? Heart disease Paternal Uncle   ? Hypertension Paternal Uncle   ? Colon cancer Neg Hx   ? Esophageal cancer Neg Hx   ? Rectal cancer Neg Hx   ? Stomach cancer Neg Hx   ? ? ?Social History  ? ?Socioeconomic History  ? Marital status: Married  ?  Spouse name: Not on file  ? Number of children: Not on file  ? Years of education:  Not on file  ? Highest education level: Not on file  ?Occupational History  ? Not on file  ?Tobacco Use  ? Smoking status: Former  ? Smokeless tobacco: Never  ?Vaping Use  ? Vaping Use: Some days  ?Substance and Sexual Activity  ? Alcohol use: Not Currently  ? Drug use: No  ? Sexual activity: Yes  ?  Partners: Male  ?  Birth control/protection: None, Post-menopausal  ?Other Topics Concern  ? Not on file  ?Social History Narrative  ? Not on file  ? ?Social Determinants of Health  ? ?Financial Resource Strain: Not on file  ?Food Insecurity: Not on file  ?Transportation Needs: Not on file  ?Physical Activity: Not on file  ?Stress: Not on file  ?Social Connections: Not on file  ?Intimate Partner Violence: Not on file  ? ? ?Past Medical History, Surgical history, Social history, and Family history were reviewed and updated as appropriate.  ? ?Please see review of systems for further details on the patient's review from today.  ? ?Objective:  ? ?Physical Exam:  ?BP 128/80  ? ?Physical Exam ?Constitutional:   ?   General: She is not in acute distress. ?Musculoskeletal:     ?   General: No deformity.  ?Neurological:  ?   Mental Status: She is alert and oriented to person, place, and time.  ?   Coordination: Coordination normal.  ?Psychiatric:     ?   Attention and Perception: Attention and perception normal. She does not perceive auditory or visual hallucinations.     ?   Mood and Affect: Mood normal. Mood is not anxious or depressed. Affect is not labile, blunt, angry or inappropriate.     ?   Speech: Speech normal.     ?   Behavior: Behavior normal.     ?   Thought Content: Thought content normal. Thought content is not paranoid or delusional. Thought content does not include homicidal or suicidal ideation. Thought content does not include homicidal or suicidal plan.     ?   Cognition and Memory: Cognition and memory normal.     ?   Judgment: Judgment normal.  ?   Comments: Insight intact  ? ? ?Lab Review:  ?   ?Component  Value Date/Time  ? NA 138 03/13/2021 1110  ? K 4.2 03/13/2021 1110  ? CL 101 03/13/2021 1110  ? CO2 31 03/13/2021 1110  ? GLUCOSE 118 (H) 03/13/2021 1110  ? BUN 15 03/13/2021 1110  ? CREATININE 0.95 03/13/2021 1110  ? CALCIUM 9.8 03/13/2021 1110  ? PROT 7.3 03/10/2021 1039  ? ALBUMIN 4.6 03/10/2021 1039  ? AST 18 03/10/2021 1039  ? ALT 24 03/10/2021 1039  ? ALKPHOS 76 03/10/2021 1039  ? BILITOT 0.7 03/10/2021 1039  ? ? ?   ?Component Value Date/Time  ? WBC 5.7 03/10/2021 1039  ? RBC 5.28 (H) 03/10/2021 1039  ? HGB 14.5 03/10/2021 1039  ? HCT 45.6  03/10/2021 1039  ? PLT 287.0 03/10/2021 1039  ? MCV 86.5 03/10/2021 1039  ? MCHC 31.8 03/10/2021 1039  ? RDW 14.1 03/10/2021 1039  ? ? ?No results found for: POCLITH, LITHIUM  ? ?No results found for: PHENYTOIN, PHENOBARB, VALPROATE, CBMZ  ? ?.res ?Assessment: Plan:   ? ?Plan: ? ?1. Adderall 30mg  BID ?2. Celexa 20 mg daily ?3. Wellbutrin XL 300mg  ?4. Clonazepam 0.5mg  daily  ? ?Continue to monitor BP - 128/80 ? ?RTC 3 months ? ?Patient advised to contact office with any questions, adverse effects, or acute worsening in signs and symptoms. ? ?Discussed potential benefits, risk, and side effects of benzodiazepines to include potential risk of tolerance and dependence, as well as possible drowsiness.  Advised patient not to drive if experiencing drowsiness and to take lowest possible effective dose to minimize risk of dependence and tolerance. ? ?Discussed potential benefits, risks, and side effects of stimulants with patient to include increased heart rate, palpitations, insomnia, increased anxiety, increased irritability, or decreased appetite.  Instructed patient to contact office if experiencing any significant tolerability issues. ? ? ?Diagnoses and all orders for this visit: ? ?Major depressive disorder, recurrent episode, moderate (HCC) ?-     amphetamine-dextroamphetamine (ADDERALL) 30 MG tablet; Take 1 tablet by mouth 2 (two) times daily. ?-      amphetamine-dextroamphetamine (ADDERALL) 30 MG tablet; Take 1 tablet by mouth 2 (two) times daily. ?-     amphetamine-dextroamphetamine (ADDERALL) 30 MG tablet; Take 1 tablet by mouth 2 (two) times daily. ?-     citalopram

## 2021-06-09 ENCOUNTER — Telehealth: Payer: No Typology Code available for payment source | Admitting: Emergency Medicine

## 2021-06-09 ENCOUNTER — Encounter: Payer: Self-pay | Admitting: Orthopaedic Surgery

## 2021-06-09 ENCOUNTER — Ambulatory Visit (INDEPENDENT_AMBULATORY_CARE_PROVIDER_SITE_OTHER): Payer: No Typology Code available for payment source

## 2021-06-09 ENCOUNTER — Ambulatory Visit (INDEPENDENT_AMBULATORY_CARE_PROVIDER_SITE_OTHER): Payer: No Typology Code available for payment source | Admitting: Orthopaedic Surgery

## 2021-06-09 DIAGNOSIS — M25511 Pain in right shoulder: Secondary | ICD-10-CM | POA: Diagnosis not present

## 2021-06-09 DIAGNOSIS — M25561 Pain in right knee: Secondary | ICD-10-CM

## 2021-06-09 DIAGNOSIS — G8929 Other chronic pain: Secondary | ICD-10-CM

## 2021-06-09 DIAGNOSIS — N3000 Acute cystitis without hematuria: Secondary | ICD-10-CM | POA: Diagnosis not present

## 2021-06-09 MED ORDER — CEPHALEXIN 500 MG PO CAPS: 500.0000 mg | ORAL_CAPSULE | Freq: Two times a day (BID) | ORAL | 0 refills | Status: AC

## 2021-06-09 NOTE — Progress Notes (Signed)
E-Visit for Urinary Problems ? ?We are sorry that you are not feeling well.  Here is how we plan to help! ? ?Based on what you shared with me it looks like you most likely have a simple urinary tract infection. ? ?A UTI (Urinary Tract Infection) is a bacterial infection of the bladder. ? ?Most cases of urinary tract infections are simple to treat but a key part of your care is to encourage you to drink plenty of fluids and watch your symptoms carefully. ? ?I have prescribed Keflex 500 mg twice a day for 7 days.  Your symptoms should gradually improve. Call us if the burning in your urine worsens, you develop worsening fever, back pain or pelvic pain or if your symptoms do not resolve after completing the antibiotic. ? ?Urinary tract infections can be prevented by drinking plenty of water to keep your body hydrated.  Also be sure when you wipe, wipe from front to back and don't hold it in!  If possible, empty your bladder every 4 hours. ? ?HOME CARE ?Drink plenty of fluids ?Compete the full course of the antibiotics even if the symptoms resolve ?Remember, when you need to go?go. Holding in your urine can increase the likelihood of getting a UTI! ?GET HELP RIGHT AWAY IF: ?You cannot urinate ?You get a high fever ?Worsening back pain occurs ?You see blood in your urine ?You feel sick to your stomach or throw up ?You feel like you are going to pass out ? ?MAKE SURE YOU  ?Understand these instructions. ?Will watch your condition. ?Will get help right away if you are not doing well or get worse. ? ? ?Thank you for choosing an e-visit. ? ?Your e-visit answers were reviewed by a board certified advanced clinical practitioner to complete your personal care plan. Depending upon the condition, your plan could have included both over the counter or prescription medications. ? ?Please review your pharmacy choice. Make sure the pharmacy is open so you can pick up prescription now. If there is a problem, you may contact your  provider through MyChart messaging and have the prescription routed to another pharmacy.  Your safety is important to us. If you have drug allergies check your prescription carefully.  ? ?For the next 24 hours you can use MyChart to ask questions about today's visit, request a non-urgent call back, or ask for a work or school excuse. ?You will get an email in the next two days asking about your experience. I hope that your e-visit has been valuable and will speed your recovery. ? ?I have spent 5 minutes in review of e-visit questionnaire, review and updating patient chart, medical decision making and response to patient.  ? ?Sahan Pen, PhD, FNP-BC ?  ?
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
## 2021-06-13 ENCOUNTER — Ambulatory Visit (HOSPITAL_BASED_OUTPATIENT_CLINIC_OR_DEPARTMENT_OTHER)
Admission: RE | Admit: 2021-06-13 | Discharge: 2021-06-13 | Disposition: A | Discharge status: Home / Self Care | Payer: No Typology Code available for payment source | Source: Ambulatory Visit | Attending: Orthopaedic Surgery | Admitting: Orthopaedic Surgery

## 2021-06-13 DIAGNOSIS — M25511 Pain in right shoulder: Secondary | ICD-10-CM | POA: Insufficient documentation

## 2021-06-13 DIAGNOSIS — G8929 Other chronic pain: Secondary | ICD-10-CM | POA: Insufficient documentation

## 2021-06-17 ENCOUNTER — Encounter: Payer: Self-pay | Admitting: Orthopaedic Surgery

## 2021-06-17 ENCOUNTER — Ambulatory Visit (INDEPENDENT_AMBULATORY_CARE_PROVIDER_SITE_OTHER): Payer: No Typology Code available for payment source | Admitting: Orthopaedic Surgery

## 2021-06-17 DIAGNOSIS — M7541 Impingement syndrome of right shoulder: Secondary | ICD-10-CM | POA: Diagnosis not present

## 2021-06-17 NOTE — Progress Notes (Signed)
? ?Office Visit Note ?  ?Patient: Abigail Wright           ?Date of Birth: [birth year]           ?MRN: [id number] ?Visit Date: 06/17/2021 ?             ?Requested by: Sharlene DoryWendling, Nicholas Paul, DO ?[address] ?[address] ?[place],  [place] [id number] ?PCP: Sharlene DoryWendling, Nicholas Paul, DO ? ? ?Assessment & Plan: ?Visit Diagnoses:  ?1. Impingement syndrome of right shoulder   ? ? ?Plan: Abigail Wright's been experiencing right shoulder pain since she sustained a fall in December 2022.  Despite time injections and exercises she continues to have pain to the point of compromise and sleep deprivation.  An MRI scan was performed on 06/13/2021 demonstrating a large full-thickness near complete tear of the supraspinatus with 2.6 cm of retraction and a few intact posterior fibers.  There was moderate tendinosis of the infraspinatus tendon and a small insertional interstitial tear.  Teres minor was intact.  There was moderate tendinosis of the subscapularis tendon with a partial thickness tear.  No muscle atrophy or edema.  Moderate tendinosis of the intra-articular portion of the long head of the biceps tendon and moderate arthropathy of the AC joint.  No chondral defects of the glenohumeral joint.  No acute aggressive lesions and no labral pathology.  Long discussion regarding all of the above.  She like to proceed with surgery to include an arthroscopic SA D DCR and mini open rotator cuff tear repair.  I discussed the surgery and potential complications what she can expect postoperatively, need for physical therapy, time in a sling and the type of anesthetic.  We will set this up for her at her convenience over the next several weeks                                                                                                                                                                                                                                                                                                                                                                                                                                                                                                                                                                                                                                                                                                                                                                                                                                                                                                                                                                                                                                                     ? ?  Follow-Up Instructions: Return We will schedule rotator cuff tear repair surgery.  ? ?Orders:  ?No orders of the defined types were placed in this encounter. ? ?No orders of the defined types were placed in this encounter. ? ? ? ? Procedures: ?No procedures performed ? ? ?Clinical Data: ?No additional findings. ? ? ?Subjective: ?Chief Complaint  ?Patient presents with  ? Right Shoulder - Follow-up  ?  MRI review  ?Returns for review of the MRI scan right shoulder.  No change in symptoms.  Shoulder pain started after a fall in December 2022 ? ?HPI ? ?Review of Systems ? ? ?Objective: ?Vital Signs: There were no vitals taken for this visit. ? ?Physical Exam ?Constitutional:   ?   Appearance: She is well-developed.  ?Eyes:  ?   Pupils: Pupils are equal, round, and reactive to light.  ?Pulmonary:  ?   Effort: Pulmonary effort is normal.  ?Skin: ?   General: Skin is warm and dry.  ?Neurological:  ?   Mental Status: She is alert and oriented to person, place, and time.  ?Psychiatric:     ?   Behavior: Behavior normal.  ? ? ?Ortho Exam right shoulder with full overhead motion but circuitous arc of motion.  Positive impingement on the extreme of external rotation.  Positive empty can testing.  Speeds sign was negative for any biceps  pathology.  Good grip and good release and good strength.  No evidence of instability.  No evidence of adhesive capsulitis ? ?Specialty Comments:  ?No specialty comments available. ? ?Imaging: ?No results found. ? ? ?PMFS History: ?Patient Active Problem List  ? Diagnosis Date Noted  ? Impingement syndrome of right shoulder 06/17/2021  ? Major depressive disorder, recurrent episode, moderate (HCC) 03/06/2021  ? Pain in left knee 11/04/2017  ? Benign paroxysmal positional vertigo of left ear 05/15/2015  ? Family history of breast cancer in mother 08/02/2011  ? ?Past Medical History:  ?Diagnosis Date  ? ADD (attention deficit disorder)   ? Anxiety   ? Blood transfusion without reported diagnosis   ? 1993  ? Depression   ? GERD (gastroesophageal reflux disease)   ? in past but recently controlled with diet and exercise.  ? Hyperlipidemia   ?  ?Family History  ?Problem Relation Age of Onset  ? Cancer Mother   ? Diabetes Father   ? Heart disease Paternal Uncle   ? Hypertension Paternal Uncle   ? Colon cancer Neg Hx   ? Esophageal cancer Neg Hx   ? Rectal cancer Neg Hx   ? Stomach cancer Neg Hx   ?  ?Past Surgical History:  ?Procedure Laterality Date  ? ABDOMINAL HYSTERECTOMY    ? BUNIONECTOMY Left   ? CARPAL TUNNEL RELEASE Bilateral   ? cyst removal from left hand Left   ? FOOT SURGERY    ? KNEE CARTILAGE SURGERY    ? planters fascitis    ? TENDON REPAIR Left 03/21/2017  ? Left hand   ? TONSILLECTOMY    ? ?Social History  ? ?Occupational History  ? Not on file  ?Tobacco Use  ? Smoking status: Former  ? Smokeless tobacco: Never  ?Vaping Use  ? Vaping Use: Some days  ?Substance and Sexual Activity  ? Alcohol use: Not Currently  ? Drug use: No  ? Sexual activity: Yes  ?  Partners: Male  ?  Birth control/protection: None, Post-menopausal  ? ? ? ?Valeria Batman, MD ? ? ?Note - This record  has been created using [employer].  ?Chart creation errors have been sought, but may not always  ?have been located. Such creation  errors do not reflect on  ?the standard of medical care. ? ?

## 2021-06-22 NOTE — H&P (Signed)
Abigail Wright is an 56 y.o. female.   ?Chief Complaint: Right Shoulder Pain ?HPI: Patient has a 48-month history of right shoulder pain after falling.  She had an MRI which demonstrated a large near full-thickness tear of the supraspinatus with 2.6 cm of retraction ? ?Past Medical History:  ?Diagnosis Date  ? ADD (attention deficit disorder)   ? Anxiety   ? Blood transfusion without reported diagnosis   ? 1993  ? Depression   ? GERD (gastroesophageal reflux disease)   ? in past but recently controlled with diet and exercise.  ? Hyperlipidemia   ? ? ?Past Surgical History:  ?Procedure Laterality Date  ? ABDOMINAL HYSTERECTOMY    ? BUNIONECTOMY Left   ? CARPAL TUNNEL RELEASE Bilateral   ? cyst removal from left hand Left   ? FOOT SURGERY    ? KNEE CARTILAGE SURGERY    ? planters fascitis    ? TENDON REPAIR Left 03/21/2017  ? Left hand   ? TONSILLECTOMY    ? ? ?Family History  ?Problem Relation Age of Onset  ? Cancer Mother   ? Diabetes Father   ? Heart disease Paternal Uncle   ? Hypertension Paternal Uncle   ? Colon cancer Neg Hx   ? Esophageal cancer Neg Hx   ? Rectal cancer Neg Hx   ? Stomach cancer Neg Hx   ? ?Social History:  reports that she has quit smoking. She has never used smokeless tobacco. She reports that she does not currently use alcohol. She reports that she does not use drugs. ? ?Allergies: No Known Allergies ? ?No medications prior to admission.  ? ? ?No results found for this or any previous visit (from the past 48 hour(s)). ?No results found. ? ?Review of Systems  ?All other systems reviewed and are negative. ? ?There were no vitals taken for this visit. ?Physical Exam  ?Examination of her right shoulder she does have full overhead motion but circuitous arc of motion.  She has positive empty can test and impingement findings speeds sign was negative distal circulation and sensation intact ?Heart regular rate and rhythm lungs clear ?Assessment/Plan ?Rotator cuff tear right shoulder she has failed  conservative treatment.  An extension discussion was had concerning conservative versus surgical treatment.  Recovery and expectations were also discussed.  She would like to go forward with a right shoulder arthroscopy subacromial decompression distal clavicle excision and repair of the rotator cuff with a mini incision risks of the procedure were discussed with her including bleeding infection recurrent tear continued pain ? ?[place] Tacy Chavis, PA ?06/22/2021, 12:42 PM ? ? ? ?

## 2021-06-29 NOTE — Progress Notes (Signed)
DUE TO COVID-19 ONLY ONE VISITOR IS ALLOWED TO COME WITH YOU AND STAY IN THE WAITING ROOM ONLY DURING PRE OP AND PROCEDURE DAY OF SURGERY.  2 VISITOR  MAY VISIT WITH YOU AFTER SURGERY IN YOUR PRIVATE ROOM DURING VISITING HOURS ONLY! YOU MAY HAVE ONE PERSON SPEND THE NITE WITH YOU IN YOUR ROOM AFTER SURGERY.    YOU NEED TO HAVE A COVID 19 TEST ON                                 COME THRU MAIN ENTRANCE AT [facility] HAVE A SEAT IN THE LOBBY ON THE RIGHT AS YOU COME THRU THE DOOR.  CALL [phone] AND GIVE THEM YOUR NAME AND LET THEM KNOW YOU ARE HERE FOR COVID TESTING.    Your procedure is scheduled on:    Report to [facility] Main  Entrance   Report to admitting at                AM DO NOT BRING INSURANCE CARD, PICTURE ID OR WALLET DAY OF SURGERY.      Call this number if you have problems the morning of surgery [phone]    REMEMBER: NO  SOLID FOODS , CANDY, GUM OR MINTS AFTER MIDNITE THE NITE BEFORE SURGERY .       . CLEAR LIQUIDS UNTIL               DAY OF SURGERY.      PLEASE FINISH ENSURE DRINK PER SURGEON ORDER  WHICH NEEDS TO BE COMPLETED AT         MORNING OF SURGERY.       CLEAR LIQUID DIET   Foods Allowed      WATER BLACK COFFEE ( SUGAR OK, NO MILK, CREAM OR CREAMER) REGULAR AND DECAF  TEA ( SUGAR OK NO MILK, CREAM, OR CREAMER) REGULAR AND DECAF  PLAIN JELLO ( NO RED)  FRUIT ICES ( NO RED, NO FRUIT PULP)  POPSICLES ( NO RED)  JUICE- APPLE, WHITE GRAPE AND WHITE CRANBERRY  SPORT DRINK LIKE GATORADE ( NO RED)  CLEAR BROTH ( VEGETABLE , CHICKEN OR BEEF)                                                                     BRUSH YOUR TEETH MORNING OF SURGERY AND RINSE YOUR MOUTH OUT, NO CHEWING GUM CANDY OR MINTS.     Take these medicines the morning of surgery with A SIP OF WATER:    DO NOT TAKE ANY DIABETIC MEDICATIONS DAY OF YOUR SURGERY                               You may not have any metal on your body including hair pins and               piercings  Do not wear jewelry, make-up, lotions, powders or perfumes, deodorant             Do not wear nail polish on your fingernails.              IF YOU ARE   A FEMALE AND WANT TO SHAVE UNDER ARMS OR LEGS PRIOR TO SURGERY YOU MUST DO SO AT LEAST 48 HOURS PRIOR TO SURGERY.              Men may shave face and neck.   Do not bring valuables to the hospital. [facility] IS NOT             RESPONSIBLE   FOR VALUABLES.  Contacts, dentures or bridgework may not be worn into surgery.  Leave suitcase in the car. After surgery it may be brought to your room.     Patients discharged the day of surgery will not be allowed to drive home. IF YOU ARE HAVING SURGERY AND GOING HOME THE SAME DAY, YOU MUST HAVE AN ADULT TO DRIVE YOU HOME AND BE WITH YOU FOR 24 HOURS. YOU MAY GO HOME BY TAXI OR UBER OR ORTHERWISE, BUT AN ADULT MUST ACCOMPANY YOU HOME AND STAY WITH YOU FOR 24 HOURS.                Please read over the following fact sheets you were given: _____________________________________________________________________  [place] - Preparing for Surgery Before surgery, you can play an important role.  Because skin is not sterile, your skin needs to be as free of germs as possible.  You can reduce the number of germs on your skin by washing with CHG (chlorahexidine gluconate) soap before surgery.  CHG is an antiseptic cleaner which kills germs and bonds with the skin to continue killing germs even after washing. Please DO NOT use if you have an allergy to CHG or antibacterial soaps.  If your skin becomes reddened/irritated stop using the CHG and inform your nurse when you arrive at Short Stay. Do not shave (including legs and underarms) for at least 48 hours prior to the first CHG shower.  You may shave your face/neck. Please follow these instructions carefully:  1.  Shower with CHG Soap the night before surgery and the  morning of Surgery.  2.  If you choose to wash your hair, wash your hair first as  usual with your  normal  shampoo.  3.  After you shampoo, rinse your hair and body thoroughly to remove the  shampoo.                           4.  Use CHG as you would any other liquid soap.  You can apply chg directly  to the skin and wash                       Gently with a scrungie or clean washcloth.  5.  Apply the CHG Soap to your body ONLY FROM THE NECK DOWN.   Do not use on face/ open                           Wound or open sores. Avoid contact with eyes, ears mouth and genitals (private parts).                       Wash face,  Genitals (private parts) with your normal soap.             6.  Wash thoroughly, paying special attention to the area where your surgery  will be performed.  7.  Thoroughly rinse your body with warm water from the   neck down.  8.  DO NOT shower/wash with your normal soap after using and rinsing off  the CHG Soap.                9.  Pat yourself dry with a clean towel.            10.  Wear clean pajamas.            11.  Place clean sheets on your bed the night of your first shower and do not  sleep with pets. Day of Surgery : Do not apply any lotions/deodorants the morning of surgery.  Please wear clean clothes to the hospital/surgery center.  FAILURE TO FOLLOW THESE INSTRUCTIONS MAY RESULT IN THE CANCELLATION OF YOUR SURGERY PATIENT SIGNATURE_________________________________  NURSE SIGNATURE__________________________________  ________________________________________________________________________               

## 2021-06-29 NOTE — Progress Notes (Addendum)
Anesthesia Review: ? ?PCP: DR Wende Mott 03/06/21  ?Cardiologist : none  ?Chest x-ray : ?EKG :06/30/2021  ?Echo : ?Stress test: ?Cardiac Cath :  ?Activity level: can do a flight of stairs without difficulty  ?Sleep Study/ CPAP : none  ?Fasting Blood Sugar :      / Checks Blood Sugar -- times a day:   ?Blood Thinner/ Instructions /Last Dose: ?ASA / Instructions/ Last Dose :   ?Blood pressure at preop on 06/30/21 was 149/100 in right arm and in left arm was 149/102.  PT denies any chest pain , shortness of breath, dizziness , headache or blurred vision.  PT states she has been in pain due to her shoulder and has not been sleeping well at nite.  Pt has blood pressure cuff at home and will monitor blood pressure.  PT will be in touch with PCP if blood pressure continues to be elevated.  Nelle Don made aware.  No new orders given.  ?

## 2021-06-29 NOTE — Progress Notes (Signed)
DUE TO COVID-19 ONLY   2  VISITOR IS ALLOWED TO COME WITH YOU AND STAY IN THE WAITING ROOM ONLY DURING PRE OP AND PROCEDURE DAY OF SURGERY.   4  VISITOR  MAY VISIT WITH YOU AFTER SURGERY IN YOUR PRIVATE ROOM DURING VISITING HOURS ONLY! ?YOU MAY HAVE ONE PERSON SPEND THE NITE WITH YOU IN YOUR ROOM AFTER SURGERY.   ? ? ? Your procedure is scheduled on:  ?V5/16/2023  ? Report to [facility] Main  Entrance ? ? Report to admitting at        0515          AM ?DO NOT BRING INSURANCE CARD, PICTURE ID OR WALLET DAY OF SURGERY.  ?  ? ? Call this number if you have problems the morning of surgery [phone]  ? ? REMEMBER: NO  SOLID FOODS , CANDY, GUM OR MINTS AFTER MIDNITE THE NITE BEFORE SURGERY .       [place] Kitchen CLEAR LIQUIDS UNTIL    0415am            DAY OF SURGERY.      PLEASE FINISH ENSURE DRINK PER SURGEON ORDER  WHICH NEEDS TO BE COMPLETED AT      0415 am     MORNING OF SURGERY.   ? ? ? ? ?CLEAR LIQUID DIET ? ? ?Foods Allowed      ?WATER ?BLACK COFFEE ( SUGAR OK, NO MILK, CREAM OR CREAMER) REGULAR AND DECAF  ?TEA ( SUGAR OK NO MILK, CREAM, OR CREAMER) REGULAR AND DECAF  ?PLAIN JELLO ( NO RED)  ?FRUIT ICES ( NO RED, NO FRUIT PULP)  ?POPSICLES ( NO RED)  ?JUICE- APPLE, WHITE GRAPE AND WHITE CRANBERRY  ?SPORT DRINK LIKE GATORADE ( NO RED)  ?CLEAR BROTH ( VEGETABLE , CHICKEN OR BEEF)                                                               ? ?    ? ?BRUSH YOUR TEETH MORNING OF SURGERY AND RINSE YOUR MOUTH OUT, NO CHEWING GUM CANDY OR MINTS. ?  ? ? Take these medicines the morning of surgery with A SIP OF WATER:  zyrtec, citalopram , flonase  ? ? ?DO NOT TAKE ANY DIABETIC MEDICATIONS DAY OF YOUR SURGERY ?                  ?            You may not have any metal on your body including hair pins and  ?            piercings  Do not wear jewelry, make-up, lotions, powders or perfumes, deodorant ?            Do not wear nail polish on your fingernails.   ?           IF YOU ARE A FEMALE AND WANT TO SHAVE UNDER ARMS OR  LEGS PRIOR TO SURGERY YOU MUST DO SO AT LEAST 48 HOURS PRIOR TO SURGERY.  ?            Men may shave face and neck. ? ? Do not bring valuables to the hospital. [facility] IS NOT ?  RESPONSIBLE   FOR VALUABLES. ? Contacts, dentures or bridgework may not be worn into surgery. ? Leave suitcase in the car. After surgery it may be brought to your room. ? ?  ? Patients discharged the day of surgery will not be allowed to drive home. IF YOU ARE HAVING SURGERY AND GOING HOME THE SAME DAY, YOU MUST HAVE AN ADULT TO DRIVE YOU HOME AND BE WITH YOU FOR 24 HOURS. YOU MAY GO HOME BY TAXI OR UBER OR ORTHERWISE, BUT AN ADULT MUST ACCOMPANY YOU HOME AND STAY WITH YOU FOR 24 HOURS. ?  ? ?            Please read over the following fact sheets you were given: ?_____________________________________________________________________ ? ?[place] - Preparing for Surgery ?Before surgery, you can play an important role.  Because skin is not sterile, your skin needs to be as free of germs as possible.  You can reduce the number of germs on your skin by washing with CHG (chlorahexidine gluconate) soap before surgery.  CHG is an antiseptic cleaner which kills germs and bonds with the skin to continue killing germs even after washing. ?Please DO NOT use if you have an allergy to CHG or antibacterial soaps.  If your skin becomes reddened/irritated stop using the CHG and inform your nurse when you arrive at Short Stay. ?Do not shave (including legs and underarms) for at least 48 hours prior to the first CHG shower.  You may shave your face/neck. ?Please follow these instructions carefully: ? 1.  Shower with CHG Soap the night before surgery and the  morning of Surgery. ? 2.  If you choose to wash your hair, wash your hair first as usual with your  normal  shampoo. ? 3.  After you shampoo, rinse your hair and body thoroughly to remove the  shampoo.                           4.  Use CHG as you would any other liquid soap.  You can apply  chg directly  to the skin and wash  ?                     Gently with a scrungie or clean washcloth. ? 5.  Apply the CHG Soap to your body ONLY FROM THE NECK DOWN.   Do not use on face/ open      ?                     Wound or open sores. Avoid contact with eyes, ears mouth and genitals (private parts).  ?                     Production manager,  Genitals (private parts) with your normal soap. ?            6.  Wash thoroughly, paying special attention to the area where your surgery  will be performed. ? 7.  Thoroughly rinse your body with warm water from the neck down. ? 8.  DO NOT shower/wash with your normal soap after using and rinsing off  the CHG Soap. ?               9.  Pat yourself dry with a clean towel. ?           10.  Wear clean pajamas. ?  11.  Place clean sheets on your bed the night of your first shower and do not  sleep with pets. ?Day of Surgery : ?Do not apply any lotions/deodorants the morning of surgery.  Please wear clean clothes to the hospital/surgery center. ? ?FAILURE TO FOLLOW THESE INSTRUCTIONS MAY RESULT IN THE CANCELLATION OF YOUR SURGERY ?PATIENT SIGNATURE_________________________________ ? ?NURSE SIGNATURE__________________________________ ? ?________________________________________________________________________  ? ? ?           ?

## 2021-06-30 ENCOUNTER — Encounter (HOSPITAL_COMMUNITY)
Admission: RE | Admit: 2021-06-30 | Discharge: 2021-06-30 | Disposition: A | Discharge status: Home / Self Care | Payer: No Typology Code available for payment source | Source: Ambulatory Visit | Attending: Orthopaedic Surgery | Admitting: Orthopaedic Surgery

## 2021-06-30 ENCOUNTER — Encounter (HOSPITAL_COMMUNITY): Payer: Self-pay

## 2021-06-30 ENCOUNTER — Other Ambulatory Visit: Payer: Self-pay

## 2021-06-30 VITALS — BP 149/100 | HR 82 | Temp 98.4°F | Resp 16 | Ht 68.0 in

## 2021-06-30 DIAGNOSIS — Z01818 Encounter for other preprocedural examination: Secondary | ICD-10-CM | POA: Diagnosis present

## 2021-06-30 HISTORY — DX: Unspecified osteoarthritis, unspecified site: M19.90

## 2021-06-30 LAB — CBC
HCT: 44.3 % (ref 36.0–46.0)
Hemoglobin: 14.3 g/dL (ref 12.0–15.0)
MCH: 28.5 pg (ref 26.0–34.0)
MCHC: 32.3 g/dL (ref 30.0–36.0)
MCV: 88.2 fL (ref 80.0–100.0)
Platelets: 275 10*3/uL (ref 150–400)
RBC: 5.02 MIL/uL (ref 3.87–5.11)
RDW: 13.3 % (ref 11.5–15.5)
WBC: 7.9 10*3/uL (ref 4.0–10.5)
nRBC: 0 % (ref 0.0–0.2)

## 2021-07-03 ENCOUNTER — Telehealth: Payer: Self-pay | Admitting: Orthopaedic Surgery

## 2021-07-03 ENCOUNTER — Other Ambulatory Visit: Payer: Self-pay

## 2021-07-03 DIAGNOSIS — G8929 Other chronic pain: Secondary | ICD-10-CM

## 2021-07-03 NOTE — Telephone Encounter (Signed)
Patient aware we have sent referral for MRI ?

## 2021-07-03 NOTE — Telephone Encounter (Signed)
She was wondering if she has to get a shot before she can just have an MRI on her knee? ?

## 2021-07-03 NOTE — Telephone Encounter (Signed)
Patient called advised her right knee is swollen,catching and experiencing a lot of pain. Patient said she didn't know whether she could have an MRI this soon or not. ? Patient asked what can be done prior to her having surgery Tuesday. The number tom contact patient is [phone] ?

## 2021-07-06 ENCOUNTER — Encounter (HOSPITAL_COMMUNITY): Payer: Self-pay | Admitting: Orthopaedic Surgery

## 2021-07-06 NOTE — Anesthesia Preprocedure Evaluation (Addendum)
Anesthesia Evaluation  ?Patient identified by MRN, date of birth, ID band ?Patient awake ? ? ? ?Reviewed: ?Allergy & Precautions, NPO status , Patient's Chart, lab work & pertinent test results ? ?Airway ?Mallampati: II ? ?TM Distance: >3 FB ?Neck ROM: Full ? ? ? Dental ?no notable dental hx. ?(+) Teeth Intact, Caps, Dental Advisory Given ?  ?Pulmonary ?former smoker,  ?  ?Pulmonary exam normal ?breath sounds clear to auscultation ? ? ? ? ? ? Cardiovascular ?negative cardio ROS ?Normal cardiovascular exam ?Rhythm:Regular Rate:Normal ? ? ?  ?Neuro/Psych ?PSYCHIATRIC DISORDERS Anxiety Depression ADD  ? GI/Hepatic ?Neg liver ROS, GERD  ,  ?Endo/Other  ?Obesity ?Hyperlipidemia ? Renal/GU ?negative Renal ROS  ?negative genitourinary ?  ?Musculoskeletal ? ?(+) Arthritis , Osteoarthritis,  Impingement syndrome right shoulder  ? Abdominal ?(+) + obese,   ?Peds ? Hematology ?negative hematology ROS ?(+)   ?Anesthesia Other Findings ? ? Reproductive/Obstetrics ? ?  ? ? ? ? ? ? ? ? ? ? ? ? ? ?  ?  ? ? ? ? ? ? ?Anesthesia Physical ?Anesthesia Plan ? ?ASA: 2 ? ?Anesthesia Plan: General  ? ?Post-op Pain Management: Regional block*  ? ?Induction: Intravenous ? ?PONV Risk Score and Plan: 4 or greater and Treatment may vary due to age or medical condition, Midazolam, Ondansetron, Dexamethasone and Scopolamine patch - Pre-op ? ?Airway Management Planned: Oral ETT ? ?Additional Equipment: None ? ?Intra-op Plan:  ? ?Post-operative Plan: Extubation in OR ? ?Informed Consent: I have reviewed the patients History and Physical, chart, labs and discussed the procedure including the risks, benefits and alternatives for the proposed anesthesia with the patient or authorized representative who has indicated his/her understanding and acceptance.  ? ? ? ?Dental advisory given ? ?Plan Discussed with: Anesthesiologist and CRNA ? ?Anesthesia Plan Comments:   ? ? ? ? ? ?Anesthesia Quick Evaluation ? ?

## 2021-07-07 ENCOUNTER — Ambulatory Visit (HOSPITAL_COMMUNITY): Payer: No Typology Code available for payment source | Admitting: Physician Assistant

## 2021-07-07 ENCOUNTER — Other Ambulatory Visit: Payer: Self-pay | Admitting: Orthopaedic Surgery

## 2021-07-07 ENCOUNTER — Encounter (HOSPITAL_COMMUNITY)
Admission: RE | Disposition: A | Discharge status: Home / Self Care | Payer: Self-pay | Source: Home / Self Care | Attending: Orthopaedic Surgery

## 2021-07-07 ENCOUNTER — Ambulatory Visit (HOSPITAL_COMMUNITY)
Admission: RE | Admit: 2021-07-07 | Discharge: 2021-07-07 | Disposition: A | Discharge status: Home / Self Care | Payer: No Typology Code available for payment source | Attending: Orthopaedic Surgery | Admitting: Orthopaedic Surgery

## 2021-07-07 ENCOUNTER — Encounter (HOSPITAL_COMMUNITY): Payer: Self-pay | Admitting: Orthopaedic Surgery

## 2021-07-07 ENCOUNTER — Other Ambulatory Visit: Payer: Self-pay

## 2021-07-07 ENCOUNTER — Ambulatory Visit (HOSPITAL_BASED_OUTPATIENT_CLINIC_OR_DEPARTMENT_OTHER): Payer: No Typology Code available for payment source | Admitting: Anesthesiology

## 2021-07-07 DIAGNOSIS — Z87891 Personal history of nicotine dependence: Secondary | ICD-10-CM | POA: Diagnosis not present

## 2021-07-07 DIAGNOSIS — S46011A Strain of muscle(s) and tendon(s) of the rotator cuff of right shoulder, initial encounter: Secondary | ICD-10-CM | POA: Insufficient documentation

## 2021-07-07 DIAGNOSIS — F419 Anxiety disorder, unspecified: Secondary | ICD-10-CM | POA: Insufficient documentation

## 2021-07-07 DIAGNOSIS — X58XXXA Exposure to other specified factors, initial encounter: Secondary | ICD-10-CM | POA: Insufficient documentation

## 2021-07-07 DIAGNOSIS — E669 Obesity, unspecified: Secondary | ICD-10-CM | POA: Diagnosis not present

## 2021-07-07 DIAGNOSIS — M19011 Primary osteoarthritis, right shoulder: Secondary | ICD-10-CM

## 2021-07-07 DIAGNOSIS — E785 Hyperlipidemia, unspecified: Secondary | ICD-10-CM | POA: Diagnosis not present

## 2021-07-07 DIAGNOSIS — M7541 Impingement syndrome of right shoulder: Secondary | ICD-10-CM | POA: Diagnosis not present

## 2021-07-07 DIAGNOSIS — Z6836 Body mass index (BMI) 36.0-36.9, adult: Secondary | ICD-10-CM | POA: Insufficient documentation

## 2021-07-07 DIAGNOSIS — M7521 Bicipital tendinitis, right shoulder: Secondary | ICD-10-CM | POA: Diagnosis not present

## 2021-07-07 DIAGNOSIS — K219 Gastro-esophageal reflux disease without esophagitis: Secondary | ICD-10-CM | POA: Insufficient documentation

## 2021-07-07 DIAGNOSIS — M75101 Unspecified rotator cuff tear or rupture of right shoulder, not specified as traumatic: Secondary | ICD-10-CM

## 2021-07-07 DIAGNOSIS — F32A Depression, unspecified: Secondary | ICD-10-CM | POA: Insufficient documentation

## 2021-07-07 DIAGNOSIS — S46211A Strain of muscle, fascia and tendon of other parts of biceps, right arm, initial encounter: Secondary | ICD-10-CM | POA: Diagnosis not present

## 2021-07-07 DIAGNOSIS — M75121 Complete rotator cuff tear or rupture of right shoulder, not specified as traumatic: Secondary | ICD-10-CM | POA: Diagnosis not present

## 2021-07-07 HISTORY — PX: SHOULDER ARTHROSCOPY WITH SUBACROMIAL DECOMPRESSION AND OPEN ROTATOR C: SHX5688

## 2021-07-07 SURGERY — SHOULDER ARTHROSCOPY WITH SUBACROMIAL DECOMPRESSION AND OPEN ROTATOR CUFF REPAIR, OPEN BICEPS TENDON REPAIR
Anesthesia: General | Laterality: Right

## 2021-07-07 MED ORDER — PROPOFOL 10 MG/ML IV BOLUS
INTRAVENOUS | Status: AC
  Filled 2021-07-07: qty 20

## 2021-07-07 MED ORDER — SUGAMMADEX SODIUM 200 MG/2ML IV SOLN
INTRAVENOUS | Status: DC | PRN
  Administered 2021-07-07: 200 mg via INTRAVENOUS

## 2021-07-07 MED ORDER — OXYCODONE HCL 5 MG PO TABS
5.0000 mg | ORAL_TABLET | Freq: Once | ORAL | Status: AC | PRN
  Administered 2021-07-07: 5 mg via ORAL

## 2021-07-07 MED ORDER — ONDANSETRON HCL 4 MG/2ML IJ SOLN
INTRAMUSCULAR | Status: DC | PRN
  Administered 2021-07-07: 4 mg via INTRAVENOUS

## 2021-07-07 MED ORDER — PHENYLEPHRINE HCL (PRESSORS) 10 MG/ML IV SOLN
INTRAVENOUS | Status: AC
  Filled 2021-07-07: qty 1

## 2021-07-07 MED ORDER — ORAL CARE MOUTH RINSE: 15.0000 mL | Freq: Once | OROMUCOSAL | Status: AC

## 2021-07-07 MED ORDER — ROCURONIUM BROMIDE 10 MG/ML (PF) SYRINGE
PREFILLED_SYRINGE | INTRAVENOUS | Status: DC | PRN
Start: 2021-07-07 — End: 2021-07-07
  Administered 2021-07-07: 70 mg via INTRAVENOUS

## 2021-07-07 MED ORDER — MIDAZOLAM HCL 5 MG/5ML IJ SOLN
INTRAMUSCULAR | Status: DC | PRN
  Administered 2021-07-07: 2 mg via INTRAVENOUS

## 2021-07-07 MED ORDER — BUPIVACAINE HCL (PF) 0.5 % IJ SOLN
INTRAMUSCULAR | Status: DC | PRN
Start: 2021-07-07 — End: 2021-07-07
  Administered 2021-07-07: 15 mL via PERINEURAL

## 2021-07-07 MED ORDER — OXYCODONE HCL 5 MG PO TABS
ORAL_TABLET | ORAL | Status: AC
  Filled 2021-07-07: qty 1

## 2021-07-07 MED ORDER — FENTANYL CITRATE (PF) 100 MCG/2ML IJ SOLN
INTRAMUSCULAR | Status: AC
  Filled 2021-07-07: qty 2

## 2021-07-07 MED ORDER — OXYCODONE-ACETAMINOPHEN 5-325 MG PO TABS: 1.0000 | ORAL_TABLET | ORAL | 0 refills | Status: DC | PRN

## 2021-07-07 MED ORDER — SCOPOLAMINE 1 MG/3DAYS TD PT72
MEDICATED_PATCH | TRANSDERMAL | Status: DC | PRN
  Administered 2021-07-07: 1 via TRANSDERMAL

## 2021-07-07 MED ORDER — DEXAMETHASONE SODIUM PHOSPHATE 10 MG/ML IJ SOLN
INTRAMUSCULAR | Status: AC
  Filled 2021-07-07: qty 1

## 2021-07-07 MED ORDER — AMISULPRIDE (ANTIEMETIC) 5 MG/2ML IV SOLN: 10.0000 mg | Freq: Once | INTRAVENOUS | Status: DC | PRN

## 2021-07-07 MED ORDER — HYDROMORPHONE HCL 1 MG/ML IJ SOLN
0.2500 mg | INTRAMUSCULAR | Status: DC | PRN
  Administered 2021-07-07: 0.5 mg via INTRAVENOUS

## 2021-07-07 MED ORDER — OXYCODONE HCL 5 MG/5ML PO SOLN: 5.0000 mg | Freq: Once | ORAL | Status: AC | PRN

## 2021-07-07 MED ORDER — BUPIVACAINE-EPINEPHRINE (PF) 0.25% -1:200000 IJ SOLN
INTRAMUSCULAR | Status: AC
  Filled 2021-07-07: qty 30

## 2021-07-07 MED ORDER — DEXAMETHASONE SODIUM PHOSPHATE 10 MG/ML IJ SOLN
INTRAMUSCULAR | Status: DC | PRN
Start: 2021-07-07 — End: 2021-07-07
  Administered 2021-07-07: 10 mg via INTRAVENOUS

## 2021-07-07 MED ORDER — CEFAZOLIN SODIUM-DEXTROSE 2-4 GM/100ML-% IV SOLN
2.0000 g | INTRAVENOUS | Status: AC
  Administered 2021-07-07: 2 g via INTRAVENOUS
  Filled 2021-07-07: qty 100

## 2021-07-07 MED ORDER — ONDANSETRON HCL 4 MG/2ML IJ SOLN
INTRAMUSCULAR | Status: AC
  Filled 2021-07-07: qty 2

## 2021-07-07 MED ORDER — FENTANYL CITRATE (PF) 100 MCG/2ML IJ SOLN
INTRAMUSCULAR | Status: DC | PRN
  Administered 2021-07-07 (×3): 50 ug via INTRAVENOUS

## 2021-07-07 MED ORDER — CHLORHEXIDINE GLUCONATE 0.12 % MT SOLN
15.0000 mL | Freq: Once | OROMUCOSAL | Status: AC
  Administered 2021-07-07: 15 mL via OROMUCOSAL

## 2021-07-07 MED ORDER — HYDROMORPHONE HCL 1 MG/ML IJ SOLN
INTRAMUSCULAR | Status: AC
  Filled 2021-07-07: qty 2

## 2021-07-07 MED ORDER — PHENYLEPHRINE HCL-NACL 20-0.9 MG/250ML-% IV SOLN
INTRAVENOUS | Status: DC | PRN
  Administered 2021-07-07: 25 ug/min via INTRAVENOUS

## 2021-07-07 MED ORDER — BUPIVACAINE-EPINEPHRINE 0.25% -1:200000 IJ SOLN
INTRAMUSCULAR | Status: DC | PRN
  Administered 2021-07-07: 15 mL

## 2021-07-07 MED ORDER — SCOPOLAMINE 1 MG/3DAYS TD PT72
MEDICATED_PATCH | TRANSDERMAL | Status: AC
  Filled 2021-07-07: qty 1

## 2021-07-07 MED ORDER — LACTATED RINGERS IV SOLN: INTRAVENOUS | Status: DC

## 2021-07-07 MED ORDER — SODIUM CHLORIDE 0.9 % IR SOLN
Status: DC | PRN
  Administered 2021-07-07 (×2): 3000 mL

## 2021-07-07 MED ORDER — ROCURONIUM BROMIDE 10 MG/ML (PF) SYRINGE
PREFILLED_SYRINGE | INTRAVENOUS | Status: AC
  Filled 2021-07-07: qty 10

## 2021-07-07 MED ORDER — MIDAZOLAM HCL 2 MG/2ML IJ SOLN
INTRAMUSCULAR | Status: AC
  Filled 2021-07-07: qty 2

## 2021-07-07 MED ORDER — PROPOFOL 10 MG/ML IV BOLUS
INTRAVENOUS | Status: DC | PRN
  Administered 2021-07-07: 150 mg via INTRAVENOUS

## 2021-07-07 MED ORDER — ONDANSETRON HCL 4 MG/2ML IJ SOLN: 4.0000 mg | Freq: Once | INTRAMUSCULAR | Status: DC | PRN

## 2021-07-07 MED ORDER — LIDOCAINE 2% (20 MG/ML) 5 ML SYRINGE
INTRAMUSCULAR | Status: DC | PRN
  Administered 2021-07-07: 60 mg via INTRAVENOUS

## 2021-07-07 MED ORDER — BUPIVACAINE LIPOSOME 1.3 % IJ SUSP
INTRAMUSCULAR | Status: DC | PRN
  Administered 2021-07-07: 10 mL via PERINEURAL

## 2021-07-07 SURGICAL SUPPLY — 70 items
ANCHOR SUT QUATTRO X2 5.5 (Anchor) ×1 IMPLANT
BAG COUNTER SPONGE SURGICOUNT (BAG) IMPLANT
BAG DECANTER FOR FLEXI CONT (MISCELLANEOUS) IMPLANT
BENZOIN TINCTURE PRP APPL 2/3 (GAUZE/BANDAGES/DRESSINGS) ×1 IMPLANT
BLADE AVERAGE 25X9 (BLADE) IMPLANT
BLADE EXCALIBUR 4.0X13 (MISCELLANEOUS) ×1 IMPLANT
BLADE SURG SZ11 CARB STEEL (BLADE) ×2 IMPLANT
BURR CLEARCUT OVAL 5.5X13 (MISCELLANEOUS) IMPLANT
BURR OVAL 12 FL 5.5X13 (MISCELLANEOUS) ×1
BURR OVAL 8 FLU 4.0X13 (MISCELLANEOUS) ×1 IMPLANT
BURR OVAL 8 FLU 5.0X13 (MISCELLANEOUS) ×2 IMPLANT
CANNULA 5.75X71 LONG (CANNULA) IMPLANT
CANNULA ACUFLEX KIT 5X76 (CANNULA) ×2 IMPLANT
CANNULA TWIST IN 8.25X7CM (CANNULA) ×1 IMPLANT
DISSECTOR 4.0MM X 13CM (MISCELLANEOUS) ×2 IMPLANT
DRAPE IMP U-DRAPE 54X76 (DRAPES) ×2 IMPLANT
DRAPE SHOULDER BEACH CHAIR (DRAPES) ×2 IMPLANT
DRAPE SURG 17X23 STRL (DRAPES) ×2 IMPLANT
DRAPE U-SHAPE 47X51 STRL (DRAPES) ×2 IMPLANT
DRSG EMULSION OIL 3X3 NADH (GAUZE/BANDAGES/DRESSINGS) ×4 IMPLANT
DRSG PAD ABDOMINAL 8X10 ST (GAUZE/BANDAGES/DRESSINGS) ×4 IMPLANT
DURAPREP 26ML APPLICATOR (WOUND CARE) ×2 IMPLANT
ELECT NDL TIP 2.8 STRL (NEEDLE) IMPLANT
ELECT NEEDLE TIP 2.8 STRL (NEEDLE) IMPLANT
ELECT REM PT RETURN 15FT ADLT (MISCELLANEOUS) ×2 IMPLANT
GAUZE 4X4 16PLY ~~LOC~~+RFID DBL (SPONGE) IMPLANT
GAUZE PAD ABD 8X10 STRL (GAUZE/BANDAGES/DRESSINGS) ×1 IMPLANT
GAUZE SPONGE 4X4 12PLY STRL (GAUZE/BANDAGES/DRESSINGS) ×2 IMPLANT
GLOVE BIO SURGEON STRL SZ8.5 (GLOVE) ×2 IMPLANT
GLOVE BIOGEL PI IND STRL 8 (GLOVE) ×1 IMPLANT
GLOVE BIOGEL PI IND STRL 8.5 (GLOVE) ×1 IMPLANT
GLOVE BIOGEL PI INDICATOR 8 (GLOVE) ×1
GLOVE BIOGEL PI INDICATOR 8.5 (GLOVE) ×1
GLOVE ECLIPSE 8.0 STRL XLNG CF (GLOVE) ×4 IMPLANT
GOWN STRL REUS W/ TWL XL LVL3 (GOWN DISPOSABLE) IMPLANT
GOWN STRL REUS W/TWL 2XL LVL3 (GOWN DISPOSABLE) ×2 IMPLANT
GOWN STRL REUS W/TWL XL LVL3 (GOWN DISPOSABLE)
KIT BASIN OR (CUSTOM PROCEDURE TRAY) ×2 IMPLANT
KIT TURNOVER KIT A (KITS) IMPLANT
MANIFOLD NEPTUNE II (INSTRUMENTS) IMPLANT
NDL 1/2 CIR CATGUT .05X1.09 (NEEDLE) IMPLANT
NDL SCORPION MULTI FIRE (NEEDLE) IMPLANT
NDL SPNL 18GX3.5 QUINCKE PK (NEEDLE) ×1 IMPLANT
NEEDLE 1/2 CIR CATGUT .05X1.09 (NEEDLE) ×2 IMPLANT
NEEDLE SCORPION MULTI FIRE (NEEDLE) IMPLANT
NEEDLE SPNL 18GX3.5 QUINCKE PK (NEEDLE) ×2 IMPLANT
NS IRRIG 1000ML POUR BTL (IV SOLUTION) IMPLANT
PACK SHOULDER (CUSTOM PROCEDURE TRAY) ×2 IMPLANT
PORT APPOLLO RF 90DEGREE MULTI (SURGICAL WAND) ×3 IMPLANT
RESTRAINT HEAD UNIVERSAL NS (MISCELLANEOUS) ×2 IMPLANT
SLING ARM FOAM STRAP LRG (SOFTGOODS) IMPLANT
SLING ARM FOAM STRAP MED (SOFTGOODS) IMPLANT
SPIKE FLUID TRANSFER (MISCELLANEOUS) IMPLANT
SPONGE T-LAP 4X18 ~~LOC~~+RFID (SPONGE) ×4 IMPLANT
STAPLER VISISTAT 35W (STAPLE) IMPLANT
STRIP CLOSURE SKIN 1/2X4 (GAUZE/BANDAGES/DRESSINGS) IMPLANT
SUCTION FRAZIER HANDLE 10FR (MISCELLANEOUS)
SUCTION TUBE FRAZIER 10FR DISP (MISCELLANEOUS) IMPLANT
SUT ETHIBOND 0 (SUTURE) ×2 IMPLANT
SUT ETHILON 3 0 PS 1 (SUTURE) IMPLANT
SUT MNCRL AB 3-0 PS2 18 (SUTURE) ×2 IMPLANT
SUT VIC AB 0 CT2 27 (SUTURE) ×2 IMPLANT
SUT VIC AB 2-0 SH 27 (SUTURE) ×1
SUT VIC AB 2-0 SH 27XBRD (SUTURE) ×1 IMPLANT
TAPE PAPER 3X10 WHT MICROPORE (GAUZE/BANDAGES/DRESSINGS) ×1 IMPLANT
TAPE STRIPS DRAPE STRL (GAUZE/BANDAGES/DRESSINGS) ×1 IMPLANT
TOWEL SURG RFD BLUE STRL DISP (DISPOSABLE) ×2 IMPLANT
TUBING ARTHROSCOPY IRRIG 16FT (MISCELLANEOUS) ×2 IMPLANT
WATER STERILE IRR 1000ML POUR (IV SOLUTION) ×2 IMPLANT
YANKAUER SUCT BULB TIP NO VENT (SUCTIONS) IMPLANT

## 2021-07-07 NOTE — Op Note (Signed)
NAME: Abigail Wright, JICHA ?MEDICAL RECORD NO: [id number] ?ACCOUNT NO: [id number] ?DATE OF BIRTH: 04-30-1965 ?FACILITY: [facility] ?LOCATION: [facility]PERIOP ?PHYSICIAN: Vonna Kotyk. Durward Fortes, MD ? ?Operative Report  ? ?DATE OF PROCEDURE: 07/07/2021 ? ?PREOPERATIVE DIAGNOSES: Impingement syndrome, right shoulder, degenerative arthritis, acromioclavicular joint and rotator cuff tear, primarily involving supraspinatus. ? ?POSTOPERATIVE DIAGNOSIS: Impingement syndrome, right shoulder, degenerative arthritis, acromioclavicular joint and rotator cuff tear, primarily involving supraspinatus with tear of biceps tendon. ? ?PROCEDURES:   ?1.  Diagnostic arthroscopy, right shoulder with debridement of labral fraying and synovitis. ?2.  Release of biceps tendon. ?3.  Arthroscopic subacromial decompression. ?4. Arthroscopic distal clavicle resection. ?5.  Mini open rotator cuff tear repair and biceps tenodesis. ? ?SURGEON:  Vonna Kotyk. Durward Fortes, MD. ? ?ASSISTANT:  Biagio Borg, PA-C, was present throughout the operative procedure to ensure its timely completion.  He was present from the beginning to the end and provided retraction, clinical advice and wound closure and dressing application.   ?Anesthesia with supra scalene nerve block. ? ?COMPLICATIONS:  None. ? ?INDICATIONS:  A 56 year old female who sustained an injury to her right shoulder after a fall in December of 2022.  She has had continued problems with overhead activity and sleeping despite subacromial cortisone injections and exercises.  She has had a  ?recent MRI scan demonstrating a tear of the supraspinatus.  There was some tendinosis of the biceps tendon and maybe a partial tear of the subscapularis.  There were moderate degenerative changes at the [facility] joint and a thickened acromion.  She is now to  ?have an arthroscopic procedure and mini open rotator cuff tear repair. ? ?DESCRIPTION OF PROCEDURE:  The patient was met in the holding area, identified the right shoulder as the appropriate  operative site and marked it accordingly.  Anesthesia performed an interscalene nerve block. ? ?The patient was then transported to room #4.  She was carefully placed on the operating table and then placed under general anesthesia without difficulty.  With a shoulder frame she was placed in the semi-sitting position.  I examined the right shoulder  ?under anesthesia without evidence of instability or adhesive capsulitis. ? ?The right shoulder was then prepped with DuraPrep and the base of the neck circumferentially to the wrist.  Sterile draping was performed. ? ?A timeout was called. ? ?Marking pen was used to outline the [facility] joint, the coracoid and the acromion.  At a point of fingerbreadth posterior and medial to the posterior angle acromion, a small stab wound was made.  I did infiltrate 0.25% Marcaine with epinephrine into the  ?puncture sites in the subacromial space.  The arthroscope was easily placed into the shoulder joint. ? ?Diagnostic arthroscopy revealed an obvious tear of the supraspinatus.  I could easily visualize the subacromial space.  There were areas of synovitis within the joint, but no evidence of chondromalacia of either the humeral head or the glenoid.  No loose ? bodies.  The biceps tendon was at least 50% torn and quite thin.  A second portal was then established with a ribbed cannula anteriorly.  I debrided the synovitis and any fraying of the labrum.  I then released the biceps tendon.  Subscapularis appeared ? to be intact. ? ?Arthroscope was then placed in the subacromial space posteriorly, the cannula in the subacromial space anteriorly and a third portal was established in the lateral subacromial space.  An arthroscopic subacromial decompression was performed.  There was a  ?moderate amount of inflammatory bursal tissue that was resected and some areas  of synovitis along the area of the rotator cuff tear.  There also bulky degenerative changes at the [facility] joint with inferiorly directed  osteophytes from both the distal clavicle ? and acromion.  Using a 6 mm bur, I performed an anterior acromioplasty.  There did not appear to be any lateral impingement.  Distal clavicle resection was also performed using the same 6 mm bur with a nice flat resection.  Any bleeding was controlled  ?with the Arthrocare wand. ? ?The arthroscopic instruments were then removed.  I cleaned the operative site and then applied a second pair of gloves.  Mini open rotator cuff tear repair and biceps tenodesis was performed using about an inch and a half incision along the anterior  ?aspect of his shoulder.  Via sharp dissection, incision was carried down to subcutaneous tissue.  Small bleeders were Bovie coagulated.  A raphae in the deltoid fascia was identified and incised.  A self-retaining retractor was inserted.  The subacromial ? space was identified.  There was some further bursal tissue anteriorly, which I resected.  The biceps groove was identified and made an incision through the retinaculum with the Bovie.  I easily retrieved the biceps tendon and under normal tension  ?tenodesed it to the inferior groove with a 5.5 mm PEEK anchor attached #2 FiberWire. ? ?Retinaculum was closed with a running 0 Vicryl. ? ?An [employer] retractor was then placed beneath the acromion.  With traction on the arm, I could visualize the supraspinatus tendon tear was vertical in the orientation.  I debrided the edges and then sewed them side-to-side with a running 0 Ethibond  ?suture.  I then did a second row repair with a running 0 Vicryl.  As I placed the arm through range of motion, there did not appear to be any separation. ? ?The wound was irrigated with saline solution.  By finger palpation, I thought I had nice subacromial decompression. ? ?The wound was then closed in several layers.  The deltoid fascia was closed with a running 0 Vicryl, the subQ with 3-0 Monocryl and the superficial wounds closed with Steri-Strips over benzoin.   Sterile bulky dressing was applied followed by a sling. ? ?PLAN:  Office 1 week, oxycodone for pain. ? ? ?PUS ?D: 07/07/2021 9:17:56 am T: 07/07/2021 2:18:00 pm  ?JOB: [id number]/ [id number]  ?

## 2021-07-07 NOTE — Op Note (Signed)
PATIENT ID:      Mikaelyn Penaflor  ?MRN:     [id number] ?DOB/AGE:    [birth year] / 54 y.o. ? ? ?    OPERATIVE REPORT  ? ? DATE OF PROCEDURE:  07/07/2021   ?   ? PREOPERATIVE DIAGNOSIS:   RIGHT SHOULDER IMPINGEMENT SYNDROME, ROTATOR CUFF TEAR,  AC JOINT ARTHRITIS ?                                                      Estimated body mass index is 36.46 kg/m? as calculated from the following: ?  Height as of this encounter: 5\' 8"  (1.727 m). ?  Weight as of this encounter: 108.8 kg. ?   ? POSTOPERATIVE DIAGNOSIS: SAME WITH TEAR LONG HEAD BICEPS                                                                     Estimated body mass index is 36.46 kg/m? as calculated from the following: ?  Height as of this encounter: 5\' 8"  (1.727 m). ?  Weight as of this encounter: 108.8 kg.   ? ? PROCEDURE:  Procedure(s): ?RIGHT SHOULDER ARTHROSCOPY WITH SUBACROMIAL DECOMPRESSION ,DISTAL CLAVICLE RESECTION /BICEPS TENODESIS AND ROTATOR CUFF TEAR ?   ? SURGEON:  Joni Fears, MD  ? ? ASSISTANT:   Biagio Borg, PA-C   (Present and scrubbed throughout the case, critical for assistance with exposure, retraction, instrumentation, and closure.)      ?   ? ANESTHESIA: regional and general ?   ? DRAINS: none :    ? ? TOURNIQUET TIME: * No tourniquets in log *  ? ? COMPLICATIONS:  None  ? ?CONDITION:  stable ? ?PROCEDURE IN DETAIL: [id number] ? ? ? ? ? ? ?Garald Balding ?07/07/2021, 9:08 AM ?  ?

## 2021-07-07 NOTE — Anesthesia Procedure Notes (Addendum)
Procedure Name: Intubation ?Date/Time: 07/07/2021 7:31 AM ?Performed by: Victoriano Lain, CRNA ?Pre-anesthesia Checklist: Patient identified, Emergency Drugs available, Suction available and Patient being monitored ?Patient Re-evaluated:Patient Re-evaluated prior to induction ?Oxygen Delivery Method: Circle system utilized ?Preoxygenation: Pre-oxygenation with 100% oxygen ?Induction Type: IV induction ?Ventilation: Mask ventilation without difficulty ?Laryngoscope Size: Mac and 3 ?Grade View: Grade I ?Tube type: Oral ?Tube size: 7.5 mm ?Number of attempts: 1 ?Airway Equipment and Method: Stylet and Oral airway ?Placement Confirmation: ETT inserted through vocal cords under direct vision, positive ETCO2 and breath sounds checked- equal and bilateral ?Tube secured with: Tape ?Dental Injury: Teeth and Oropharynx as per pre-operative assessment  ?Comments: DL x1 and intubation by Liane Comber, SRNA ? ? ? ? ?

## 2021-07-07 NOTE — Anesthesia Postprocedure Evaluation (Signed)
Anesthesia Post Note ? ?Patient: Abigail Wright ? ?Procedure(s) Performed: RIGHT SHOULDER ARTHROSCOPY WITH SUBACROMIAL DECOMPRESSION / ROTATOR CUFF REPAIR/ DISTAL CLAVICLE RESECTION /BICEPS TENODESIS (Right) ? ?  ? ?Patient location during evaluation: PACU ?Anesthesia Type: General ?Level of consciousness: awake and alert and oriented ?Pain management: pain level controlled ?Vital Signs Assessment: post-procedure vital signs reviewed and stable ?Respiratory status: spontaneous breathing, nonlabored ventilation and respiratory function stable ?Cardiovascular status: blood pressure returned to baseline and stable ?Postop Assessment: no apparent nausea or vomiting ?Anesthetic complications: no ? ? ?No notable events documented. ? ?Last Vitals:  ?Vitals:  ? 07/07/21 1000 07/07/21 1015  ?BP: (!) 148/92 131/90  ?Pulse: 87 80  ?Resp: 19 20  ?Temp:  36.4 ?C  ?SpO2: 92% 94%  ?  ?Last Pain:  ?Vitals:  ? 07/07/21 1015  ?TempSrc:   ?PainSc: 3   ? ? ?  ?  ?  ?  ?  ?  ? ?Lastacia Solum A. ? ? ? ? ?

## 2021-07-07 NOTE — H&P (Signed)
The recent History & Physical has been reviewed. ?I have personally examined the patient today. ?There is no interval change to the documented History & Physical. ?The patient would like to proceed with the procedure. ? ?Valeria Batman ?07/07/2021,  7:17 AM ?  ? ?

## 2021-07-07 NOTE — Anesthesia Procedure Notes (Signed)
Anesthesia Regional Block: Interscalene brachial plexus block  ? ?Pre-Anesthetic Checklist: , timeout performed,  Correct Patient, Correct Site, Correct Laterality,  Correct Procedure, Correct Position, site marked,  Risks and benefits discussed,  Surgical consent,  Pre-op evaluation,  At surgeon's request and post-op pain management ? ?Laterality: Right ? ?Prep: chloraprep     ?  ?Needles:  ?Injection technique: Single-shot ? ?Needle Type: Echogenic Stimulator Needle   ? ? ?Needle Length: 10cm  ?Needle Gauge: 21  ? ?Needle insertion depth: 7 cm ? ? ?Additional Needles: ? ? ?Procedures:,,,, ultrasound used (permanent image in chart),,    ?Narrative:  ?Start time: 07/07/2021 7:10 AM ?End time: 07/07/2021 7:15 AM ?Injection made incrementally with aspirations every 5 mL. ? ?Performed by: Personally  ?Anesthesiologist: Mal Amabile, MD ? ?Additional Notes: ?Timeout performed. Patient sedated. Relevant anatomy ID'd using [place]. Incremental 2-6ml injection of LA with frequent aspiration. Patient tolerated procedure well. ? ? ? ?Right Interscalene Block ? ?

## 2021-07-07 NOTE — Transfer of Care (Signed)
Immediate Anesthesia Transfer of Care Note ? ?Patient: Abigail Wright ? ?Procedure(s) Performed: RIGHT SHOULDER ARTHROSCOPY WITH SUBACROMIAL DECOMPRESSION / ROTATOR CUFF REPAIR/ DISTAL CLAVICLE RESECTION /BICEPS TENODESIS (Right) ? ?Patient Location: PACU ? ?Anesthesia Type:General ? ?Level of Consciousness: awake, alert , oriented and patient cooperative ? ?Airway & Oxygen Therapy: Patient Spontanous Breathing and Patient connected to face mask oxygen ? ?Post-op Assessment: Report given to RN and Post -op Vital signs reviewed and stable ? ?Post vital signs: Reviewed and stable ? ?Last Vitals:  ?Vitals Value Taken Time  ?BP 157/77 07/07/21 0930  ?Temp 36.2 ?C 07/07/21 0929  ?Pulse 95 07/07/21 0931  ?Resp 18 07/07/21 0931  ?SpO2 100 % 07/07/21 0931  ?Vitals shown include unvalidated device data. ? ?Last Pain:  ?Vitals:  ? 07/07/21 0929  ?TempSrc:   ?PainSc: Asleep  ?   ? ?Patients Stated Pain Goal: 4 (07/07/21 0553) ? ?Complications: No notable events documented. ?

## 2021-07-08 ENCOUNTER — Telehealth: Payer: Self-pay | Admitting: Orthopaedic Surgery

## 2021-07-08 NOTE — Telephone Encounter (Signed)
Pt's daughter [place]. Please call pt's or spouse Minerva Areola  at [phone]. They have medical question about pt's surgery she had yesterday. Pt is asking for Lauren G to call to explain her procedure and when will she start physical therapy. Please call either above phone number . ?

## 2021-07-08 NOTE — Telephone Encounter (Signed)
called

## 2021-07-09 ENCOUNTER — Telehealth (HOSPITAL_BASED_OUTPATIENT_CLINIC_OR_DEPARTMENT_OTHER): Payer: Self-pay

## 2021-07-10 ENCOUNTER — Encounter (HOSPITAL_COMMUNITY): Payer: Self-pay | Admitting: Orthopaedic Surgery

## 2021-07-14 ENCOUNTER — Encounter: Payer: Self-pay | Admitting: Orthopaedic Surgery

## 2021-07-14 ENCOUNTER — Ambulatory Visit (INDEPENDENT_AMBULATORY_CARE_PROVIDER_SITE_OTHER): Payer: No Typology Code available for payment source | Admitting: Orthopaedic Surgery

## 2021-07-14 DIAGNOSIS — M7541 Impingement syndrome of right shoulder: Secondary | ICD-10-CM

## 2021-07-14 DIAGNOSIS — S46011D Strain of muscle(s) and tendon(s) of the rotator cuff of right shoulder, subsequent encounter: Secondary | ICD-10-CM

## 2021-07-14 DIAGNOSIS — M75121 Complete rotator cuff tear or rupture of right shoulder, not specified as traumatic: Secondary | ICD-10-CM | POA: Insufficient documentation

## 2021-07-14 NOTE — Progress Notes (Signed)
Office Visit Note   Patient: Abigail Wright           Date of Birth: 08-19-1965           MRN: [id number] Visit Date: 07/14/2021              Requested by: Sharlene Dory, DO [address] [place],  [place] [address] PCP: Sharlene Dory, DO   Assessment & Plan: Visit Diagnoses:  1. Impingement syndrome of right shoulder   2. Traumatic complete tear of right rotator cuff, subsequent encounter     Plan: 1 week status post arthroscopic SCD, DCR and mini open rotator cuff tear repair with biceps tenodesis.  Doing well.  The interscalene nerve block lasted about 48 hours.  Taking occasional Percocet with Advil.  Wounds are healing without problem.  Steri-Strips applied over benzoin.  We will [facility] at [facility] and return in 3 weeks to the office.  Continue with sling  Follow-Up Instructions: Return in about 3 weeks (around 08/04/2021).   Orders:  No orders of the defined types were placed in this encounter.  No orders of the defined types were placed in this encounter.     Procedures: No procedures performed   Clinical Data: No additional findings.   Subjective: Chief Complaint  Patient presents with   Right Shoulder - Follow-up    Right shoulder arthroscopy 07/07/2021  Patient presents today for follow up on her right shoulder. She had a right shoulder arthroscopy on 07/07/2021. She is now 1 week out from surgery. She is doing well.  HPI  Review of Systems   Objective: Vital Signs: There were no vitals taken for this visit.  Physical Exam  Ortho Exam alert and oriented x3.  Comfortable sitting and in no acute distress.  Dressing was removed.  Wounds are clean and dry without evidence of infection.  Good grip and release.  Neurologically intact.  Motion not attempted  Specialty Comments:  No specialty comments available.  Imaging: No results found.   PMFS History: Patient Active Problem List    Diagnosis Date Noted   Complete tear of right rotator cuff 07/14/2021   Impingement syndrome of right shoulder 06/17/2021   Major depressive disorder, recurrent episode, moderate (HCC) 03/06/2021   Pain in left knee 11/04/2017   Benign paroxysmal positional vertigo of left ear 05/15/2015   Family history of breast cancer in mother 08/02/2011   Past Medical History:  Diagnosis Date   ADD (attention deficit disorder)    Anxiety    Arthritis    Blood transfusion without reported diagnosis    1993   Depression    GERD (gastroesophageal reflux disease)    in past but recently controlled with diet and exercise.   Hyperlipidemia     Family History  Problem Relation Age of Onset   Cancer Mother    Diabetes Father    Heart disease Paternal Uncle    Hypertension Paternal Uncle    Colon cancer Neg Hx    Esophageal cancer Neg Hx    Rectal cancer Neg Hx    Stomach cancer Neg Hx     Past Surgical History:  Procedure Laterality Date   ABDOMINAL HYSTERECTOMY     BUNIONECTOMY Left    CARPAL TUNNEL RELEASE Bilateral    cyst removal from left hand Left    FOOT SURGERY     KNEE CARTILAGE SURGERY     planters fascitis  SHOULDER ARTHROSCOPY WITH SUBACROMIAL DECOMPRESSION AND OPEN ROTATOR C Right 07/07/2021   Procedure: RIGHT SHOULDER ARTHROSCOPY WITH SUBACROMIAL DECOMPRESSION / ROTATOR CUFF REPAIR/ DISTAL CLAVICLE RESECTION /BICEPS TENODESIS;  Surgeon: Valeria Batman, MD;  Location: [facility];  Service: Orthopedics;  Laterality: Right;   TENDON REPAIR Left 03/21/2017   Left hand    TONSILLECTOMY     Social History   Occupational History   Not on file  Tobacco Use   Smoking status: Former   Smokeless tobacco: Never  Vaping Use   Vaping Use: Never used  Substance and Sexual Activity   Alcohol use: Never   Drug use: No   Sexual activity: Yes    Partners: Male    Birth control/protection: None, Post-menopausal

## 2021-07-15 ENCOUNTER — Telehealth (HOSPITAL_BASED_OUTPATIENT_CLINIC_OR_DEPARTMENT_OTHER): Payer: Self-pay

## 2021-07-16 ENCOUNTER — Ambulatory Visit: Payer: No Typology Code available for payment source | Attending: Orthopaedic Surgery | Admitting: Physical Therapy

## 2021-07-16 DIAGNOSIS — M25511 Pain in right shoulder: Secondary | ICD-10-CM | POA: Insufficient documentation

## 2021-07-16 DIAGNOSIS — S46011D Strain of muscle(s) and tendon(s) of the rotator cuff of right shoulder, subsequent encounter: Secondary | ICD-10-CM | POA: Diagnosis not present

## 2021-07-16 DIAGNOSIS — R293 Abnormal posture: Secondary | ICD-10-CM | POA: Diagnosis not present

## 2021-07-16 DIAGNOSIS — M6281 Muscle weakness (generalized): Secondary | ICD-10-CM | POA: Insufficient documentation

## 2021-07-16 NOTE — Therapy (Signed)
[facility] [address] [place], [place], [address] Phone: [phone]   Fax:  [phone]  Physical Therapy Evaluation  Patient Details  Name: Abigail NightingaleRischard Bekker MRN: [id number] Date of Birth: Jan 02, 1966 Referring Provider (PT): Cleophas DunkerWhitfield   Encounter Date: 07/16/2021 Rationale for Evaluation and Treatment Rehabilitation   PT End of Session - 07/16/21 0919     Visit Number 1    Number of Visits 12    Date for PT Re-Evaluation 08/27/21    PT Start Time 0845    PT Stop Time 0919    PT Time Calculation (min) 34 min    Activity Tolerance Patient tolerated treatment well    Behavior During Therapy [facility] for tasks assessed/performed             Past Medical History:  Diagnosis Date   ADD (attention deficit disorder)    Anxiety    Arthritis    Blood transfusion without reported diagnosis    1993   Depression    GERD (gastroesophageal reflux disease)    in past but recently controlled with diet and exercise.   Hyperlipidemia     Past Surgical History:  Procedure Laterality Date   ABDOMINAL HYSTERECTOMY     BUNIONECTOMY Left    CARPAL TUNNEL RELEASE Bilateral    cyst removal from left hand Left    FOOT SURGERY     KNEE CARTILAGE SURGERY     planters fascitis     SHOULDER ARTHROSCOPY WITH SUBACROMIAL DECOMPRESSION AND OPEN ROTATOR C Right 07/07/2021   Procedure: RIGHT SHOULDER ARTHROSCOPY WITH SUBACROMIAL DECOMPRESSION / ROTATOR CUFF REPAIR/ DISTAL CLAVICLE RESECTION /BICEPS TENODESIS;  Surgeon: Valeria BatmanWhitfield, Peter W, MD;  Location: [facility];  Service: Orthopedics;  Laterality: Right;   TENDON REPAIR Left 03/21/2017   Left hand    TONSILLECTOMY      There were no vitals filed for this visit.    Subjective Assessment - 07/16/21 0840     Subjective Pt had a fall 12/22 and initially had xrays which showed no fractures. 3/23 she went to MD who discovered full supraspinatus tear as well as biceps tearing. Pt underwent Rt  RTC repair with biceps tenodesis 07/07/21. Pt is currently wearing sling at all times.    Patient Stated Goals get use of my arm back    Currently in Pain? Yes    Pain Score 4     Pain Location Shoulder    Pain Orientation Right    Pain Descriptors / Indicators Throbbing;Sore    Pain Type Surgical pain    Pain Onset 1 to 4 weeks ago    Pain Frequency Constant    Aggravating Factors  accidental overuse    Pain Relieving Factors ice, meds                [facility] PT Assessment - 07/16/21 0001       Assessment   Medical Diagnosis Rt RTC repair with biceps tenodesis    Referring Provider (PT) Whitfield    Onset Date/Surgical Date 07/07/21    Hand Dominance Right    Next MD Visit 08/05/21    Prior Therapy no      Precautions   Precaution Comments per protocol      Balance Screen   Has the patient fallen in the past 6 months Yes    How many times? 1    Has the patient had a decrease in activity level because of a fear of falling?  No  Is the patient reluctant to leave their home because of a fear of falling?  No      Prior Function   Level of Independence Independent    Vocation Requirements watches granddaughter every day      Observation/Other Assessments   Focus on Therapeutic Outcomes (FOTO)  not performed for this patient      ROM / Strength   AROM / PROM / Strength AROM;Strength;PROM      PROM   PROM Assessment Site Shoulder    Right/Left Shoulder Right    Right Shoulder Flexion 82 Degrees    Right Shoulder ABduction 60 Degrees   limited by protocol   Right Shoulder External Rotation 20 Degrees      Strength   Overall Strength Comments not assessed due to protocol                        Objective measurements completed on examination: See above findings.       [facility] Adult PT Treatment/Exercise - 07/16/21 0001       Exercises   Exercises Shoulder      Shoulder Exercises: Seated   Other Seated Exercises scap squeeze x 10       Shoulder Exercises: ROM/Strengthening   Pendulum all directions x 10                     PT Education - 07/16/21 0909     Education Details PT POC and goals, HEP    Person(s) Educated Patient    Methods Explanation;Demonstration;Handout    Comprehension Returned demonstration;Verbalized understanding                 PT Long Term Goals - 07/16/21 0922       PT LONG TERM GOAL #1   Title Pt will be independent in HEP    Time 6    Period Weeks    Status New    Target Date 08/27/21      PT LONG TERM GOAL #2   Title Pt will improve Rt shoulder ROM to 140 flexion and 40 ER as allowed per protocol    Time 6    Period Weeks    Status New    Target Date 08/27/21      PT LONG TERM GOAL #3   Title Pt will improve Rt shoulder strength to 3/5 as allowed by protocol    Time 6    Period Weeks    Status New    Target Date 08/27/21                    Plan - 07/16/21 0920     Clinical Impression Statement Pt is a 56 y/o female s/p Rt shoulder arthroscopy RTC repair with biceps tenodesis. She presents with decreased strength and ROM, increased pain, impaired postuare and decreased functional mobility. Pt will benefit from skilled PT to address deficits and improve functional mobility and independence    Personal Factors and Comorbidities Time since onset of injury/illness/exacerbation    Examination-Activity Limitations Dressing;Carry;Caring for Others;Reach Overhead;Lift;Hygiene/Grooming    Examination-Participation Restrictions Driving;Community Activity;Cleaning;Meal Prep;Laundry    Stability/Clinical Decision Making Stable/Uncomplicated    Clinical Decision Making Low    Rehab Potential Good    PT Frequency 2x / week    PT Duration 6 weeks    PT Treatment/Interventions Aquatic Therapy;Cryotherapy;Electrical Stimulation;Iontophoresis 4mg /ml Dexamethasone;Moist Heat;Balance training;Neuromuscular re-education;Therapeutic exercise;Therapeutic  activities;Patient/family education;Manual techniques;Passive range of motion;Dry needling;Taping;Vasopneumatic  Device    PT Next Visit Plan assess HEP, PROM per protocol    PT Home Exercise Plan [email or web address]    Consulted and Agree with Plan of Care Patient             Patient will benefit from skilled therapeutic intervention in order to improve the following deficits and impairments:  Pain, Impaired UE functional use, Decreased strength, Decreased activity tolerance, Decreased range of motion, Increased edema  Visit Diagnosis: Acute pain of right shoulder - Plan: PT plan of care cert/re-cert  Muscle weakness (generalized) - Plan: PT plan of care cert/re-cert  Abnormal posture - Plan: PT plan of care cert/re-cert     Problem List Patient Active Problem List   Diagnosis Date Noted   Complete tear of right rotator cuff 07/14/2021   Impingement syndrome of right shoulder 06/17/2021   Major depressive disorder, recurrent episode, moderate (HCC) 03/06/2021   Pain in left knee 11/04/2017   Benign paroxysmal positional vertigo of left ear 05/15/2015   Family history of breast cancer in mother 08/02/2011    Reggy Eye, PT 07/16/2021, 9:25 AM  [facility] [address] [place], [place], [address] Phone: [phone]   Fax:  [phone]  Name: Erdine Hulen MRN: [id number] Date of Birth: 1965-07-31

## 2021-07-16 NOTE — Patient Instructions (Signed)
Access Code: [email or web address] URL: [email or web address] Date: 07/16/2021 Prepared by: Reggy Eye  Exercises - Circular Shoulder Pendulum with Table Support  - 1 x daily - 7 x weekly - 3 sets - 10 reps - Flexion-Extension Shoulder Pendulum with Table Support  - 1 x daily - 7 x weekly - 3 sets - 10 reps - Horizontal Shoulder Pendulum with Table Support  - 1 x daily - 7 x weekly - 3 sets - 10 reps - Seated Scapular Retraction  - 1 x daily - 7 x weekly - 1 sets - 10 reps - 10 seconds hold - Standing Elbow Flexion Extension AROM  - 1 x daily - 7 x weekly - 2 sets - 10 reps

## 2021-07-18 ENCOUNTER — Ambulatory Visit (HOSPITAL_BASED_OUTPATIENT_CLINIC_OR_DEPARTMENT_OTHER): Payer: No Typology Code available for payment source

## 2021-07-22 ENCOUNTER — Ambulatory Visit: Payer: No Typology Code available for payment source | Admitting: Rehabilitative and Restorative Service Providers"

## 2021-07-22 ENCOUNTER — Encounter: Payer: Self-pay | Admitting: Rehabilitative and Restorative Service Providers"

## 2021-07-22 DIAGNOSIS — M6281 Muscle weakness (generalized): Secondary | ICD-10-CM

## 2021-07-22 DIAGNOSIS — M25511 Pain in right shoulder: Secondary | ICD-10-CM | POA: Diagnosis not present

## 2021-07-22 DIAGNOSIS — R293 Abnormal posture: Secondary | ICD-10-CM

## 2021-07-22 NOTE — Therapy (Signed)
[facility] [place] [address] [place] [address] [place], [place], [address] Phone: [phone]   Fax:  [phone]  Physical Therapy Treatment Rationale for Evaluation and Treatment Rehabilitation  Patient Details  Name: Abigail Wright MRN: [id number] Date of Birth: [birth year] Referring Provider (PT): Cleophas Dunker   Encounter Date: 07/22/2021   PT End of Session - 07/22/21 1102     Visit Number 2    Number of Visits 12    Date for PT Re-Evaluation 08/27/21    PT Start Time 1101    PT Stop Time 1145    PT Time Calculation (min) 44 min    Activity Tolerance Patient tolerated treatment well             Past Medical History:  Diagnosis Date   ADD (attention deficit disorder)    Anxiety    Arthritis    Blood transfusion without reported diagnosis    1993   Depression    GERD (gastroesophageal reflux disease)    in past but recently controlled with diet and exercise.   Hyperlipidemia     Past Surgical History:  Procedure Laterality Date   ABDOMINAL HYSTERECTOMY     BUNIONECTOMY Left    CARPAL TUNNEL RELEASE Bilateral    cyst removal from left hand Left    FOOT SURGERY     KNEE CARTILAGE SURGERY     planters fascitis     SHOULDER ARTHROSCOPY WITH SUBACROMIAL DECOMPRESSION AND OPEN ROTATOR C Right 07/07/2021   Procedure: RIGHT SHOULDER ARTHROSCOPY WITH SUBACROMIAL DECOMPRESSION / ROTATOR CUFF REPAIR/ DISTAL CLAVICLE RESECTION /BICEPS TENODESIS;  Surgeon: Valeria Batman, MD;  Location: [facility];  Service: Orthopedics;  Laterality: Right;   TENDON REPAIR Left 03/21/2017   Left hand    TONSILLECTOMY      There were no vitals filed for this visit.   Subjective Assessment - 07/22/21 1102     Subjective Patient reports that she has been working oon exercises on a daily basis with no problems. She continues to have post op pain. Pt had a fall 12/22 and initially had xrays which showed no fractures. 3/23 she went to MD who discovered  full supraspinatus tear as well as biceps tearing. Pt underwent Rt RTC repair with biceps tenodesis 07/07/21. Pt is currently wearing sling at all times.    Currently in Pain? Yes    Pain Score 3     Pain Location Shoulder    Pain Orientation Right    Pain Descriptors / Indicators Aching;Sore   sometimes shooting   Pain Type Surgical pain    Pain Onset 1 to 4 weeks ago    Pain Frequency Constant                [facility] PT Assessment - 07/22/21 0001       Assessment   Medical Diagnosis Rt RTC repair with biceps tenodesis    Referring Provider (PT) Whitfield    Onset Date/Surgical Date 07/07/21    Hand Dominance Right    Next MD Visit 08/05/21    Prior Therapy no      PROM   Right Shoulder Flexion 114 Degrees    Right Shoulder ABduction 60 Degrees   in scapular plane to protocol limit   Right Shoulder External Rotation 25 Degrees   in scapular plane                          [facility] Adult PT Treatment/Exercise -  07/22/21 0001       Self-Care   ADL's instruction in sitting with Rt UE supported on pillow at side - demon for pt and husband to rest UE out of the sling; discussed the position of UE in sling and purpose of sling      Shoulder Exercises: Supine   Other Supine Exercises scap squeeze      Shoulder Exercises: Standing   Other Standing Exercises scap squeeze with noodle 10 sec x 10 reps      Shoulder Exercises: ROM/Strengthening   Pendulum circle CW/CCW x 30 each      Manual Therapy   Edema Management retrograde massage Rt wrist/forearm/arm    Soft tissue mobilization soft tissue mobilization Rt pecs/deltoid/biceps areas    Myofascial Release anterior arm/biceps/deltoid area    Passive ROM Rt shoulder flexion; abduction and ER in scapular plane to tissue limits and within protocol guidelines                          PT Long Term Goals - 07/16/21 3009       PT LONG TERM GOAL #1   Title Pt will be independent in HEP    Time 6     Period Weeks    Status New    Target Date 08/27/21      PT LONG TERM GOAL #2   Title Pt will improve Rt shoulder ROM to 140 flexion and 40 ER as allowed per protocol    Time 6    Period Weeks    Status New    Target Date 08/27/21      PT LONG TERM GOAL #3   Title Pt will improve Rt shoulder strength to 3/5 as allowed by protocol    Time 6    Period Weeks    Status New    Target Date 08/27/21                    Patient will benefit from skilled therapeutic intervention in order to improve the following deficits and impairments:     Visit Diagnosis: Acute pain of right shoulder  Muscle weakness (generalized)  Abnormal posture     Problem List Patient Active Problem List   Diagnosis Date Noted   Complete tear of right rotator cuff 07/14/2021   Impingement syndrome of right shoulder 06/17/2021   Major depressive disorder, recurrent episode, moderate (HCC) 03/06/2021   Pain in left knee 11/04/2017   Benign paroxysmal positional vertigo of left ear 05/15/2015   Family history of breast cancer in mother 08/02/2011    Val Riles, PT, MPH  07/22/2021, 12:54 PM  [facility] [address] [place], [place], [address] Phone: [phone]   Fax:  [phone]  Name: Abigail Wright MRN: [id number] Date of Birth: Mar 21, 1965

## 2021-07-28 ENCOUNTER — Ambulatory Visit: Payer: No Typology Code available for payment source | Attending: Orthopaedic Surgery | Admitting: Physical Therapy

## 2021-07-28 DIAGNOSIS — M6281 Muscle weakness (generalized): Secondary | ICD-10-CM

## 2021-07-28 DIAGNOSIS — M25511 Pain in right shoulder: Secondary | ICD-10-CM | POA: Diagnosis present

## 2021-07-28 DIAGNOSIS — R293 Abnormal posture: Secondary | ICD-10-CM

## 2021-07-28 NOTE — Therapy (Signed)
[place], [place], [address] Phone: [phone]   Fax:  [phone]  Physical Therapy Treatment  Patient Details  Name: Abigail Wright MRN: [id number] Date of Birth: Aug 28, 1965 Referring Provider (PT): Durward Fortes   Encounter Date: 07/28/2021 Rationale for Evaluation and Treatment Rehabilitation   PT End of Session - 07/28/21 1214     Visit Number 3    Number of Visits 12    Date for PT Re-Evaluation 08/27/21    PT Start Time [id number]    PT Stop Time 1215    PT Time Calculation (min) 30 min    Behavior During Therapy [facility] for tasks assessed/performed             Past Medical History:  Diagnosis Date   ADD (attention deficit disorder)    Anxiety    Arthritis    Blood transfusion without reported diagnosis    1993   Depression    GERD (gastroesophageal reflux disease)    in past but recently controlled with diet and exercise.   Hyperlipidemia     Past Surgical History:  Procedure Laterality Date   ABDOMINAL HYSTERECTOMY     BUNIONECTOMY Left    CARPAL TUNNEL RELEASE Bilateral    cyst removal from left hand Left    FOOT SURGERY     KNEE CARTILAGE SURGERY     planters fascitis     SHOULDER ARTHROSCOPY WITH SUBACROMIAL DECOMPRESSION AND OPEN ROTATOR C Right 07/07/2021   Procedure: RIGHT SHOULDER ARTHROSCOPY WITH SUBACROMIAL DECOMPRESSION / ROTATOR CUFF REPAIR/ DISTAL CLAVICLE RESECTION /BICEPS TENODESIS;  Surgeon: Garald Balding, MD;  Location: [facility];  Service: Orthopedics;  Laterality: Right;   TENDON REPAIR Left 03/21/2017   Left hand    TONSILLECTOMY      There were no vitals filed for this visit.   Subjective Assessment - 07/28/21 1146     Subjective Pt states that her shoulder is "coming along". "it's more of just an annoyance"    Patient Stated Goals get use of my arm back    Currently in Pain? Yes    Pain Score 3     Pain Location Shoulder    Pain Orientation Right     Pain Descriptors / Indicators Aching;Sore    Pain Type Surgical pain                [facility] PT Assessment - 07/28/21 0001       Assessment   Medical Diagnosis Rt RTC repair with biceps tenodesis    Referring Provider (PT) Whitfield    Onset Date/Surgical Date 07/07/21    Hand Dominance Right    Next MD Visit 08/05/21    Prior Therapy no      PROM   Right Shoulder ABduction 60 Degrees   protocol limit                          [facility] Adult PT Treatment/Exercise - 07/28/21 0001       Shoulder Exercises: Standing   Other Standing Exercises scap squeeze x 20    Other Standing Exercises elbow flex/ext AROM x 10      Shoulder Exercises: ROM/Strengthening   Pendulum all directions x 20      Manual Therapy   Edema Management retrograde massage Rt wrist/forearm/arm    Passive ROM Rt shoulder flexion; abduction and ER in scapular plane to tissue limits and within protocol guidelines  PT Long Term Goals - 07/16/21 [id number]       PT LONG TERM GOAL #1   Title Pt will be independent in HEP    Time 6    Period Weeks    Status New    Target Date 08/27/21      PT LONG TERM GOAL #2   Title Pt will improve Rt shoulder ROM to 140 flexion and 40 ER as allowed per protocol    Time 6    Period Weeks    Status New    Target Date 08/27/21      PT LONG TERM GOAL #3   Title Pt will improve Rt shoulder strength to 3/5 as allowed by protocol    Time 6    Period Weeks    Status New    Target Date 08/27/21                   Plan - 07/28/21 1214     Clinical Impression Statement Pt contiues with edema Rt elbow/triceps area. Pt with good response to retrograde massage. Pt continues with good tolerance to PROM and gentle exercises per protocol    PT Next Visit Plan assess HEP, PROM per protocol    PT Home Exercise Plan [id number] and Agree with Plan of Care Patient             Patient will benefit from  skilled therapeutic intervention in order to improve the following deficits and impairments:     Visit Diagnosis: Acute pain of right shoulder  Muscle weakness (generalized)  Abnormal posture     Problem List Patient Active Problem List   Diagnosis Date Noted   Complete tear of right rotator cuff 07/14/2021   Impingement syndrome of right shoulder 06/17/2021   Major depressive disorder, recurrent episode, moderate ([place]) 03/06/2021   Pain in left knee 11/04/2017   Benign paroxysmal positional vertigo of left ear 05/15/2015   Family history of breast cancer in mother 08/02/2011    Isabelle Course, PT 07/28/2021, 12:16 PM  [place], [place], [address] Phone: [phone]   Fax:  [phone]  Name: Abigail Wright MRN: [id number] Date of Birth: 1965-06-09

## 2021-07-30 ENCOUNTER — Ambulatory Visit: Payer: No Typology Code available for payment source | Admitting: Physical Therapy

## 2021-07-30 ENCOUNTER — Encounter: Payer: Self-pay | Admitting: Physical Therapy

## 2021-07-30 DIAGNOSIS — M6281 Muscle weakness (generalized): Secondary | ICD-10-CM

## 2021-07-30 DIAGNOSIS — M25511 Pain in right shoulder: Secondary | ICD-10-CM

## 2021-07-30 DIAGNOSIS — R293 Abnormal posture: Secondary | ICD-10-CM

## 2021-07-30 NOTE — Patient Instructions (Signed)
Access Code: [email or web address] URL: [email or web address] Date: 07/30/2021 Prepared by: Raynelle Fanning  Exercises - Circular Shoulder Pendulum with Table Support  - 1 x daily - 7 x weekly - 3 sets - 10 reps - Flexion-Extension Shoulder Pendulum with Table Support  - 1 x daily - 7 x weekly - 3 sets - 10 reps - Horizontal Shoulder Pendulum with Table Support  - 1 x daily - 7 x weekly - 3 sets - 10 reps - Seated Scapular Retraction  - 1 x daily - 7 x weekly - 1 sets - 10 reps - 10 seconds hold - Standing Elbow Flexion Extension AROM  - 1 x daily - 7 x weekly - 2 sets - 10 reps  Patient Education - [place] Patient Instructions

## 2021-07-30 NOTE — Therapy (Signed)
[facility] [place] [address] [place], [place], [address] Phone: [phone]   Fax:  [phone]  Physical Therapy Treatment  Patient Details  Name: Abigail Wright MRN: [id number] Date of Birth: 1965-09-27 Referring Provider (PT): Cleophas Dunker   Encounter Date: 07/30/2021  Rationale for Evaluation and Treatment Rehabilitation    PT End of Session - 07/30/21 1106     Visit Number 4    Number of Visits 12    Date for PT Re-Evaluation 08/27/21    PT Start Time 1106    PT Stop Time 1155    PT Time Calculation (min) 49 min    Activity Tolerance Patient tolerated treatment well    Behavior During Therapy [facility] for tasks assessed/performed             Past Medical History:  Diagnosis Date   ADD (attention deficit disorder)    Anxiety    Arthritis    Blood transfusion without reported diagnosis    1993   Depression    GERD (gastroesophageal reflux disease)    in past but recently controlled with diet and exercise.   Hyperlipidemia     Past Surgical History:  Procedure Laterality Date   ABDOMINAL HYSTERECTOMY     BUNIONECTOMY Left    CARPAL TUNNEL RELEASE Bilateral    cyst removal from left hand Left    FOOT SURGERY     KNEE CARTILAGE SURGERY     planters fascitis     SHOULDER ARTHROSCOPY WITH SUBACROMIAL DECOMPRESSION AND OPEN ROTATOR C Right 07/07/2021   Procedure: RIGHT SHOULDER ARTHROSCOPY WITH SUBACROMIAL DECOMPRESSION / ROTATOR CUFF REPAIR/ DISTAL CLAVICLE RESECTION /BICEPS TENODESIS;  Surgeon: Valeria Batman, MD;  Location: [facility];  Service: Orthopedics;  Laterality: Right;   TENDON REPAIR Left 03/21/2017   Left hand    TONSILLECTOMY      There were no vitals filed for this visit.   Subjective Assessment - 07/30/21 1107     Subjective My shoulder is sore today. My elbow is really hurting.    Patient Stated Goals get use of my arm back    Currently in Pain? Yes    Pain Score 4     Pain Location  Shoulder    Pain Orientation Right    Pain Descriptors / Indicators Aching;Sore    Pain Type Surgical pain                               [facility] Adult PT Treatment/Exercise - 07/30/21 0001       Self-Care   ADL's discussed edema management lying supine with upper arm elevated and doing wrist and elbow AROM; Also discussed elbow bursitis and using support under forearm in sling to take pressure off elbow fat pad      Shoulder Exercises: Supine   Other Supine Exercises elbow flex/ext x 10 and wrist pumps x 10 in supine with support under upper arm for edema management      Shoulder Exercises: Standing   Other Standing Exercises scap squeeze x 20      Shoulder Exercises: ROM/Strengthening   Pendulum pendulums x 10      Modalities   Modalities Iontophoresis      Iontophoresis   Type of Iontophoresis Dexamethasone    Location R elbow    Dose 1.0 ml    Time 8 hour patch      Manual Therapy   Edema Management retrograde  massage Rt wrist/forearm/arm    Passive ROM Rt shoulder flexion; abduction and ER in scapular plane to tissue limits and within protocol guidelines                     PT Education - 07/30/21 1201     Education Details ionto education; edema mgmt; elbow pain mgmt (see self care)    Person(s) Educated Patient    Methods Explanation;Handout    Comprehension Verbalized understanding                 PT Long Term Goals - 07/16/21 0922       PT LONG TERM GOAL #1   Title Pt will be independent in HEP    Time 6    Period Weeks    Status New    Target Date 08/27/21      PT LONG TERM GOAL #2   Title Pt will improve Rt shoulder ROM to 140 flexion and 40 ER as allowed per protocol    Time 6    Period Weeks    Status New    Target Date 08/27/21      PT LONG TERM GOAL #3   Title Pt will improve Rt shoulder strength to 3/5 as allowed by protocol    Time 6    Period Weeks    Status New    Target Date 08/27/21                    Plan - 07/30/21 1208     Clinical Impression Statement Abigail Wright reports increased R elbow pain today and has visible swelling in the elbow fat pad. Trial of ionto to elbow administered and advice on decreasing pressure in the sling using padding given. PROM is increasing in R shoulder. Seeing MD 08/04/21    PT Frequency 2x / week    PT Duration 6 weeks    PT Treatment/Interventions Aquatic Therapy;Cryotherapy;Electrical Stimulation;Iontophoresis 4mg /ml Dexamethasone;Moist Heat;Balance training;Neuromuscular re-education;Therapeutic exercise;Therapeutic activities;Patient/family education;Manual techniques;Passive range of motion;Dry needling;Taping;Vasopneumatic Device    PT Next Visit Plan MD note for appt 08/04/21; assess ionto to elbow;  PROM per protocol    PT Home Exercise Plan [email or web address]    Consulted and Agree with Plan of Care Patient             Patient will benefit from skilled therapeutic intervention in order to improve the following deficits and impairments:  Pain, Impaired UE functional use, Decreased strength, Decreased activity tolerance, Decreased range of motion, Increased edema  Visit Diagnosis: Acute pain of right shoulder  Muscle weakness (generalized)  Abnormal posture     Problem List Patient Active Problem List   Diagnosis Date Noted   Complete tear of right rotator cuff 07/14/2021   Impingement syndrome of right shoulder 06/17/2021   Major depressive disorder, recurrent episode, moderate ([facility]) 03/06/2021   Pain in left knee 11/04/2017   Benign paroxysmal positional vertigo of left ear 05/15/2015   Family history of breast cancer in mother 08/02/2011   10/02/2011, PT 07/30/2021, 12:13 PM  [facility] [place] [address] [place], [place], [place] Phone: [phone]   Fax:  [phone]  Name: Abigail Wright MRN: [name] Date of Birth: [birth year]

## 2021-08-04 ENCOUNTER — Encounter: Payer: Self-pay | Admitting: Orthopaedic Surgery

## 2021-08-04 ENCOUNTER — Ambulatory Visit: Payer: No Typology Code available for payment source | Admitting: Physical Therapy

## 2021-08-04 ENCOUNTER — Ambulatory Visit (INDEPENDENT_AMBULATORY_CARE_PROVIDER_SITE_OTHER): Payer: No Typology Code available for payment source | Admitting: Orthopaedic Surgery

## 2021-08-04 DIAGNOSIS — M25511 Pain in right shoulder: Secondary | ICD-10-CM

## 2021-08-04 DIAGNOSIS — S46011D Strain of muscle(s) and tendon(s) of the rotator cuff of right shoulder, subsequent encounter: Secondary | ICD-10-CM

## 2021-08-04 DIAGNOSIS — M7021 Olecranon bursitis, right elbow: Secondary | ICD-10-CM | POA: Insufficient documentation

## 2021-08-04 DIAGNOSIS — M25561 Pain in right knee: Secondary | ICD-10-CM

## 2021-08-04 DIAGNOSIS — G8929 Other chronic pain: Secondary | ICD-10-CM

## 2021-08-04 DIAGNOSIS — M7541 Impingement syndrome of right shoulder: Secondary | ICD-10-CM | POA: Diagnosis not present

## 2021-08-04 DIAGNOSIS — M6281 Muscle weakness (generalized): Secondary | ICD-10-CM

## 2021-08-04 DIAGNOSIS — R293 Abnormal posture: Secondary | ICD-10-CM

## 2021-08-04 NOTE — Therapy (Signed)
[place], [place], [address] Phone: [phone]   Fax:  [phone]  Physical Therapy Treatment  Patient Details  Name: Abigail Wright MRN: [id number] Date of Birth: December 12, 1965 Referring Provider (PT): Durward Fortes   Encounter Date: 08/04/2021 Rationale for Evaluation and Treatment Rehabilitation   PT End of Session - 08/04/21 1131     Visit Number 5    Number of Visits 12    Date for PT Re-Evaluation 08/27/21    PT Start Time 1100    PT Stop Time 1131    PT Time Calculation (min) 31 min    Activity Tolerance Patient tolerated treatment well    Behavior During Therapy [facility] for tasks assessed/performed             Past Medical History:  Diagnosis Date   ADD (attention deficit disorder)    Anxiety    Arthritis    Blood transfusion without reported diagnosis    1993   Depression    GERD (gastroesophageal reflux disease)    in past but recently controlled with diet and exercise.   Hyperlipidemia     Past Surgical History:  Procedure Laterality Date   ABDOMINAL HYSTERECTOMY     BUNIONECTOMY Left    CARPAL TUNNEL RELEASE Bilateral    cyst removal from left hand Left    FOOT SURGERY     KNEE CARTILAGE SURGERY     planters fascitis     SHOULDER ARTHROSCOPY WITH SUBACROMIAL DECOMPRESSION AND OPEN ROTATOR C Right 07/07/2021   Procedure: RIGHT SHOULDER ARTHROSCOPY WITH SUBACROMIAL DECOMPRESSION / ROTATOR CUFF REPAIR/ DISTAL CLAVICLE RESECTION /BICEPS TENODESIS;  Surgeon: Garald Balding, MD;  Location: [facility];  Service: Orthopedics;  Laterality: Right;   TENDON REPAIR Left 03/21/2017   Left hand    TONSILLECTOMY      There were no vitals filed for this visit.   Subjective Assessment - 08/04/21 1108     Subjective Pt states her shoulder is "sore" but that her elbow still causes the most pain    Patient Stated Goals get use of my arm back    Currently in Pain? Yes    Pain Score 3      Pain Location Shoulder    Pain Orientation Right    Pain Descriptors / Indicators Aching;Sore                [facility] PT Assessment - 08/04/21 0001       Assessment   Medical Diagnosis Rt RTC repair with biceps tenodesis    Referring Provider (PT) Whitfield    Onset Date/Surgical Date 07/07/21    Hand Dominance Right    Next MD Visit 08/05/21    Prior Therapy no      PROM   Right Shoulder Flexion 125 Degrees    Right Shoulder ABduction 60 Degrees   per protocol   Right Shoulder External Rotation 20 Degrees                           [facility] Adult PT Treatment/Exercise - 08/04/21 0001       Shoulder Exercises: Standing   Other Standing Exercises scap squeeze x 20      Shoulder Exercises: ROM/Strengthening   Pendulum pendulums x 10      Manual Therapy   Edema Management retrograde massage Rt wrist/forearm/arm    Passive ROM Rt shoulder flexion; abduction and ER in scapular plane to tissue  limits and within protocol guidelines                          PT Long Term Goals - 07/16/21 [id number]       PT LONG TERM GOAL #1   Title Pt will be independent in HEP    Time 6    Period Weeks    Status New    Target Date 08/27/21      PT LONG TERM GOAL #2   Title Pt will improve Rt shoulder ROM to 140 flexion and 40 ER as allowed per protocol    Time 6    Period Weeks    Status New    Target Date 08/27/21      PT LONG TERM GOAL #3   Title Pt will improve Rt shoulder strength to 3/5 as allowed by protocol    Time 6    Period Weeks    Status New    Target Date 08/27/21                   Plan - 08/04/21 1131     Clinical Impression Statement Pt continues with biggest c/o being Rt elbow pain. Pt with visible swelling of elbow fat pad. no change with ionto patch. PROM is progressing well    PT Next Visit Plan per protocol    PT Home Exercise Plan [id number] and Agree with Plan of Care Patient             Patient  will benefit from skilled therapeutic intervention in order to improve the following deficits and impairments:     Visit Diagnosis: Acute pain of right shoulder  Muscle weakness (generalized)  Abnormal posture     Problem List Patient Active Problem List   Diagnosis Date Noted   Complete tear of right rotator cuff 07/14/2021   Impingement syndrome of right shoulder 06/17/2021   Major depressive disorder, recurrent episode, moderate ([place]) 03/06/2021   Pain in left knee 11/04/2017   Benign paroxysmal positional vertigo of left ear 05/15/2015   Family history of breast cancer in mother 08/02/2011    Isabelle Course, PT 08/04/2021, 11:35 AM  [facility] [place], [place], [address] Phone: [phone]   Fax:  [phone]  Name: Abigail Wright MRN: [id number] Date of Birth: October 13, 1965

## 2021-08-04 NOTE — Progress Notes (Signed)
Office Visit Note   Patient: Abigail Wright           Date of Birth: [birth year]           MRN: [id number] Visit Date: 08/04/2021              Requested by: Abigail Dory, DO [address] [place],  [place] [address] PCP: Abigail Dory, DO   Assessment & Plan: Visit Diagnoses:  1. Chronic pain of right knee   2. Traumatic complete tear of right rotator cuff, subsequent encounter   3. Impingement syndrome of right shoulder   4. Olecranon bursitis, right elbow     Plan: 3 weeks status post rotator cuff tear repair of right shoulder with an SCD DCR and biceps tenodesis.  Doing well with the sling and attending physical therapy.  Had about 90 degrees of abduction and about 100 degrees of passive flexion doing well.  Continue with physical therapy and with sling and reevaluate in 3 weeks.  About 2 months ago Abigail Wright fell injuring her right shoulder and also her right elbow and right knee.  She does have a small olecranon bursa of the right elbow that is probably only about a centimeter in diameter and not red.  Just instructed in the use of Voltaren gel and padding her sling.  Consider aspiration at some point in the future if it does not resolve.  Also having persistent trouble with her right knee with pain with inclines and also along the medial compartment.  We will obtain an MRI scan  Follow-Up Instructions: Return in about 3 weeks (around 08/25/2021).   Orders:  Orders Placed This Encounter  Procedures   MR Knee Right w/o contrast   No orders of the defined types were placed in this encounter.     Procedures: No procedures performed   Clinical Data: No additional findings.   Subjective: Chief Complaint  Patient presents with   Right Shoulder - Routine Post Op   Patient presents today s/p right shoulder scope with SAD, RCR, distal clavicle resection, and biceps tenodesis preformed on 07/07/2021. Patient states that she has been attending  physical therapy two times per week which she feels like has been helping with recovery. She states that she has been having a fluid pocket in her right elbow which is becoming tender to touch. At this time she is currently ambulating with a sling and using OTC ibuprofen for pain.  Review of Systems   Objective: Vital Signs: There were no vitals taken for this visit.  Physical Exam Constitutional:      Appearance: She is well-developed.  Eyes:     Pupils: Pupils are equal, round, and reactive to light.  Pulmonary:     Effort: Pulmonary effort is normal.  Skin:    General: Skin is warm and dry.  Neurological:     Mental Status: She is alert and oriented to person, place, and time.  Psychiatric:        Behavior: Behavior normal.     Ortho Exam right shoulder incisions healing without problem.  I can abduct about 90 degrees and flex 90 to 100 degrees in the sling.  Neurologically intact.  Does have a very small right olecranon bursa about a centimeter in diameter.  It is fluctuant but not red or ecchymotic.  Full range of motion of the elbow.  Still having some pain in her right knee.  Possibly very minimal effusion there is  positive patella crepitation but no pain with compression.  Little bit of medial joint pain but no evidence of instability.  Full extension flexed over 105 degrees.  No popliteal pain or calf discomfort  Specialty Comments:  No specialty comments available.  Imaging: No results found.   PMFS History: Patient Active Problem List   Diagnosis Date Noted   Pain in right knee 08/04/2021   Olecranon bursitis, right elbow 08/04/2021   Complete tear of right rotator cuff 07/14/2021   Impingement syndrome of right shoulder 06/17/2021   Major depressive disorder, recurrent episode, moderate ([facility]) 03/06/2021   Pain in left knee 11/04/2017   Benign paroxysmal positional vertigo of left ear 05/15/2015   Family history of breast cancer in mother 08/02/2011   Past  Medical History:  Diagnosis Date   ADD (attention deficit disorder)    Anxiety    Arthritis    Blood transfusion without reported diagnosis    1993   Depression    GERD (gastroesophageal reflux disease)    in past but recently controlled with diet and exercise.   Hyperlipidemia     Family History  Problem Relation Age of Onset   Cancer Mother    Diabetes Father    Heart disease Paternal Uncle    Hypertension Paternal Uncle    Colon cancer Neg Hx    Esophageal cancer Neg Hx    Rectal cancer Neg Hx    Stomach cancer Neg Hx     Past Surgical History:  Procedure Laterality Date   ABDOMINAL HYSTERECTOMY     BUNIONECTOMY Left    CARPAL TUNNEL RELEASE Bilateral    cyst removal from left hand Left    FOOT SURGERY     KNEE CARTILAGE SURGERY     planters fascitis     SHOULDER ARTHROSCOPY WITH SUBACROMIAL DECOMPRESSION AND OPEN ROTATOR C Right 07/07/2021   Procedure: RIGHT SHOULDER ARTHROSCOPY WITH SUBACROMIAL DECOMPRESSION / ROTATOR CUFF REPAIR/ DISTAL CLAVICLE RESECTION /BICEPS TENODESIS;  Surgeon: Valeria Batman, MD;  Location: [facility];  Service: Orthopedics;  Laterality: Right;   TENDON REPAIR Left 03/21/2017   Left hand    TONSILLECTOMY     Social History   Occupational History   Not on file  Tobacco Use   Smoking status: Former   Smokeless tobacco: Never  Vaping Use   Vaping Use: Never used  Substance and Sexual Activity   Alcohol use: Never   Drug use: No   Sexual activity: Yes    Partners: Male    Birth control/protection: None, Post-menopausal

## 2021-08-06 ENCOUNTER — Ambulatory Visit: Payer: No Typology Code available for payment source | Admitting: Physical Therapy

## 2021-08-06 ENCOUNTER — Encounter: Payer: Self-pay | Admitting: Physical Therapy

## 2021-08-06 DIAGNOSIS — R293 Abnormal posture: Secondary | ICD-10-CM

## 2021-08-06 DIAGNOSIS — M25511 Pain in right shoulder: Secondary | ICD-10-CM

## 2021-08-06 DIAGNOSIS — M6281 Muscle weakness (generalized): Secondary | ICD-10-CM

## 2021-08-06 NOTE — Therapy (Signed)
[facility] [place] [address] [place], [place], [address] Phone: [phone]   Fax:  [phone]  Physical Therapy Treatment  Patient Details  Name: Abigail Wright MRN: [id number] Date of Birth: [birth year] Referring Provider (PT): Cleophas Dunker   Encounter Date: 08/06/2021   PT End of Session - 08/06/21 1103     Visit Number 6    Number of Visits 12    Date for PT Re-Evaluation 08/27/21    PT Start Time 1103    PT Stop Time 1134    PT Time Calculation (min) 31 min    Activity Tolerance Patient tolerated treatment well    Behavior During Therapy [facility] for tasks assessed/performed             Past Medical History:  Diagnosis Date   ADD (attention deficit disorder)    Anxiety    Arthritis    Blood transfusion without reported diagnosis    1993   Depression    GERD (gastroesophageal reflux disease)    in past but recently controlled with diet and exercise.   Hyperlipidemia     Past Surgical History:  Procedure Laterality Date   ABDOMINAL HYSTERECTOMY     BUNIONECTOMY Left    CARPAL TUNNEL RELEASE Bilateral    cyst removal from left hand Left    FOOT SURGERY     KNEE CARTILAGE SURGERY     planters fascitis     SHOULDER ARTHROSCOPY WITH SUBACROMIAL DECOMPRESSION AND OPEN ROTATOR C Right 07/07/2021   Procedure: RIGHT SHOULDER ARTHROSCOPY WITH SUBACROMIAL DECOMPRESSION / ROTATOR CUFF REPAIR/ DISTAL CLAVICLE RESECTION /BICEPS TENODESIS;  Surgeon: Valeria Batman, MD;  Location: [facility];  Service: Orthopedics;  Laterality: Right;   TENDON REPAIR Left 03/21/2017   Left hand    TONSILLECTOMY      There were no vitals filed for this visit.   Subjective Assessment - 08/06/21 1103     Subjective Patient still having elbow pain but okay right now    Patient Stated Goals get use of my arm back    Currently in Pain? No/denies                               [facility] Adult PT Treatment/Exercise - 08/06/21  0001       Shoulder Exercises: Seated   Retraction 10 reps      Manual Therapy   Manual Therapy Passive ROM;Taping    Passive ROM Rt shoulder flexion; abduction and ER in scapular plane to tissue limits and within protocol guidelines    Kinesiotex Edema      Kinesiotix   Edema 2 spider strips; one upper arm; one from elbow down                     PT Education - 08/06/21 1142     Education Details discussed sleeping position; KT tape education    Person(s) Educated Patient    Methods Explanation;Handout;Demonstration    Comprehension Verbalized understanding                 PT Long Term Goals - 07/16/21 0922       PT LONG TERM GOAL #1   Title Pt will be independent in HEP    Time 6    Period Weeks    Status New    Target Date 08/27/21      PT LONG TERM GOAL #2  Title Pt will improve Rt shoulder ROM to 140 flexion and 40 ER as allowed per protocol    Time 6    Period Weeks    Status New    Target Date 08/27/21      PT LONG TERM GOAL #3   Title Pt will improve Rt shoulder strength to 3/5 as allowed by protocol    Time 6    Period Weeks    Status New    Target Date 08/27/21                   Plan - 08/06/21 1143     Clinical Impression Statement Pt saw MD and he is pleased with her status. She continues to have elbow soreness and swelling in the elbow and R UE. Initial trial of KT tape today to address this.    PT Frequency 2x / week    PT Duration 6 weeks    PT Treatment/Interventions Aquatic Therapy;Cryotherapy;Electrical Stimulation;Iontophoresis 4mg /ml Dexamethasone;Moist Heat;Balance training;Neuromuscular re-education;Therapeutic exercise;Therapeutic activities;Patient/family education;Manual techniques;Passive range of motion;Dry needling;Taping;Vasopneumatic Device    PT Next Visit Plan per protocol    PT Home Exercise Plan [email or web address]             Patient will benefit from skilled therapeutic intervention in order to  improve the following deficits and impairments:  Pain, Impaired UE functional use, Decreased strength, Decreased activity tolerance, Decreased range of motion, Increased edema  Visit Diagnosis: Acute pain of right shoulder  Muscle weakness (generalized)  Abnormal posture     Problem List Patient Active Problem List   Diagnosis Date Noted   Pain in right knee 08/04/2021   Olecranon bursitis, right elbow 08/04/2021   Complete tear of right rotator cuff 07/14/2021   Impingement syndrome of right shoulder 06/17/2021   Major depressive disorder, recurrent episode, moderate (HCC) 03/06/2021   Pain in left knee 11/04/2017   Benign paroxysmal positional vertigo of left ear 05/15/2015   Family history of breast cancer in mother 08/02/2011    10/02/2011, PT 08/06/2021, 11:44 AM  [facility] [place] [address]  [address] [place], [place], [place] Phone: [phone]   Fax:  [phone]  Name: Abigail Wright MRN: [name] Date of Birth: [birth year]

## 2021-08-06 NOTE — Patient Instructions (Signed)
Access Code: [email or web address] URL: [email or web address] Date: 08/06/2021 Prepared by: Raynelle Fanning  Exercises - Supine Bridge  - 2 x daily - 7 x weekly - 1-3 sets - 10 reps - Supine Posterior Pelvic Tilt  - 2 x daily - 7 x weekly - 1 sets - 10 reps - 5 sec hold - Supine March  - 2 x daily - 7 x weekly - 2 sets - 10 reps - Supine Quadriceps Stretch with Strap on Table  - 2 x daily - 7 x weekly - 1 sets - 3 reps - 30-60 sec hold  Patient Education - Kinesiology tape

## 2021-08-11 ENCOUNTER — Encounter: Payer: No Typology Code available for payment source | Admitting: Rehabilitative and Restorative Service Providers"

## 2021-08-13 ENCOUNTER — Encounter: Payer: No Typology Code available for payment source | Admitting: Physical Therapy

## 2021-08-18 ENCOUNTER — Ambulatory Visit: Payer: No Typology Code available for payment source | Admitting: Physical Therapy

## 2021-08-18 ENCOUNTER — Other Ambulatory Visit: Payer: Self-pay | Admitting: Adult Health

## 2021-08-18 DIAGNOSIS — M25511 Pain in right shoulder: Secondary | ICD-10-CM | POA: Diagnosis not present

## 2021-08-18 DIAGNOSIS — R293 Abnormal posture: Secondary | ICD-10-CM

## 2021-08-18 DIAGNOSIS — M6281 Muscle weakness (generalized): Secondary | ICD-10-CM

## 2021-08-19 ENCOUNTER — Ambulatory Visit (HOSPITAL_BASED_OUTPATIENT_CLINIC_OR_DEPARTMENT_OTHER)
Admission: RE | Admit: 2021-08-19 | Discharge: 2021-08-19 | Disposition: A | Discharge status: Home / Self Care | Payer: No Typology Code available for payment source | Source: Ambulatory Visit | Attending: Orthopaedic Surgery | Admitting: Orthopaedic Surgery

## 2021-08-19 DIAGNOSIS — G8929 Other chronic pain: Secondary | ICD-10-CM | POA: Insufficient documentation

## 2021-08-19 DIAGNOSIS — M25561 Pain in right knee: Secondary | ICD-10-CM | POA: Diagnosis present

## 2021-08-20 ENCOUNTER — Encounter: Payer: Self-pay | Admitting: Physical Therapy

## 2021-08-20 ENCOUNTER — Ambulatory Visit: Payer: No Typology Code available for payment source | Admitting: Physical Therapy

## 2021-08-20 DIAGNOSIS — M25511 Pain in right shoulder: Secondary | ICD-10-CM | POA: Diagnosis not present

## 2021-08-20 DIAGNOSIS — M6281 Muscle weakness (generalized): Secondary | ICD-10-CM

## 2021-08-20 DIAGNOSIS — R293 Abnormal posture: Secondary | ICD-10-CM

## 2021-08-20 NOTE — Therapy (Signed)
[facility] [place] [address] [place] [address] [place], [place], [address] Phone: [phone]   Fax:  [phone]  Physical Therapy Treatment  Patient Details  Name: Abigail Wright MRN: [id number] Date of Birth: 1965-03-06 Referring Provider (PT): Cleophas Dunker   Encounter Date: 08/20/2021  Rationale for Evaluation and Treatment Rehabilitation    PT End of Session - 08/20/21 1148     Visit Number 8    Number of Visits 12    Date for PT Re-Evaluation 08/27/21    PT Start Time 1148    PT Stop Time 1230    PT Time Calculation (min) 42 min    Activity Tolerance Patient tolerated treatment well    Behavior During Therapy [facility] for tasks assessed/performed             Past Medical History:  Diagnosis Date   ADD (attention deficit disorder)    Anxiety    Arthritis    Blood transfusion without reported diagnosis    1993   Depression    GERD (gastroesophageal reflux disease)    in past but recently controlled with diet and exercise.   Hyperlipidemia     Past Surgical History:  Procedure Laterality Date   ABDOMINAL HYSTERECTOMY     BUNIONECTOMY Left    CARPAL TUNNEL RELEASE Bilateral    cyst removal from left hand Left    FOOT SURGERY     KNEE CARTILAGE SURGERY     planters fascitis     SHOULDER ARTHROSCOPY WITH SUBACROMIAL DECOMPRESSION AND OPEN ROTATOR C Right 07/07/2021   Procedure: RIGHT SHOULDER ARTHROSCOPY WITH SUBACROMIAL DECOMPRESSION / ROTATOR CUFF REPAIR/ DISTAL CLAVICLE RESECTION /BICEPS TENODESIS;  Surgeon: Valeria Batman, MD;  [facility]: [facility];  Service: Orthopedics;  Laterality: Right;   TENDON REPAIR Left 03/21/2017   Left hand    TONSILLECTOMY      There were no vitals filed for this visit.   Subjective Assessment - 08/20/21 1152     Subjective A little sore after last session but no pain.    Patient Stated Goals get use of my arm back    Currently in Pain? No/denies                [facility] PT Assessment  - 08/20/21 0001       PROM   Right Shoulder Flexion 160 Degrees    Right Shoulder ABduction 100 Degrees    Right Shoulder Internal Rotation 64 Degrees    Right Shoulder External Rotation 55 Degrees   at 90 deg ABD                          [facility] Adult PT Treatment/Exercise - 08/20/21 0001       Shoulder Exercises: Supine   External Rotation AAROM;10 reps    External Rotation Limitations with cane 10 sec hold    Flexion AAROM;10 reps    Flexion Limitations with cane 10 sec hold      Shoulder Exercises: Seated   Abduction AAROM;10 reps    ABduction Limitations with cane 10 sec hold      Shoulder Exercises: Pulleys   Flexion 2 minutes    ABduction 2 minutes      Shoulder Exercises: Isometric Strengthening   Flexion 5X5"    Flexion Limitations red TB - reactive isometrics    Extension 5X5"    Extension Limitations red TB reactive isometrics    External Rotation 5X5"    External Rotation  Limitations red TB reactive isometrics    Internal Rotation 5X5"    Internal Rotation Limitations red TB reactive isometrics    ABduction 5X5"    ABduction Limitations red TB reactive isometric    ADduction 5X5"    ADduction Limitations red TB reactive isometrics      Manual Therapy   Passive ROM PROM in tolerable range shoulder flexion, abduction, ER, IR                          PT Long Term Goals - 07/16/21 0160       PT LONG TERM GOAL #1   Title Pt will be independent in HEP    Time 6    Period Weeks    Status New    Target Date 08/27/21      PT LONG TERM GOAL #2   Title Pt will improve Rt shoulder ROM to 140 flexion and 40 ER as allowed per protocol    Time 6    Period Weeks    Status New    Target Date 08/27/21      PT LONG TERM GOAL #3   Title Pt will improve Rt shoulder strength to 3/5 as allowed by protocol    Time 6    Period Weeks    Status New    Target Date 08/27/21                   Plan - 08/20/21 1214      Clinical Impression Statement Delana tolerated exercises well. She has some discomfort with ABD with pulleys and cane. Much improvement in ROM in flex, ABD and ER.    PT Frequency 2x / week    PT Duration 6 weeks    PT Treatment/Interventions Aquatic Therapy;Cryotherapy;Electrical Stimulation;Iontophoresis 4mg /ml Dexamethasone;Moist Heat;Balance training;Neuromuscular re-education;Therapeutic exercise;Therapeutic activities;Patient/family education;Manual techniques;Passive range of motion;Dry needling;Taping;Vasopneumatic Device    PT Next Visit Plan isometrics, pulleys, AAROM per protocol    PT Home Exercise Plan [id number]    Consulted and Agree with Plan of Care Patient             Patient will benefit from skilled therapeutic intervention in order to improve the following deficits and impairments:  Pain, Impaired UE functional use, Decreased strength, Decreased activity tolerance, Decreased range of motion, Increased edema  Visit Diagnosis: Acute pain of right shoulder  Muscle weakness (generalized)  Abnormal posture     Problem List Patient Active Problem List   Diagnosis Date Noted   Pain in right knee 08/04/2021   Olecranon bursitis, right elbow 08/04/2021   Complete tear of right rotator cuff 07/14/2021   Impingement syndrome of right shoulder 06/17/2021   Major depressive disorder, recurrent episode, moderate (HCC) 03/06/2021   Pain in left knee 11/04/2017   Benign paroxysmal positional vertigo of left ear 05/15/2015   Family history of breast cancer in mother 08/02/2011    10/02/2011, PT 08/20/2021, 12:36 PM  [facility] [place] [address] [place] [address] [place], [place], [place] Phone: [phone]   Fax:  [phone]  Name: Virtie Bungert MRN: [name] Date of Birth: [birth year]

## 2021-09-01 ENCOUNTER — Encounter: Payer: Self-pay | Admitting: Adult Health

## 2021-09-01 ENCOUNTER — Encounter: Payer: Self-pay | Admitting: Orthopaedic Surgery

## 2021-09-01 ENCOUNTER — Telehealth (INDEPENDENT_AMBULATORY_CARE_PROVIDER_SITE_OTHER): Payer: Self-pay | Admitting: Adult Health

## 2021-09-01 ENCOUNTER — Ambulatory Visit (INDEPENDENT_AMBULATORY_CARE_PROVIDER_SITE_OTHER): Payer: No Typology Code available for payment source | Admitting: Orthopaedic Surgery

## 2021-09-01 ENCOUNTER — Ambulatory Visit: Payer: No Typology Code available for payment source | Attending: Orthopaedic Surgery | Admitting: Physical Therapy

## 2021-09-01 ENCOUNTER — Ambulatory Visit: Payer: No Typology Code available for payment source | Admitting: Orthopaedic Surgery

## 2021-09-01 DIAGNOSIS — F909 Attention-deficit hyperactivity disorder, unspecified type: Secondary | ICD-10-CM

## 2021-09-01 DIAGNOSIS — M6281 Muscle weakness (generalized): Secondary | ICD-10-CM | POA: Diagnosis present

## 2021-09-01 DIAGNOSIS — S46011D Strain of muscle(s) and tendon(s) of the rotator cuff of right shoulder, subsequent encounter: Secondary | ICD-10-CM

## 2021-09-01 DIAGNOSIS — F331 Major depressive disorder, recurrent, moderate: Secondary | ICD-10-CM

## 2021-09-01 DIAGNOSIS — M25511 Pain in right shoulder: Secondary | ICD-10-CM | POA: Insufficient documentation

## 2021-09-01 DIAGNOSIS — R293 Abnormal posture: Secondary | ICD-10-CM | POA: Insufficient documentation

## 2021-09-01 DIAGNOSIS — F411 Generalized anxiety disorder: Secondary | ICD-10-CM

## 2021-09-01 MED ORDER — METHYLPREDNISOLONE ACETATE 40 MG/ML IJ SUSP
80.0000 mg | INTRAMUSCULAR | Status: AC | PRN
  Administered 2021-09-01: 80 mg via INTRA_ARTICULAR

## 2021-09-01 MED ORDER — CLONAZEPAM 0.5 MG PO TABS: 0.5000 mg | ORAL_TABLET | Freq: Every day | ORAL | 2 refills | Status: DC

## 2021-09-01 MED ORDER — AMPHETAMINE-DEXTROAMPHETAMINE 30 MG PO TABS: 30.0000 mg | ORAL_TABLET | Freq: Two times a day (BID) | ORAL | 0 refills | Status: DC

## 2021-09-01 MED ORDER — CITALOPRAM HYDROBROMIDE 20 MG PO TABS: ORAL_TABLET | ORAL | 3 refills | Status: DC

## 2021-09-01 MED ORDER — BUPROPION HCL ER (XL) 300 MG PO TB24: 300.0000 mg | ORAL_TABLET | Freq: Every day | ORAL | 3 refills | Status: DC

## 2021-09-01 MED ORDER — LIDOCAINE HCL 1 % IJ SOLN
2.0000 mL | INTRAMUSCULAR | Status: AC | PRN
  Administered 2021-09-01: 2 mL

## 2021-09-01 MED ORDER — BUPIVACAINE HCL 0.25 % IJ SOLN
2.0000 mL | INTRAMUSCULAR | Status: AC | PRN
  Administered 2021-09-01: 2 mL via INTRA_ARTICULAR

## 2021-09-01 MED ORDER — AMPHETAMINE-DEXTROAMPHETAMINE 30 MG PO TABS: 1.0000 | ORAL_TABLET | Freq: Two times a day (BID) | ORAL | 0 refills | Status: DC

## 2021-09-01 NOTE — Progress Notes (Signed)
Office Visit Note   Patient: Abigail Wright           Date of Birth: Feb 22, 1966           MRN: [id number] Visit Date: 09/01/2021              Requested by: Sharlene Dory, DO [address] [place],  [place] [address] PCP: Sharlene Dory, DO   Assessment & Plan: Visit Diagnoses:  1. Traumatic complete tear of right rotator cuff, subsequent encounter     Plan: 8 weeks status post right shoulder surgery including arthroscopic SA D, DCR and mini open rotator cuff tear repair biceps tenodesis.  Has completed physical therapy.  Doing well and having less pain.  Okay to remove the sling.  Continue working on strengthening exercises and return in 4 to 6 weeks.  Also has a chronic history of right knee pain after an injury in December 2022.  MRI scan of her knee was just performed demonstrating cartilage thinning worse at the patella apex and the superior pole where there is almost completely denuded and cartilage thinning in the medial lateral compartments.  There was a radial tear in the posterior horn peripheral to the meniscal root medially.  There is mild to moderate osteoarthritis in the knee but mostly about the patella.  She is having trouble with deep knee bends and squats and inclines.  She is not having any significant pain medially so I do not think the meniscal tear is symptomatic at this point.  Long discussion regarding strengthening exercises.  I did inject the knee with cortisone today and we will see her back in several weeks  Follow-Up Instructions: Return in about 2 weeks (around 09/15/2021).   Orders:  No orders of the defined types were placed in this encounter.  No orders of the defined types were placed in this encounter.     Procedures: Large Joint Inj: R knee on 09/01/2021 5:27 PM Indications: pain and diagnostic evaluation Details: 25 G 1.5 in needle, anteromedial approach  Arthrogram: No  Medications: 2 mL lidocaine 1 %; 80 mg  methylPREDNISolone acetate 40 MG/ML; 2 mL bupivacaine 0.25 % Procedure, treatment alternatives, risks and benefits explained, specific risks discussed. Consent was given by the patient. Immediately prior to procedure a time out was called to verify the correct patient, procedure, equipment, support staff and site/side marked as required. Patient was prepped and draped in the usual sterile fashion.       Clinical Data: No additional findings.   Subjective: Chief Complaint  Patient presents with   Right Knee - Follow-up  8 weeks status post right shoulder surgery as outlined above with rotator cuff tear repair and biceps tenodesis.  Doing well.  Also returns for evaluation of chronic right knee pain as previously outlined.  Had an MRI scan for review  HPI  Review of Systems   Objective: Vital Signs: There were no vitals taken for this visit.  Physical Exam Constitutional:      Appearance: She is well-developed.  Eyes:     Pupils: Pupils are equal, round, and reactive to light.  Pulmonary:     Effort: Pulmonary effort is normal.  Skin:    General: Skin is warm and dry.  Neurological:     Mental Status: She is alert and oriented to person, place, and time.  Psychiatric:        Behavior: Behavior normal.     Ortho Exam right shoulder incisions  of healed without problem.  Good grip and release.  Skin intact.  I can abduct at least 90 degrees and flex probably 130 or so degrees.  No localized areas of tenderness.  Right knee without effusion.  Large knee.  No opening with varus valgus stress.  No medial or lateral joint pain.  Positive patella crepitation and some pain with patella compression.  Full extension flexed about 95 degrees  Specialty Comments:  No specialty comments available.  Imaging: No results found.   PMFS History: Patient Active Problem List   Diagnosis Date Noted   Pain in right knee 08/04/2021   Olecranon bursitis, right elbow 08/04/2021   Complete  tear of right rotator cuff 07/14/2021   Impingement syndrome of right shoulder 06/17/2021   Major depressive disorder, recurrent episode, moderate (HCC) 03/06/2021   Pain in left knee 11/04/2017   Benign paroxysmal positional vertigo of left ear 05/15/2015   Family history of breast cancer in mother 08/02/2011   Past Medical History:  Diagnosis Date   ADD (attention deficit disorder)    Anxiety    Arthritis    Blood transfusion without reported diagnosis    1993   Depression    GERD (gastroesophageal reflux disease)    in past but recently controlled with diet and exercise.   Hyperlipidemia     Family History  Problem Relation Age of Onset   Cancer Mother    Diabetes Father    Heart disease Paternal Uncle    Hypertension Paternal Uncle    Colon cancer Neg Hx    Esophageal cancer Neg Hx    Rectal cancer Neg Hx    Stomach cancer Neg Hx     Past Surgical History:  Procedure Laterality Date   ABDOMINAL HYSTERECTOMY     BUNIONECTOMY Left    CARPAL TUNNEL RELEASE Bilateral    cyst removal from left hand Left    FOOT SURGERY     KNEE CARTILAGE SURGERY     planters fascitis     SHOULDER ARTHROSCOPY WITH SUBACROMIAL DECOMPRESSION AND OPEN ROTATOR C Right 07/07/2021   Procedure: RIGHT SHOULDER ARTHROSCOPY WITH SUBACROMIAL DECOMPRESSION / ROTATOR CUFF REPAIR/ DISTAL CLAVICLE RESECTION /BICEPS TENODESIS;  Surgeon: Valeria Batman, MD;  Location: [facility];  Service: Orthopedics;  Laterality: Right;   TENDON REPAIR Left 03/21/2017   Left hand    TONSILLECTOMY     Social History   Occupational History   Not on file  Tobacco Use   Smoking status: Former   Smokeless tobacco: Never  Vaping Use   Vaping Use: Never used  Substance and Sexual Activity   Alcohol use: Never   Drug use: No   Sexual activity: Yes    Partners: Male    Birth control/protection: None, Post-menopausal     Valeria Batman, MD   Note - This record has been created using Animal nutritionist.  Chart  creation errors have been sought, but may not always  have been located. Such creation errors do not reflect on  the standard of medical care.

## 2021-09-01 NOTE — Therapy (Signed)
[facility] [place] [address] [place] [address] [place], [place], [address] Phone: [phone]   Fax:  [phone]  Physical Therapy Treatment and Recertification  Patient Details  Name: Abigail Wright MRN: [id number] Date of Birth: [birth year] Referring Provider (PT): Cleophas Dunker   Encounter Date: 09/01/2021 Rationale for Evaluation and Treatment Rehabilitation   PT End of Session - 09/01/21 1433     Visit Number 9    Number of Visits 21    Date for PT Re-Evaluation 10/13/21    PT Start Time 1357    PT Stop Time 1435    PT Time Calculation (min) 38 min    Activity Tolerance Patient tolerated treatment well    Behavior During Therapy [facility] for tasks assessed/performed             Past Medical History:  Diagnosis Date   ADD (attention deficit disorder)    Anxiety    Arthritis    Blood transfusion without reported diagnosis    1993   Depression    GERD (gastroesophageal reflux disease)    in past but recently controlled with diet and exercise.   Hyperlipidemia     Past Surgical History:  Procedure Laterality Date   ABDOMINAL HYSTERECTOMY     BUNIONECTOMY Left    CARPAL TUNNEL RELEASE Bilateral    cyst removal from left hand Left    FOOT SURGERY     KNEE CARTILAGE SURGERY     planters fascitis     SHOULDER ARTHROSCOPY WITH SUBACROMIAL DECOMPRESSION AND OPEN ROTATOR C Right 07/07/2021   Procedure: RIGHT SHOULDER ARTHROSCOPY WITH SUBACROMIAL DECOMPRESSION / ROTATOR CUFF REPAIR/ DISTAL CLAVICLE RESECTION /BICEPS TENODESIS;  Surgeon: Valeria Batman, MD;  Location: [facility];  Service: Orthopedics;  Laterality: Right;   TENDON REPAIR Left 03/21/2017   Left hand    TONSILLECTOMY      There were no vitals filed for this visit.   Subjective Assessment - 09/01/21 1404     Subjective Pt states she is feeling much better. Sometimes she feels she is using her arm too much and is sore afterwards    Patient Stated Goals get use of my arm  back    Currently in Pain? No/denies                [facility] PT Assessment - 09/01/21 0001       PROM   Right Shoulder Flexion 160 Degrees    Right Shoulder ABduction 105 Degrees    Right Shoulder Internal Rotation 72 Degrees    Right Shoulder External Rotation 62 Degrees                           [facility] Adult PT Treatment/Exercise - 09/01/21 0001       Shoulder Exercises: Seated   Abduction AAROM;10 reps    ABduction Limitations with cane 10 sec hold      Shoulder Exercises: Pulleys   Flexion 2 minutes    ABduction 2 minutes      Shoulder Exercises: Isometric Strengthening   Flexion 5X5"    Flexion Limitations red TB - reactive isometrics    Extension 5X5"    Extension Limitations red TB reactive isometrics    External Rotation 5X5"    External Rotation Limitations red TB reactive isometrics    Internal Rotation 5X5"    Internal Rotation Limitations red TB reactive isometrics    ABduction 5X5"    ABduction Limitations red TB reactive  isometric    ADduction 5X5"    ADduction Limitations red TB reactive isometrics      Manual Therapy   Passive ROM PROM in tolerable range shoulder flexion, abduction, ER, IR                          PT Long Term Goals - 09/01/21 1429       PT LONG TERM GOAL #1   Title Pt will be independent in HEP    Status On-going    Target Date 10/13/21      PT LONG TERM GOAL #2   Title Pt will improve Rt shoulder ROM to 140 flexion and 40 ER as allowed per protocol    Status Achieved      PT LONG TERM GOAL #3   Title Pt will improve Rt shoulder strength to 3/5 as allowed by protocol    Status On-going    Target Date 10/13/21      PT LONG TERM GOAL #4   Title Pt will improve abdcution ROM to 160 to return to full functional mobility    Time 6    Period Weeks    Status New    Target Date 10/13/21      PT LONG TERM GOAL #5   Title Pt will be able to lift grandaughter without Rt shoulder pain     Time 6    Period Weeks    Status New    Target Date 10/13/21                   Plan - 09/01/21 1434     Clinical Impression Statement Pt continues to progress well through protocol. Still limited in ER/IR and abd. Strength is improving with isometrics. No pain. She will continue to benefit from skilled PT to address deficits and work towards goals    PT Frequency 2x / week    PT Duration 6 weeks    PT Treatment/Interventions Aquatic Therapy;Cryotherapy;Electrical Stimulation;Iontophoresis 4mg /ml Dexamethasone;Moist Heat;Balance training;Neuromuscular re-education;Therapeutic exercise;Therapeutic activities;Patient/family education;Manual techniques;Passive range of motion;Dry needling;Taping;Vasopneumatic Device    PT Next Visit Plan isometrics, pulleys, AAROM per protocol    PT Home Exercise Plan [id number]    Consulted and Agree with Plan of Care Patient             Patient will benefit from skilled therapeutic intervention in order to improve the following deficits and impairments:     Visit Diagnosis: Acute pain of right shoulder - Plan: PT plan of care cert/re-cert  Muscle weakness (generalized) - Plan: PT plan of care cert/re-cert  Abnormal posture - Plan: PT plan of care cert/re-cert     Problem List Patient Active Problem List   Diagnosis Date Noted   Pain in right knee 08/04/2021   Olecranon bursitis, right elbow 08/04/2021   Complete tear of right rotator cuff 07/14/2021   Impingement syndrome of right shoulder 06/17/2021   Major depressive disorder, recurrent episode, moderate (HCC) 03/06/2021   Pain in left knee 11/04/2017   Benign paroxysmal positional vertigo of left ear 05/15/2015   Family history of breast cancer in mother 08/02/2011    10/02/2011, PT 09/01/2021, 2:36 PM  [facility] [place] [address] [place] [address] [place], [place], [place] Phone: [phone]   Fax:  [phone]  Name:  Abigail Wright MRN: [name] Date of Birth: 02-10-1966

## 2021-09-01 NOTE — Progress Notes (Signed)
Abigail Wright [id number] 12/23/1965 56 y.o.  Virtual Visit via Video Note  I connected with pt @ on 09/01/21 at 12:00 PM EDT by a video enabled telemedicine application and verified that I am speaking with the correct person using two identifiers.   I discussed the limitations of evaluation and management by telemedicine and the availability of in person appointments. The patient expressed understanding and agreed to proceed.  I discussed the assessment and treatment plan with the patient. The patient was provided an opportunity to ask questions and all were answered. The patient agreed with the plan and demonstrated an understanding of the instructions.   The patient was advised to call back or seek an in-person evaluation if the symptoms worsen or if the condition fails to improve as anticipated.  I provided 20 minutes of non-face-to-face time during this encounter.  The patient was located at home.  The provider was located at [facility].   Dorothyann Gibbs, NP   Subjective:   Patient ID:  Abigail Wright is a 56 y.o. (DOB [birth year]) female.  Chief Complaint: No chief complaint on file.   HPI Abigail Wright presents for follow-up of anxiety, ADHD and depression.  Describes mood today as "ok". Pleasant. Denies tearfulness. Mood symptoms - denies depression, anxiety and irritability. Stating "I'm doing alright". Feels like medications are working well. Recovering from shoulder surgery - April 17th - hoping to be at 90% by October. Limited in things she can do. Husband helping out. Recent beach trip. Stable interest and motivation. Taking medications as prescribed.  Energy levels stable. Active, does not have a regular exercise routine.  Enjoys some usual interests and activities. Married. Lives with husband of 33 years. Spending time with family - 2 daughters - 2 granddaughters. Appetite adequate. Weight stable.  Sleeps well most nights. Averages 7 to 8  hours.  Focus and  concentration stable. Completing tasks. Managing aspects of household. 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   Denies SI or HI.  Denies AH or VH.   Review of Systems:  Review of Systems  Musculoskeletal:  Negative for gait problem.  Neurological:  Negative for tremors.  Psychiatric/Behavioral:         Please refer to HPI    Medications: I have reviewed the patient's current medications.  Current Outpatient Medications  Medication Sig Dispense Refill   acetaminophen (TYLENOL) 325 MG tablet Take 325 mg by mouth every 6 (six) hours as needed for moderate pain.     amphetamine-dextroamphetamine (ADDERALL) 30 MG tablet Take 1 tablet by mouth 2 (two) times daily. 60 tablet 0   [START ON 09/29/2021] amphetamine-dextroamphetamine (ADDERALL) 30 MG tablet Take 1 tablet by mouth 2 (two) times daily. 60 tablet 0   [START ON 10/27/2021] amphetamine-dextroamphetamine (ADDERALL) 30 MG tablet Take 1 tablet by mouth 2 (two) times daily. 60 tablet 0   buPROPion (WELLBUTRIN XL) 300 MG 24 hr tablet Take 1 tablet (300 mg total) by mouth daily. 90 tablet 3   cetirizine (ZYRTEC) 10 MG tablet Take 10 mg by mouth daily.     citalopram (CELEXA) 20 MG tablet TAKE 1 TABLET BY MOUTH EACH DAY 90 tablet 3   clonazePAM (KLONOPIN) 0.5 MG tablet Take 1 tablet (0.5 mg total) by mouth at bedtime. 90 tablet 2   diclofenac Sodium (VOLTAREN) 1 % GEL Apply 1 application. topically 3 (three) times daily as needed (pain).     fluticasone (FLONASE) 50 MCG/ACT nasal spray Place 1 spray into both nostrils daily.  Multiple Vitamins-Minerals (MULTIVITAMIN ADULT EXTRA C PO) Take 1 tablet by mouth daily.     oxyCODONE-acetaminophen (PERCOCET/ROXICET) 5-325 MG tablet Take 1 tablet by mouth every 4 (four) hours as needed for severe pain. 30 tablet 0   No current facility-administered medications for this visit.    Medication Side Effects: None  Allergies: No Known Allergies  Past Medical History:  Diagnosis Date   ADD (attention  deficit disorder)    Anxiety    Arthritis    Blood transfusion without reported diagnosis    1993   Depression    GERD (gastroesophageal reflux disease)    in past but recently controlled with diet and exercise.   Hyperlipidemia     Family History  Problem Relation Age of Onset   Cancer Mother    Diabetes Father    Heart disease Paternal Uncle    Hypertension Paternal Uncle    Colon cancer Neg Hx    Esophageal cancer Neg Hx    Rectal cancer Neg Hx    Stomach cancer Neg Hx     Social History   Socioeconomic History   Marital status: Married    Spouse name: Not on file   Number of children: Not on file   Years of education: Not on file   Highest education level: Not on file  Occupational History   Not on file  Tobacco Use   Smoking status: Former   Smokeless tobacco: Never  Vaping Use   Vaping Use: Never used  Substance and Sexual Activity   Alcohol use: Never   Drug use: No   Sexual activity: Yes    Partners: Male    Birth control/protection: None, Post-menopausal  Other Topics Concern   Not on file  Social History Narrative   Not on file   Social Determinants of Health   Financial Resource Strain: Not on file  Food Insecurity: Not on file  Transportation Needs: Not on file  Physical Activity: Not on file  Stress: Not on file  Social Connections: Not on file  Intimate Partner Violence: Not on file    Past Medical History, Surgical history, Social history, and Family history were reviewed and updated as appropriate.   Please see review of systems for further details on the patient's review from today.   Objective:   Physical Exam:  There were no vitals taken for this visit.  Physical Exam  Lab Review:     Component Value Date/Time   NA 138 03/13/2021 1110   K 4.2 03/13/2021 1110   CL 101 03/13/2021 1110   CO2 31 03/13/2021 1110   GLUCOSE 118 (H) 03/13/2021 1110   BUN 15 03/13/2021 1110   CREATININE 0.95 03/13/2021 1110   CALCIUM 9.8  03/13/2021 1110   PROT 7.3 03/10/2021 1039   ALBUMIN 4.6 03/10/2021 1039   AST 18 03/10/2021 1039   ALT 24 03/10/2021 1039   ALKPHOS 76 03/10/2021 1039   BILITOT 0.7 03/10/2021 1039       Component Value Date/Time   WBC 7.9 06/30/2021 1455   RBC 5.02 06/30/2021 1455   HGB 14.3 06/30/2021 1455   HCT 44.3 06/30/2021 1455   PLT 275 06/30/2021 1455   MCV 88.2 06/30/2021 1455   MCH 28.5 06/30/2021 1455   MCHC 32.3 06/30/2021 1455   RDW 13.3 06/30/2021 1455    No results found for: "POCLITH", "LITHIUM"   No results found for: "PHENYTOIN", "PHENOBARB", "VALPROATE", "CBMZ"   .res Assessment: Plan:     Plan:  1. Adderall 30mg  BID 2. Celexa 20 mg daily 3. Wellbutrin XL 300mg  4. Clonazepam 0.5mg  daily   Continue to monitor BP - WNL  RTC 3 months  Patient advised to contact office with any questions, adverse effects, or acute worsening in signs and symptoms.  Discussed potential benefits, risk, and side effects of benzodiazepines to include potential risk of tolerance and dependence, as well as possible drowsiness.  Advised patient not to drive if experiencing drowsiness and to take lowest possible effective dose to minimize risk of dependence and tolerance.  Discussed potential benefits, risks, and side effects of stimulants with patient to include increased heart rate, palpitations, insomnia, increased anxiety, increased irritability, or decreased appetite.  Instructed patient to contact office if experiencing any significant tolerability issues.  Diagnoses and all orders for this visit:  Attention deficit hyperactivity disorder (ADHD), unspecified ADHD type  Generalized anxiety disorder -     clonazePAM (KLONOPIN) 0.5 MG tablet; Take 1 tablet (0.5 mg total) by mouth at bedtime. -     citalopram (CELEXA) 20 MG tablet; TAKE 1 TABLET BY MOUTH EACH DAY  Major depressive disorder, recurrent episode, moderate (HCC) -     citalopram (CELEXA) 20 MG tablet; TAKE 1 TABLET BY  MOUTH EACH DAY -     amphetamine-dextroamphetamine (ADDERALL) 30 MG tablet; Take 1 tablet by mouth 2 (two) times daily. -     amphetamine-dextroamphetamine (ADDERALL) 30 MG tablet; Take 1 tablet by mouth 2 (two) times daily. -     amphetamine-dextroamphetamine (ADDERALL) 30 MG tablet; Take 1 tablet by mouth 2 (two) times daily.  Other orders -     buPROPion (WELLBUTRIN XL) 300 MG 24 hr tablet; Take 1 tablet (300 mg total) by mouth daily.     Please see After Visit Summary for patient specific instructions.  Future Appointments  Date Time Provider Department Center  09/01/2021  2:00 PM , PT [facility]  09/01/2021  3:15 PM [facility] None  09/02/2021  1:15 PM Marlyne Beards, MD [facility] None  09/03/2021 11:45 AM Valeria Batman April Ma L, PT [facility]    No orders of the defined types were placed in this encounter.     -------------------------------

## 2021-09-02 ENCOUNTER — Ambulatory Visit: Payer: No Typology Code available for payment source | Admitting: Orthopaedic Surgery

## 2021-09-03 ENCOUNTER — Ambulatory Visit: Payer: No Typology Code available for payment source | Admitting: Physical Therapy

## 2021-09-03 DIAGNOSIS — M6281 Muscle weakness (generalized): Secondary | ICD-10-CM

## 2021-09-03 DIAGNOSIS — M25511 Pain in right shoulder: Secondary | ICD-10-CM

## 2021-09-03 DIAGNOSIS — R293 Abnormal posture: Secondary | ICD-10-CM

## 2021-09-03 NOTE — Therapy (Signed)
[facility] [place] [address] [place], [place], [address] Phone: [phone]   Fax:  [phone]  Physical Therapy Treatment  Patient Details  Name: Abigail Wright MRN: [id number] Date of Birth: 07-19-1965 Referring Provider (PT): Cleophas Dunker  Rationale for Evaluation and Treatment Rehabilitation  Encounter Date: 09/03/2021   PT End of Session - 09/03/21 1148     Visit Number 10    Number of Visits 21    Date for PT Re-Evaluation 10/13/21    PT Start Time 1147    PT Stop Time 1230    PT Time Calculation (min) 43 min    Activity Tolerance Patient tolerated treatment well    Behavior During Therapy [facility] for tasks assessed/performed             Past Medical History:  Diagnosis Date   ADD (attention deficit disorder)    Anxiety    Arthritis    Blood transfusion without reported diagnosis    1993   Depression    GERD (gastroesophageal reflux disease)    in past but recently controlled with diet and exercise.   Hyperlipidemia     Past Surgical History:  Procedure Laterality Date   ABDOMINAL HYSTERECTOMY     BUNIONECTOMY Left    CARPAL TUNNEL RELEASE Bilateral    cyst removal from left hand Left    FOOT SURGERY     KNEE CARTILAGE SURGERY     planters fascitis     SHOULDER ARTHROSCOPY WITH SUBACROMIAL DECOMPRESSION AND OPEN ROTATOR C Right 07/07/2021   Procedure: RIGHT SHOULDER ARTHROSCOPY WITH SUBACROMIAL DECOMPRESSION / ROTATOR CUFF REPAIR/ DISTAL CLAVICLE RESECTION /BICEPS TENODESIS;  Surgeon: Valeria Batman, MD;  Location: [facility];  Service: Orthopedics;  Laterality: Right;   TENDON REPAIR Left 03/21/2017   Left hand    TONSILLECTOMY      There were no vitals filed for this visit.   Subjective Assessment - 09/03/21 1148     Subjective Pt reports ortho visit went well. No longer has to use sling. Cleared to continue strengthening.    Patient Stated Goals get use of my arm back    Currently in Pain?  No/denies                [facility] PT Assessment - 09/03/21 0001       Assessment   Medical Diagnosis Rt RTC repair with biceps tenodesis    Referring Provider (PT) [place]    Onset Date/Surgical Date 07/07/21    Hand Dominance Right    Next MD Visit 2 weeks      PROM   Right Shoulder Internal Rotation 75 Degrees    Right Shoulder External Rotation 77 Degrees                           [facility] Adult PT Treatment/Exercise - 09/03/21 0001       Shoulder Exercises: Supine   External Rotation AAROM;10 reps    External Rotation Limitations with cane 10 sec hold    Flexion AAROM;10 reps    Flexion Limitations with cane 10 sec hold      Shoulder Exercises: Seated   Internal Rotation AAROM;Left;10 reps    Internal Rotation Limitations with strap    Abduction AAROM;10 reps    ABduction Limitations with cane 10 sec hold      Shoulder Exercises: Pulleys   Flexion 2 minutes    Scaption 2 minutes    Other Pulley  Exercises horizontal abd/add x 10      Shoulder Exercises: ROM/Strengthening   Other ROM/Strengthening Exercises finger wall crawl 2x10 flex & abd      Shoulder Exercises: Isometric Strengthening   Flexion 5X5"    Flexion Limitations green TB - reactive isometrics    Extension 5X10"    Extension Limitations green TB - reactive isometrics    External Rotation 5X10"    External Rotation Limitations green TB - reactive isometrics    Internal Rotation 5X10"    Internal Rotation Limitations green TB - reactive isometrics    ABduction 5X10"    ABduction Limitations green TB - reactive isometrics    ADduction Limitations green TB - reactive isometrics    Other Isometric Exercises row isometric green tband 10x10"      Manual Therapy   Passive ROM PROM in tolerable range shoulder flexion, abduction, ER, IR                          PT Long Term Goals - 09/01/21 1429       PT LONG TERM GOAL #1   Title Pt will be independent in HEP     Status On-going    Target Date 10/13/21      PT LONG TERM GOAL #2   Title Pt will improve Rt shoulder ROM to 140 flexion and 40 ER as allowed per protocol    Status Achieved      PT LONG TERM GOAL #3   Title Pt will improve Rt shoulder strength to 3/5 as allowed by protocol    Status On-going    Target Date 10/13/21      PT LONG TERM GOAL #4   Title Pt will improve abdcution ROM to 160 to return to full functional mobility    Time 6    Period Weeks    Status New    Target Date 10/13/21      PT LONG TERM GOAL #5   Title Pt will be able to lift grandaughter without Rt shoulder pain    Time 6    Period Weeks    Status New    Target Date 10/13/21                   Plan - 09/03/21 1211     Clinical Impression Statement Pt continues to have improving ROM with no pain. Progressing well with her strengthening exercises. Tolerated increase to green tband.    Personal Factors and Comorbidities Time since onset of injury/illness/exacerbation    Examination-Activity Limitations Dressing;Carry;Caring for Others;Reach Overhead;Lift;Hygiene/Grooming    Examination-Participation Restrictions Driving;Community Activity;Cleaning;Meal Prep;Laundry    PT Frequency 2x / week    PT Duration 6 weeks    PT Treatment/Interventions Aquatic Therapy;Cryotherapy;Electrical Stimulation;Iontophoresis 4mg /ml Dexamethasone;Moist Heat;Balance training;Neuromuscular re-education;Therapeutic exercise;Therapeutic activities;Patient/family education;Manual techniques;Passive range of motion;Dry needling;Taping;Vasopneumatic Device    PT Next Visit Plan isometrics, pulleys, AAROM per protocol    PT Home Exercise Plan [email or web address]    Consulted and Agree with Plan of Care Patient             Patient will benefit from skilled therapeutic intervention in order to improve the following deficits and impairments:  Pain, Impaired UE functional use, Decreased strength, Decreased activity tolerance, Decreased  range of motion, Increased edema  Visit Diagnosis: Acute pain of right shoulder  Muscle weakness (generalized)  Abnormal posture     Problem List Patient Active Problem List   Diagnosis Date  Noted   Pain in right knee 08/04/2021   Olecranon bursitis, right elbow 08/04/2021   Complete tear of right rotator cuff 07/14/2021   Impingement syndrome of right shoulder 06/17/2021   Major depressive disorder, recurrent episode, moderate (HCC) 03/06/2021   Pain in left knee 11/04/2017   Benign paroxysmal positional vertigo of left ear 05/15/2015   Family history of breast cancer in mother 08/02/2011    [facility] April Dell Ponto, PT, DPT 09/03/2021, 12:16 PM  [facility] [address] [place], [place], [address] Phone: [phone]   Fax:  [phone]  Name: Narcissa Melder MRN: [id number] Date of Birth: 03-22-1965

## 2021-09-08 ENCOUNTER — Ambulatory Visit: Payer: No Typology Code available for payment source | Admitting: Rehabilitative and Restorative Service Providers"

## 2021-09-08 ENCOUNTER — Encounter: Payer: Self-pay | Admitting: Rehabilitative and Restorative Service Providers"

## 2021-09-08 DIAGNOSIS — M6281 Muscle weakness (generalized): Secondary | ICD-10-CM

## 2021-09-08 DIAGNOSIS — M25511 Pain in right shoulder: Secondary | ICD-10-CM

## 2021-09-08 DIAGNOSIS — R293 Abnormal posture: Secondary | ICD-10-CM

## 2021-09-08 NOTE — Therapy (Signed)
[facility] [place] [address] [place], [place], [address] Phone: [phone]   Fax:  [phone]  Physical Therapy Treatment  Rationale for Evaluation and Treatment Rehabilitation  Patient Details  Name: Abigail Wright MRN: [id number] Date of Birth: May 19, 1965 Referring Provider (PT): Cleophas Dunker   Encounter Date: 09/08/2021   PT End of Session - 09/08/21 1054     Visit Number 11    Number of Visits 21    Date for PT Re-Evaluation 10/13/21    PT Start Time 1054    PT Stop Time 1140    PT Time Calculation (min) 46 min    Activity Tolerance Patient tolerated treatment well             Past Medical History:  Diagnosis Date   ADD (attention deficit disorder)    Anxiety    Arthritis    Blood transfusion without reported diagnosis    1993   Depression    GERD (gastroesophageal reflux disease)    in past but recently controlled with diet and exercise.   Hyperlipidemia     Past Surgical History:  Procedure Laterality Date   ABDOMINAL HYSTERECTOMY     BUNIONECTOMY Left    CARPAL TUNNEL RELEASE Bilateral    cyst removal from left hand Left    FOOT SURGERY     KNEE CARTILAGE SURGERY     planters fascitis     SHOULDER ARTHROSCOPY WITH SUBACROMIAL DECOMPRESSION AND OPEN ROTATOR C Right 07/07/2021   Procedure: RIGHT SHOULDER ARTHROSCOPY WITH SUBACROMIAL DECOMPRESSION / ROTATOR CUFF REPAIR/ DISTAL CLAVICLE RESECTION /BICEPS TENODESIS;  Surgeon: Valeria Batman, MD;  Location: [facility];  Service: Orthopedics;  Laterality: Right;   TENDON REPAIR Left 03/21/2017   Left hand    TONSILLECTOMY      There were no vitals filed for this visit.   Subjective Assessment - 09/08/21 1054     Subjective Patient reports that she is not having pain in the shoulder. She is working on her exercises at home without problems.    Currently in Pain? No/denies    Pain Score 0-No pain    Pain Location Shoulder    Pain Orientation Right                                [facility] Adult PT Treatment/Exercise - 09/08/21 0001       Therapeutic Activites    Other Therapeutic Activities myofacial ball release 4 in plastic ball standing at wall Rt shoulder girdle      Shoulder Exercises: Supine   Other Supine Exercises scap squeeze 10 sec x 10 reps      Shoulder Exercises: Pulleys   Flexion Limitations 10 sec hold x 10 reps    Scaption Limitations 10 sec hold x 10 reps    Other Pulley Exercises horizontal abd/add x 10      Shoulder Exercises: Therapy Ball   Other Therapy Ball Exercises seated holding ball betweenhands for UE work - elbow flexion/extension; diagonal shouder to opposite knee; chest press; steering wheel arms extended; overhead press to ~ 80 deg flexion 10 reps each activity    Other Therapy Ball Exercises standing ball on wall ~ chest height - rolling ball up/down; side to side; circles CW/CCW x 10-15 each      Shoulder Exercises: Stretch   Other Shoulder Stretches bicep stretch against doorframe 2x30 sec      Manual Therapy  Manual therapy comments deep tissue work through teres/lats into the anterior chest/pecs and deltoid    Soft tissue mobilization soft tissue mobilization Rt pecs/deltoid/biceps areas    Myofascial Release anterior arm/biceps/deltoid area    Passive ROM PROM with disassociation of scapula from [facility] joint movement for shoulder flexion and scaption; shoulder extension; horizontal abduction pt supine working within pt tolerance and tissue limits                     PT Education - 09/08/21 1137     Education Details HEP    Person(s) Educated Patient    Methods Explanation;Demonstration;Tactile cues;Verbal cues;Handout    Comprehension Verbalized understanding;Returned demonstration;Verbal cues required;Tactile cues required                 PT Long Term Goals - 09/01/21 1429       PT LONG TERM GOAL #1   Title Pt will be independent in HEP    Status  On-going    Target Date 10/13/21      PT LONG TERM GOAL #2   Title Pt will improve Rt shoulder ROM to 140 flexion and 40 ER as allowed per protocol    Status Achieved      PT LONG TERM GOAL #3   Title Pt will improve Rt shoulder strength to 3/5 as allowed by protocol    Status On-going    Target Date 10/13/21      PT LONG TERM GOAL #4   Title Pt will improve abdcution ROM to 160 to return to full functional mobility    Time 6    Period Weeks    Status New    Target Date 10/13/21      PT LONG TERM GOAL #5   Title Pt will be able to lift grandaughter without Rt shoulder pain    Time 6    Period Weeks    Status New    Target Date 10/13/21                   Plan - 09/08/21 1057     Clinical Impression Statement Progressing well with Rt shoulder rehab. Working on ROM and strength. Palpable tightness noted through teres/lats. Working on disassociation of scapula and GH joint for PROM. Good response to myofacial ball release work. Tolerated new exercises well.    Rehab Potential Good    PT Frequency 2x / week    PT Duration 6 weeks    PT Treatment/Interventions Aquatic Therapy;Cryotherapy;Electrical Stimulation;Iontophoresis 4mg /ml Dexamethasone;Moist Heat;Balance training;Neuromuscular re-education;Therapeutic exercise;Therapeutic activities;Patient/family education;Manual techniques;Passive range of motion;Dry needling;Taping;Vasopneumatic Device    PT Next Visit Plan isometrics, pulleys, AAROM, progress with strengthening per protocol    PT Home Exercise Plan [email or web address]    Consulted and Agree with Plan of Care Patient             Patient will benefit from skilled therapeutic intervention in order to improve the following deficits and impairments:     Visit Diagnosis: Acute pain of right shoulder  Muscle weakness (generalized)  Abnormal posture     Problem List Patient Active Problem List   Diagnosis Date Noted   Pain in right knee 08/04/2021   Olecranon  bursitis, right elbow 08/04/2021   Complete tear of right rotator cuff 07/14/2021   Impingement syndrome of right shoulder 06/17/2021   Major depressive disorder, recurrent episode, moderate (HCC) 03/06/2021   Pain in left knee 11/04/2017   Benign paroxysmal positional vertigo of left  ear 05/15/2015   Family history of breast cancer in mother 08/02/2011    Val Riles, PT, MPH  09/08/2021, 11:48 AM  [facility] [address] [place], [place], [address] Phone: [phone]   Fax:  [phone]  Name: Tully Burgo MRN: [id number] Date of Birth: 14-Feb-1966

## 2021-09-08 NOTE — Patient Instructions (Addendum)
Holding small ball between hands   Sitting; Lap to chest Press out in front Diagonal shoulder to opposite knee(chopping wood) both directions Steering wheel  Press overhead (do not let your shoulder blade elevate)  Standing; Ball on wall about chest height  Roll ball up and down Roll ball side to side Circles CW/CCW  Access Code: [email or web address] URL: [email or web address] Date: 09/08/2021 Prepared by: Corlis Leak  Exercises - Shoulder External Rotation Reactive Isometrics  - 1 x daily - 3 x weekly - 1 sets - 5 reps - 5 sec hold - Shoulder Internal Rotation Reactive Isometrics  - 1 x daily - 3 x weekly - 1 sets - 5 reps - 5 sec hold - Standing Shoulder Flexion Reactive Isometrics with Elbow Extended  - 1 x daily - 3 x weekly - 1 sets - 5 reps - 5 sec hold - Shoulder Extension Reactive Isometrics with Elbow Extended  - 1 x daily - 3 x weekly - 1 sets - 5 reps - 5 sec hold - Standing Shoulder Abduction Reactive Isometrics with Elbow Extended  - 1 x daily - 3 x weekly - 1 sets - 5 reps - 5 sec hold - Standing Shoulder Adduction Reactive Isometrics with Resistance and Elbow Extended  - 1 x daily - 3 x weekly - 1 sets - 5 reps - 5 sec hold - Supine Shoulder Flexion with Dowel  - 1 x daily - 7 x weekly - 1 sets - 10 reps - 10 sec hold - Supine Shoulder External Rotation with Dowel  - 1 x daily - 7 x weekly - 1 sets - 10 reps - 10 sec hold - Seated Shoulder Abduction AAROM with Dowel  - 1 x daily - 7 x weekly - 1 sets - 10 reps - 10 sec hold - Supine Scapular Retraction  - 2 x daily - 7 x weekly - 1 sets - 10 reps - 5-10 sec  hold - Standing Scapular Retraction  - 3 x daily - 7 x weekly - 1 sets - 10 reps - 10 sec  hold - Standing Infraspinatus/Teres Minor Release with Ball at Wall  - 2 x daily - 7 x weekly

## 2021-09-10 ENCOUNTER — Ambulatory Visit: Payer: No Typology Code available for payment source | Admitting: Physical Therapy

## 2021-09-15 ENCOUNTER — Ambulatory Visit: Payer: No Typology Code available for payment source | Admitting: Physical Therapy

## 2021-09-15 ENCOUNTER — Other Ambulatory Visit: Payer: Self-pay

## 2021-09-15 ENCOUNTER — Encounter: Payer: Self-pay | Admitting: Orthopaedic Surgery

## 2021-09-15 ENCOUNTER — Ambulatory Visit (INDEPENDENT_AMBULATORY_CARE_PROVIDER_SITE_OTHER): Payer: No Typology Code available for payment source | Admitting: Orthopaedic Surgery

## 2021-09-15 ENCOUNTER — Encounter: Payer: Self-pay | Admitting: Physical Therapy

## 2021-09-15 DIAGNOSIS — M6281 Muscle weakness (generalized): Secondary | ICD-10-CM

## 2021-09-15 DIAGNOSIS — R293 Abnormal posture: Secondary | ICD-10-CM

## 2021-09-15 DIAGNOSIS — M25511 Pain in right shoulder: Secondary | ICD-10-CM

## 2021-09-15 DIAGNOSIS — M75121 Complete rotator cuff tear or rupture of right shoulder, not specified as traumatic: Secondary | ICD-10-CM

## 2021-09-15 NOTE — Progress Notes (Signed)
Office Visit Note   Patient: Abigail Wright           Date of Birth: 1965-05-08           MRN: [id number] Visit Date: 09/15/2021              Requested by: Sharlene Dory, DO [address] [place],  [place] [address] PCP: Sharlene Dory, DO  Chief Complaint  Patient presents with   Right Shoulder - Follow-up      HPI: Patient is 10 weeks status post right shoulder arthroscopy biceps tenodesis subacromial decompression mini open rotator cuff repair.  She is doing therapy and reports she is doing well.  She also had an injection into her knee.  She is doing physical therapy for strengthening  Assessment & Plan: Visit Diagnoses:  1. Complete tear of right rotator cuff, unspecified whether traumatic     Plan: We will continue to do strengthening with physical therapy cautioned her about doing sudden acceleration deceleration lifting.  She will follow-up for final visit in 1 month.  Follow-Up Instructions: No follow-ups on file.   Ortho Exam  Patient is alert, oriented, no adenopathy, well-dressed, normal affect, normal respiratory effort. Examination of her shoulder well-healed surgical incision no redness no erythema.  Distal sensation is intact.  She does have almost full forward elevation internal rotation behind her back to mid back.  Grip strength is strong.  No real tenderness to palpation  Imaging: No results found. No images are attached to the encounter.  Labs: No results found for: "HGBA1C", "ESRSEDRATE", "CRP", "LABURIC", "REPTSTATUS", "GRAMSTAIN", "CULT", "LABORGA"   Lab Results  Component Value Date   ALBUMIN 4.6 03/10/2021   ALBUMIN 4.3 07/26/2017    No results found for: "MG" No results found for: "VD25OH"  No results found for: "PREALBUMIN"    Latest Ref Rng & Units 06/30/2021    2:55 PM 03/10/2021   10:39 AM 07/26/2017    9:03 AM  CBC EXTENDED  WBC 4.0 - 10.5 K/uL 7.9  5.7  5.5   RBC 3.87 - 5.11 MIL/uL 5.02  5.28   4.92   Hemoglobin 12.0 - 15.0 g/dL 68.1  27.5  17.0   HCT 36.0 - 46.0 % 44.3  45.6  43.4   Platelets 150 - 400 K/uL 275  287.0  292.0      There is no height or weight on file to calculate BMI.  Orders:  No orders of the defined types were placed in this encounter.  No orders of the defined types were placed in this encounter.    Procedures: No procedures performed  Clinical Data: No additional findings.  ROS:  All other systems negative, except as noted in the HPI. Review of Systems  Objective: Vital Signs: There were no vitals taken for this visit.  Specialty Comments:  No specialty comments available.  PMFS History: Patient Active Problem List   Diagnosis Date Noted   Pain in right knee 08/04/2021   Olecranon bursitis, right elbow 08/04/2021   Complete tear of right rotator cuff 07/14/2021   Impingement syndrome of right shoulder 06/17/2021   Major depressive disorder, recurrent episode, moderate (HCC) 03/06/2021   Pain in left knee 11/04/2017   Benign paroxysmal positional vertigo of left ear 05/15/2015   Family history of breast cancer in mother 08/02/2011   Past Medical History:  Diagnosis Date   ADD (attention deficit disorder)    Anxiety    Arthritis  Blood transfusion without reported diagnosis    1993   Depression    GERD (gastroesophageal reflux disease)    in past but recently controlled with diet and exercise.   Hyperlipidemia     Family History  Problem Relation Age of Onset   Cancer Mother    Diabetes Father    Heart disease Paternal Uncle    Hypertension Paternal Uncle    Colon cancer Neg Hx    Esophageal cancer Neg Hx    Rectal cancer Neg Hx    Stomach cancer Neg Hx     Past Surgical History:  Procedure Laterality Date   ABDOMINAL HYSTERECTOMY     BUNIONECTOMY Left    CARPAL TUNNEL RELEASE Bilateral    cyst removal from left hand Left    FOOT SURGERY     KNEE CARTILAGE SURGERY     planters fascitis     SHOULDER ARTHROSCOPY  WITH SUBACROMIAL DECOMPRESSION AND OPEN ROTATOR C Right 07/07/2021   Procedure: RIGHT SHOULDER ARTHROSCOPY WITH SUBACROMIAL DECOMPRESSION / ROTATOR CUFF REPAIR/ DISTAL CLAVICLE RESECTION /BICEPS TENODESIS;  Surgeon: Valeria Batman, MD;  Location: [facility];  Service: Orthopedics;  Laterality: Right;   TENDON REPAIR Left 03/21/2017   Left hand    TONSILLECTOMY     Social History   Occupational History   Not on file  Tobacco Use   Smoking status: Former   Smokeless tobacco: Never  Vaping Use   Vaping Use: Never used  Substance and Sexual Activity   Alcohol use: Never   Drug use: No   Sexual activity: Yes    Partners: Male    Birth control/protection: None, Post-menopausal

## 2021-09-15 NOTE — Patient Instructions (Signed)
Access Code: [email or web address] URL: [email or web address] Date: 09/15/2021 Prepared by: Reggy Eye  Exercises - Supine Scapular Retraction  - 2 x daily - 7 x weekly - 1 sets - 10 reps - 5-10 sec  hold - Standing Scapular Retraction  - 3 x daily - 7 x weekly - 1 sets - 10 reps - 10 sec  hold - Standing Infraspinatus/Teres Minor Release with Ball at [employer]  - 2 x daily - 7 x weekly - Standing Shoulder Flexion to 90 Degrees with Dumbbells  - 1 x daily - 3 x weekly - 2 sets - 10 reps - Scaption with Dumbbells  - 1 x daily - 3 x weekly - 2 sets - 10 reps - Standing Bicep Curls Supinated with Dumbbells  - 1 x daily - 3 x weekly - 2 sets - 10 reps - Supine Scapular Protraction in Flexion with Dumbbells  - 1 x daily - 3 x weekly - 2 sets - 10 reps

## 2021-09-15 NOTE — Therapy (Signed)
[facility] [place] [address] [place], [place], [address] Phone: [phone]   Fax:  [phone]  Physical Therapy Treatment  Patient Details  Name: Abigail Wright MRN: [id number] Date of Birth: Jul 10, 1965 Referring Provider (PT): Cleophas Dunker   Encounter Date: 09/15/2021 Rationale for Evaluation and Treatment Rehabilitation   PT End of Session - 09/15/21 1132     Visit Number 12    Number of Visits 21    Date for PT Re-Evaluation 10/13/21    PT Start Time 1055    PT Stop Time 1133    PT Time Calculation (min) 38 min    Activity Tolerance Patient tolerated treatment well    Behavior During Therapy [facility] for tasks assessed/performed             Past Medical History:  Diagnosis Date   ADD (attention deficit disorder)    Anxiety    Arthritis    Blood transfusion without reported diagnosis    1993   Depression    GERD (gastroesophageal reflux disease)    in past but recently controlled with diet and exercise.   Hyperlipidemia     Past Surgical History:  Procedure Laterality Date   ABDOMINAL HYSTERECTOMY     BUNIONECTOMY Left    CARPAL TUNNEL RELEASE Bilateral    cyst removal from left hand Left    FOOT SURGERY     KNEE CARTILAGE SURGERY     planters fascitis     SHOULDER ARTHROSCOPY WITH SUBACROMIAL DECOMPRESSION AND OPEN ROTATOR C Right 07/07/2021   Procedure: RIGHT SHOULDER ARTHROSCOPY WITH SUBACROMIAL DECOMPRESSION / ROTATOR CUFF REPAIR/ DISTAL CLAVICLE RESECTION /BICEPS TENODESIS;  Surgeon: Valeria Batman, MD;  Location: [facility];  Service: Orthopedics;  Laterality: Right;   TENDON REPAIR Left 03/21/2017   Left hand    TONSILLECTOMY      There were no vitals filed for this visit.   Subjective Assessment - 09/15/21 1100     Subjective Pt states she continues to not have any pain. She can tell when she "over does" it but overall much better    Patient Stated Goals get use of my arm back    Currently in  Pain? No/denies                [facility] PT Assessment - 09/15/21 0001       Assessment   Medical Diagnosis Rt RTC repair with biceps tenodesis    Referring Provider (PT) [place]    Onset Date/Surgical Date 07/07/21    Hand Dominance Right    Next MD Visit 7/25      PROM   Right Shoulder Flexion 172 Degrees    Right Shoulder ABduction 135 Degrees    Right Shoulder Internal Rotation 73 Degrees    Right Shoulder External Rotation 77 Degrees                           [facility] Adult PT Treatment/Exercise - 09/15/21 0001       Shoulder Exercises: Supine   Protraction 20 reps    Protraction Weight (lbs) 3      Shoulder Exercises: Seated   Retraction 20 reps    Other Seated Exercises bicep curl 3# 2 x 10      Shoulder Exercises: Sidelying   External Rotation 15 reps    External Rotation Weight (lbs) 1#      Shoulder Exercises: Standing   Flexion 10 reps  Shoulder Flexion Weight (lbs) 2#    Flexion Limitations then scaption x 10 2 #    Extension 20 reps    Theraband Level (Shoulder Extension) Level 3 (Green)    Row 20 reps    Theraband Level (Shoulder Row) Level 3 (Green)      Shoulder Exercises: Pulleys   Flexion 2 minutes    Scaption 2 minutes      Shoulder Exercises: Therapy Ball   Other Therapy Ball Exercises ball on wall 30 x CW/CCW                     PT Education - 09/15/21 1126     Education Details updated HEP    Person(s) Educated Patient    Methods Explanation;Demonstration    Comprehension Verbalized understanding;Returned demonstration                 PT Long Term Goals - 09/01/21 1429       PT LONG TERM GOAL #1   Title Pt will be independent in HEP    Status On-going    Target Date 10/13/21      PT LONG TERM GOAL #2   Title Pt will improve Rt shoulder ROM to 140 flexion and 40 ER as allowed per protocol    Status Achieved      PT LONG TERM GOAL #3   Title Pt will improve Rt shoulder strength to 3/5  as allowed by protocol    Status On-going    Target Date 10/13/21      PT LONG TERM GOAL #4   Title Pt will improve abdcution ROM to 160 to return to full functional mobility    Time 6    Period Weeks    Status New    Target Date 10/13/21      PT LONG TERM GOAL #5   Title Pt will be able to lift grandaughter without Rt shoulder pain    Time 6    Period Weeks    Status New    Target Date 10/13/21                    Patient will benefit from skilled therapeutic intervention in order to improve the following deficits and impairments:     Visit Diagnosis: Acute pain of right shoulder  Muscle weakness (generalized)  Abnormal posture     Problem List Patient Active Problem List   Diagnosis Date Noted   Pain in right knee 08/04/2021   Olecranon bursitis, right elbow 08/04/2021   Complete tear of right rotator cuff 07/14/2021   Impingement syndrome of right shoulder 06/17/2021   Major depressive disorder, recurrent episode, moderate (HCC) 03/06/2021   Pain in left knee 11/04/2017   Benign paroxysmal positional vertigo of left ear 05/15/2015   Family history of breast cancer in mother 08/02/2011    Reggy Eye, PT 09/15/2021, 11:34 AM  [facility] [address] [place], [place], [address] Phone: [phone]   Fax:  [phone]  Name: Abigail Wright MRN: [id number] Date of Birth: 04/19/1965

## 2021-09-17 ENCOUNTER — Encounter: Payer: Self-pay | Admitting: Physical Therapy

## 2021-09-17 ENCOUNTER — Ambulatory Visit: Payer: No Typology Code available for payment source | Admitting: Physical Therapy

## 2021-09-17 DIAGNOSIS — M25511 Pain in right shoulder: Secondary | ICD-10-CM | POA: Diagnosis not present

## 2021-09-17 DIAGNOSIS — R293 Abnormal posture: Secondary | ICD-10-CM

## 2021-09-17 DIAGNOSIS — M6281 Muscle weakness (generalized): Secondary | ICD-10-CM

## 2021-09-17 NOTE — Therapy (Signed)
OUTPATIENT PHYSICAL THERAPY TREATMENT NOTE   Patient Name: Abigail Wright MRN: [id number] DOB:[birth year], 56 y.o., female Today's Date: 09/17/2021  PCP: Arva Chafe REFERRING PROVIDER: Norlene Campbell   PT End of Session - 09/17/21 1048     Visit Number 13    Number of Visits 21    Date for PT Re-Evaluation 10/13/21    PT Start Time 1050    PT Stop Time 1130    PT Time Calculation (min) 40 min    Activity Tolerance Patient tolerated treatment well    Behavior During Therapy WFL for tasks assessed/performed             Past Medical History:  Diagnosis Date   ADD (attention deficit disorder)    Anxiety    Arthritis    Blood transfusion without reported diagnosis    1993   Depression    GERD (gastroesophageal reflux disease)    in past but recently controlled with diet and exercise.   Hyperlipidemia    Past Surgical History:  Procedure Laterality Date   ABDOMINAL HYSTERECTOMY     BUNIONECTOMY Left    CARPAL TUNNEL RELEASE Bilateral    cyst removal from left hand Left    FOOT SURGERY     KNEE CARTILAGE SURGERY     planters fascitis     SHOULDER ARTHROSCOPY WITH SUBACROMIAL DECOMPRESSION AND OPEN ROTATOR C Right 07/07/2021   Procedure: RIGHT SHOULDER ARTHROSCOPY WITH SUBACROMIAL DECOMPRESSION / ROTATOR CUFF REPAIR/ DISTAL CLAVICLE RESECTION /BICEPS TENODESIS;  Surgeon: Valeria Batman, MD;  Location: [facility];  Service: Orthopedics;  Laterality: Right;   TENDON REPAIR Left 03/21/2017   Left hand    TONSILLECTOMY     Patient Active Problem List   Diagnosis Date Noted   Pain in right knee 08/04/2021   Olecranon bursitis, right elbow 08/04/2021   Complete tear of right rotator cuff 07/14/2021   Impingement syndrome of right shoulder 06/17/2021   Major depressive disorder, recurrent episode, moderate (HCC) 03/06/2021   Pain in left knee 11/04/2017   Benign paroxysmal positional vertigo of left ear 05/15/2015   Family history of breast cancer in mother  08/02/2011    REFERRING DIAG: right shoulder arthroscopy biceps tenodesis subacromial decompression mini open rotator cuff repair  THERAPY DIAG:  Acute pain of right shoulder  Muscle weakness (generalized)  Abnormal posture  Rationale for Evaluation and Treatment Rehabilitation  PERTINENT HISTORY: 07/07/21 right shoulder arthroscopy biceps tenodesis subacromial decompression mini open rotator cuff repair  PRECAUTIONS: per protocol  SUBJECTIVE: Pt reports some soreness on Wednesday after last session. States R shoulder/UT was sore enough to wake her up.   PAIN:  Are you having pain? Yes: NPRS scale: 4/10 Pain location: R shoulder Pain description: soreness Aggravating factors: after exercises Relieving factors: Rest and ice     TODAY'S TREATMENT:  09/17/21 Seated:  Pulleys:  Scaption x2 min  Abd x 2 min  Standing  Doorway pec stretch x30 sec low, mid, high  Row green tband 2x10   Shoulder ext green tband 2x10  Against pool noodle:    Shoulder ER green tband 2x10   snow angels 2x10   "W" green tband 2x10  Shoulder IR green tband 2x10  Flexion 2# 2x10  Scaption 2# 2x10  Manual therapy:  Skilled assessment and palpation for TPDN  Trigger Point Dry-Needling  Treatment instructions: Expect mild to moderate muscle soreness. S/S of pneumothorax if dry needled over a lung field, and to seek immediate medical attention should they occur.  Patient verbalized understanding of these instructions and education.  Patient Consent Given: Yes Education handout provided: Yes Muscles treated: UT Electrical stimulation performed: No Parameters: N/A Treatment response/outcome: Increased muscle length, twitch response illicited  STM and TPR Uts K-tape to facilitate scapular positioning   09/15/21  Shoulder Exercises: Supine   Protraction 20 reps    Protraction Weight (lbs) 3      Shoulder Exercises: Seated   Retraction 20 reps    Other Seated Exercises bicep curl 3# 2 x 10       Shoulder Exercises: Sidelying   External Rotation 15 reps    External Rotation Weight (lbs) 1#      Shoulder Exercises: Standing   Flexion 10 reps    Shoulder Flexion Weight (lbs) 2#    Flexion Limitations then scaption x 10 2 #    Extension 20 reps    Theraband Level (Shoulder Extension) Level 3 (Green)    Row 20 reps    Theraband Level (Shoulder Row) Level 3 (Green)      Shoulder Exercises: Pulleys   Flexion 2 minutes    Scaption 2 minutes      Shoulder Exercises: Therapy Ball   Other Therapy Ball Exercises ball on wall 30 x CW/CCW      PATIENT EDUCATION: Education details: HEP modifications and progressions Person educated: Patient Education method: Programmer, multimedia, Verbal cues, and Handouts Education comprehension: verbalized understanding and returned demonstration   HOME EXERCISE PROGRAM: Access Code: [email or web address]     PT Long Term Goals - 09/01/21 1429       PT LONG TERM GOAL #1   Title Pt will be independent in HEP    Status On-going    Target Date 10/13/21      PT LONG TERM GOAL #2   Title Pt will improve Rt shoulder ROM to 140 flexion and 40 ER as allowed per protocol    Status Achieved      PT LONG TERM GOAL #3   Title Pt will improve Rt shoulder strength to 3/5 as allowed by protocol    Status On-going    Target Date 10/13/21      PT LONG TERM GOAL #4   Title Pt will improve abdcution ROM to 160 to return to full functional mobility    Time 6    Period Weeks    Status New    Target Date 10/13/21      PT LONG TERM GOAL #5   Title Pt will be able to lift grandaughter without Rt shoulder pain    Time 6    Period Weeks    Status New    Target Date 10/13/21               Plan     Clinical Impression Statement Pt with some mild soreness after initiating strengthening. Discussed icing. Trialed TPDN and taping for R UT soreness/tightness. Continued strengthening this session.    Rehab Potential Good    PT Frequency 2x / week    PT  Duration 6 weeks    PT Treatment/Interventions Aquatic Therapy;Cryotherapy;Electrical Stimulation;Iontophoresis 4mg /ml Dexamethasone;Moist Heat;Balance training;Neuromuscular re-education;Therapeutic exercise;Therapeutic activities;Patient/family education;Manual techniques;Passive range of motion;Dry needling;Taping;Vasopneumatic Device    PT Next Visit Plan Assess response to TPDN/manual work, pulleys, , progress with strengthening per protocol    PT Home Exercise Plan [email or web address]    Consulted and Agree with Plan of Care Patient            [facility] April May, PT,  DPT 09/17/2021, 10:48 AM

## 2021-09-22 ENCOUNTER — Ambulatory Visit
Payer: No Typology Code available for payment source | Attending: Orthopaedic Surgery | Admitting: Rehabilitative and Restorative Service Providers"

## 2021-09-22 ENCOUNTER — Encounter: Payer: Self-pay | Admitting: Rehabilitative and Restorative Service Providers"

## 2021-09-22 DIAGNOSIS — M6281 Muscle weakness (generalized): Secondary | ICD-10-CM | POA: Diagnosis present

## 2021-09-22 DIAGNOSIS — R293 Abnormal posture: Secondary | ICD-10-CM

## 2021-09-22 DIAGNOSIS — M25511 Pain in right shoulder: Secondary | ICD-10-CM | POA: Diagnosis present

## 2021-09-22 NOTE — Therapy (Signed)
OUTPATIENT PHYSICAL THERAPY TREATMENT NOTE   Patient Name: Abigail Wright MRN: [id number] DOB:1965-03-24, 56 y.o., female Today's Date: 09/22/2021  PCP: Arva Chafe REFERRING PROVIDER: Norlene Campbell   PT End of Session - 09/22/21 1055     Visit Number 14    Number of Visits 21    Date for PT Re-Evaluation 10/13/21    PT Start Time 1055    PT Stop Time 1140    PT Time Calculation (min) 45 min    Activity Tolerance Patient tolerated treatment well             Past Medical History:  Diagnosis Date   ADD (attention deficit disorder)    Anxiety    Arthritis    Blood transfusion without reported diagnosis    1993   Depression    GERD (gastroesophageal reflux disease)    in past but recently controlled with diet and exercise.   Hyperlipidemia    Past Surgical History:  Procedure Laterality Date   ABDOMINAL HYSTERECTOMY     BUNIONECTOMY Left    CARPAL TUNNEL RELEASE Bilateral    cyst removal from left hand Left    FOOT SURGERY     KNEE CARTILAGE SURGERY     planters fascitis     SHOULDER ARTHROSCOPY WITH SUBACROMIAL DECOMPRESSION AND OPEN ROTATOR C Right 07/07/2021   Procedure: RIGHT SHOULDER ARTHROSCOPY WITH SUBACROMIAL DECOMPRESSION / ROTATOR CUFF REPAIR/ DISTAL CLAVICLE RESECTION /BICEPS TENODESIS;  Surgeon: Valeria Batman, MD;  Location: [facility];  Service: Orthopedics;  Laterality: Right;   TENDON REPAIR Left 03/21/2017   Left hand    TONSILLECTOMY     Patient Active Problem List   Diagnosis Date Noted   Pain in right knee 08/04/2021   Olecranon bursitis, right elbow 08/04/2021   Complete tear of right rotator cuff 07/14/2021   Impingement syndrome of right shoulder 06/17/2021   Major depressive disorder, recurrent episode, moderate (HCC) 03/06/2021   Pain in left knee 11/04/2017   Benign paroxysmal positional vertigo of left ear 05/15/2015   Family history of breast cancer in mother 08/02/2011    REFERRING DIAG: right shoulder arthroscopy biceps  tenodesis subacromial decompression mini open rotator cuff repair  THERAPY DIAG:  Acute pain of right shoulder  Muscle weakness (generalized)  Abnormal posture  Rationale for Evaluation and Treatment Rehabilitation  PERTINENT HISTORY: 07/07/21 right shoulder arthroscopy biceps tenodesis subacromial decompression mini open rotator cuff repair  PRECAUTIONS: per protocol  SUBJECTIVE: Pt reports some last night. She "probably overdid it" lifting babies. She had to take medication last night. No pain this morning. DN seemed to helped loosen up the muscles in the shoulder. Could not tell a difference with the taping.   PAIN:  Are you having pain? Yes: NPRS scale: 0/10 Pain location: R shoulder Pain description: soreness Aggravating factors: after exercises Relieving factors: Rest and ice     TODAY'S TREATMENT:  09/17/21 Seated:  Pulleys:  Scaption x10 sec hold x 10 reps   Abd x 10 sec hold x 10 reps  Standing  Doorway pec stretch x30 sec low, mid, high  Row green tband 2x10   Shoulder ext green tband 2x10  Against pool noodle:    Shoulder ER green tband 2x10   snow angels 2x10   "W" green tband 2x10  Shoulder IR green tband 2x10  Flexion up wall yellow TB btn hands x 10 reps Upper trap stretch reaching for the floor with lateral cervical flexion 10 sec hold x 5  Manual therapy:  Skilled assessment and palpation for dry needling  Trigger Point Dry-Needling  Treatment instructions: Expect mild to moderate muscle soreness. S/S of pneumothorax if dry needled over a lung field, and to seek immediate medical attention should they occur. Patient verbalized understanding of these instructions and education.  Patient Consent Given: Yes Education handout provided: Previously issued Muscles treated: cervical paraspinals; suboccipitals; scaleni; upper trap; pecs Electrical stimulation performed: No Parameters: N/A Treatment response/outcome: Increased muscle  length, twitch response illicited   Soft tissue work through the cervical musculature; upper traps; pecs   Modalities: Moist heat cervical and shoulder post DN and manual work    PATIENT EDUCATION: Education details: HEP modifications and progressions Person educated: Patient Education method: Programmer, multimedia, Verbal cues, and Handouts Education comprehension: verbalized understanding and returned demonstration   HOME EXERCISE PROGRAM: Access Code: [email or web address] URL: [email or web address] Date: 09/22/2021 Prepared by: Corlis Leak  Exercises - Standing Infraspinatus/Teres Minor Release with Ball at [facility]  - 2 x daily - 7 x weekly - Standing Shoulder Flexion to 90 Degrees with Dumbbells  - 1 x daily - 3 x weekly - 2 sets - 10 reps - Scaption with Dumbbells  - 1 x daily - 3 x weekly - 2 sets - 10 reps - Standing Bicep Curls Supinated with Dumbbells  - 1 x daily - 3 x weekly - 2 sets - 10 reps - Supine Scapular Protraction in Flexion with Dumbbells  - 1 x daily - 3 x weekly - 2 sets - 10 reps - Shoulder External Rotation and Scapular Retraction with Resistance  - 1 x daily - 7 x weekly - 2 sets - 10 reps - Shoulder W - External Rotation with Resistance  - 1 x daily - 7 x weekly - 2 sets - 10 reps - Standing shoulder flexion wall slides  - 1 x daily - 7 x weekly - 1 sets - 5-10 reps - 2-3 sec  hold - Standing Upper Trapezius Stretch  - 2-3 x daily - 7 x weekly - 1 sets - 3 reps - 10 sec  hold - Supine Cervical Retraction with Towel  - 2 x daily - 7 x weekly - 1 sets - 5-10 reps - 10 sec  hold     PT Long Term Goals - 09/01/21 1429       PT LONG TERM GOAL #1   Title Pt will be independent in HEP    Status On-going    Target Date 10/13/21      PT LONG TERM GOAL #2   Title Pt will improve Rt shoulder ROM to 140 flexion and 40 ER as allowed per protocol    Status Achieved      PT LONG TERM GOAL #3   Title Pt will improve Rt shoulder strength to 3/5 as allowed by protocol     Status On-going    Target Date 10/13/21      PT LONG TERM GOAL #4   Title Pt will improve abdcution ROM to 160 to return to full functional mobility    Time 6    Period Weeks    Status New    Target Date 10/13/21      PT LONG TERM GOAL #5   Title Pt will be able to lift grandaughter without Rt shoulder pain    Time 6    Period Weeks    Status New    Target Date 10/13/21  Plan     Clinical Impression Statement Patient having "popping" in the Rt cervical spine with turning her head - has not experienced this prior to shoulder surgery. Note palpable tightness Rt posterior lateral cervical musculature into upper trap and pecs on Rt Added stretch for upper trap and trial of DN to cervical musculature. Good response to DN with decreased muscular following treatment. Patient reported decreased  soreness/tightness in upper trap last session and decreased tightness in areas needled today. Continued strengthening focus on posterior shoulder girdle.    Rehab Potential Good    PT Frequency 2x / week    PT Duration 6 weeks    PT Treatment/Interventions Aquatic Therapy;Cryotherapy;Electrical Stimulation;Iontophoresis 4mg /ml Dexamethasone;Moist Heat;Balance training;Neuromuscular re-education;Therapeutic exercise;Therapeutic activities;Patient/family education;Manual techniques;Passive range of motion;Dry needling;Taping;Vasopneumatic Device    PT Next Visit Plan Assess response to TPDN/manual work, pulleys, , progress with strengthening per protocol    PT Home Exercise Plan [email or web address]    Consulted and Agree with Plan of Care Patient            Barbaraann Boys, PT, MPH 09/22/2021, 10:58 AM   [facility] [address] [place], [place], [place] Phone: [phone]   Fax:  [phone]  Physical Therapy Treatment  Patient Details  Name: Abigail Wright MRN: [name] Date of Birth: [birth year] Referring Provider  (PT): 05/19/1965   Encounter Date: 09/22/2021   PT End of Session - 09/22/21 1055     Visit Number 14    Number of Visits 21    Date for PT Re-Evaluation 10/13/21    PT Start Time 1055    PT Stop Time 1140    PT Time Calculation (min) 45 min    Activity Tolerance Patient tolerated treatment well             Past Medical History:  Diagnosis Date   ADD (attention deficit disorder)    Anxiety    Arthritis    Blood transfusion without reported diagnosis    1993   Depression    GERD (gastroesophageal reflux disease)    in past but recently controlled with diet and exercise.   Hyperlipidemia     Past Surgical History:  Procedure Laterality Date   ABDOMINAL HYSTERECTOMY     BUNIONECTOMY Left    CARPAL TUNNEL RELEASE Bilateral    cyst removal from left hand Left    FOOT SURGERY     KNEE CARTILAGE SURGERY     planters fascitis     SHOULDER ARTHROSCOPY WITH SUBACROMIAL DECOMPRESSION AND OPEN ROTATOR C Right 07/07/2021   Procedure: RIGHT SHOULDER ARTHROSCOPY WITH SUBACROMIAL DECOMPRESSION / ROTATOR CUFF REPAIR/ DISTAL CLAVICLE RESECTION /BICEPS TENODESIS;  Surgeon: 07/09/2021, MD;  Location: [facility];  Service: Orthopedics;  Laterality: Right;   TENDON REPAIR Left 03/21/2017   Left hand    TONSILLECTOMY      There were no vitals filed for this visit.                                    PT Long Term Goals - 09/01/21 1429       PT LONG TERM GOAL #1   Title Pt will be independent in HEP    Status On-going    Target Date 10/13/21      PT LONG TERM GOAL #2   Title Pt will improve Rt shoulder ROM to 140 flexion and 40 ER  as allowed per protocol    Status Achieved      PT LONG TERM GOAL #3   Title Pt will improve Rt shoulder strength to 3/5 as allowed by protocol    Status On-going    Target Date 10/13/21      PT LONG TERM GOAL #4   Title Pt will improve abdcution ROM to 160 to return to full functional mobility    Time 6     Period Weeks    Status New    Target Date 10/13/21      PT LONG TERM GOAL #5   Title Pt will be able to lift grandaughter without Rt shoulder pain    Time 6    Period Weeks    Status New    Target Date 10/13/21                    Patient will benefit from skilled therapeutic intervention in order to improve the following deficits and impairments:     Visit Diagnosis: Acute pain of right shoulder  Muscle weakness (generalized)  Abnormal posture     Problem List Patient Active Problem List   Diagnosis Date Noted   Pain in right knee 08/04/2021   Olecranon bursitis, right elbow 08/04/2021   Complete tear of right rotator cuff 07/14/2021   Impingement syndrome of right shoulder 06/17/2021   Major depressive disorder, recurrent episode, moderate (HCC) 03/06/2021   Pain in left knee 11/04/2017   Benign paroxysmal positional vertigo of left ear 05/15/2015   Family history of breast cancer in mother 08/02/2011    Val Riles, PT, MPH  09/22/2021, 10:56 AM  [facility] [address] [place], [place], [address] Phone: [phone]   Fax:  [phone]  Name: Abigail Wright MRN: [id number] Date of Birth: 16-Mar-1965

## 2021-09-24 ENCOUNTER — Ambulatory Visit: Payer: No Typology Code available for payment source | Admitting: Rehabilitative and Restorative Service Providers"

## 2021-09-24 ENCOUNTER — Other Ambulatory Visit: Payer: Self-pay

## 2021-09-24 ENCOUNTER — Encounter: Payer: Self-pay | Admitting: Rehabilitative and Restorative Service Providers"

## 2021-09-24 DIAGNOSIS — M25511 Pain in right shoulder: Secondary | ICD-10-CM

## 2021-09-24 DIAGNOSIS — M6281 Muscle weakness (generalized): Secondary | ICD-10-CM

## 2021-09-24 DIAGNOSIS — R293 Abnormal posture: Secondary | ICD-10-CM

## 2021-09-24 NOTE — Therapy (Signed)
OUTPATIENT PHYSICAL THERAPY TREATMENT NOTE   Patient Name: Abigail Wright MRN: [id number] DOB:1965-11-30, 56 y.o., female Today's Date: 09/24/2021  PCP: Arva Chafe REFERRING PROVIDER: Norlene Campbell   PT End of Session - 09/24/21 1058     Visit Number 15    Number of Visits 21    Date for PT Re-Evaluation 10/13/21    PT Start Time 1057    PT Stop Time 1145    PT Time Calculation (min) 48 min    Activity Tolerance Patient tolerated treatment well             Past Medical History:  Diagnosis Date   ADD (attention deficit disorder)    Anxiety    Arthritis    Blood transfusion without reported diagnosis    1993   Depression    GERD (gastroesophageal reflux disease)    in past but recently controlled with diet and exercise.   Hyperlipidemia    Past Surgical History:  Procedure Laterality Date   ABDOMINAL HYSTERECTOMY     BUNIONECTOMY Left    CARPAL TUNNEL RELEASE Bilateral    cyst removal from left hand Left    FOOT SURGERY     KNEE CARTILAGE SURGERY     planters fascitis     SHOULDER ARTHROSCOPY WITH SUBACROMIAL DECOMPRESSION AND OPEN ROTATOR C Right 07/07/2021   Procedure: RIGHT SHOULDER ARTHROSCOPY WITH SUBACROMIAL DECOMPRESSION / ROTATOR CUFF REPAIR/ DISTAL CLAVICLE RESECTION /BICEPS TENODESIS;  Surgeon: Valeria Batman, MD;  Location: [facility];  Service: Orthopedics;  Laterality: Right;   TENDON REPAIR Left 03/21/2017   Left hand    TONSILLECTOMY     Patient Active Problem List   Diagnosis Date Noted   Pain in right knee 08/04/2021   Olecranon bursitis, right elbow 08/04/2021   Complete tear of right rotator cuff 07/14/2021   Impingement syndrome of right shoulder 06/17/2021   Major depressive disorder, recurrent episode, moderate (HCC) 03/06/2021   Pain in left knee 11/04/2017   Benign paroxysmal positional vertigo of left ear 05/15/2015   Family history of breast cancer in mother 08/02/2011    REFERRING DIAG: right shoulder arthroscopy biceps  tenodesis subacromial decompression mini open rotator cuff repair  THERAPY DIAG:  Acute pain of right shoulder  Muscle weakness (generalized)  Abnormal posture  Rationale for Evaluation and Treatment Rehabilitation  PERTINENT HISTORY: 07/07/21 right shoulder arthroscopy biceps tenodesis subacromial decompression mini open rotator cuff repair  PRECAUTIONS: per protocol  SUBJECTIVE: Pt reports some soreness and tightness. No pain this morning. DN seemed to helped loosen up the muscles in the shoulder.    PAIN:  Are you having pain? Yes: NPRS scale: 0/10 Pain location: R shoulder Pain description: soreness Aggravating factors: after exercises Relieving factors: Rest and ice     TODAY'S TREATMENT:  09/17/21 Seated:  Pulleys:  Scaption x10 sec hold x 10 reps   Abd x 10 sec hold x 10 reps  Standing  Doorway pec stretch 3 positions x30 sec lx 2 reps  Row green tband 2x10   Shoulder ext green tband 2x10  Against pool noodle:    Shoulder ER green tband 2x10   snow angels 2x10   "W" green tband 2x10  Shoulder IR green tband 2x10  Flexion up wall yellow TB btn hands x 10 reps Upper trap stretch reaching for the floor with lateral cervical flexion 10 sec hold x 5  Manual therapy:  Skilled assessment and palpation for dry needling  Trigger Point Dry-Needling  Treatment instructions: Expect mild to moderate muscle soreness. S/S of pneumothorax if dry needled over a lung field, and to seek immediate medical attention should they occur. Patient verbalized understanding of these instructions and education.  Patient Consent Given: Yes Education handout provided: Previously issued Muscles treated: cervical paraspinals; suboccipitals; scaleni; pecs Electrical stimulation performed: No Parameters: N/A Treatment response/outcome: Increased muscle length, twitch response illicited   Soft tissue work through the cervical musculature; upper traps; pecs    Modalities: Moist heat cervical and shoulder post DN and manual work    PATIENT EDUCATION: Education details: HEP modifications and progressions Person educated: Patient Education method: Programmer, multimediaxplanation, Verbal cues, and Handouts Education comprehension: verbalized understanding and returned demonstration   HOME EXERCISE PROGRAM: Access Code: [email or web address] URL: [email or web address] Date: 09/24/2021 Prepared by: Corlis Leakelyn Korbyn Vanes  Exercises - Standing Infraspinatus/Teres Minor Release [facility]  - 2 x daily - 7 x weekly - Standing Shoulder Flexion to 90 Degrees with Dumbbells  - 1 x daily - 3 x weekly - 2 sets - 10 reps - Scaption with Dumbbells  - 1 x daily - 3 x weekly - 2 sets - 10 reps - Standing Bicep Curls Supinated with Dumbbells  - 1 x daily - 3 x weekly - 2 sets - 10 reps - Supine Scapular Protraction in Flexion with Dumbbells  - 1 x daily - 3 x weekly - 2 sets - 10 reps - Shoulder External Rotation and Scapular Retraction with Resistance  - 1 x daily - 7 x weekly - 2 sets - 10 reps - Shoulder W - External Rotation with Resistance  - 1 x daily - 7 x weekly - 2 sets - 10 reps - Standing shoulder flexion wall slides  - 1 x daily - 7 x weekly - 1 sets - 5-10 reps - 2-3 sec  hold - Standing Upper Trapezius Stretch  - 2-3 x daily - 7 x weekly - 1 sets - 3 reps - 10 sec  hold - Supine Cervical Retraction with Towel  - 2 x daily - 7 x weekly - 1 sets - 5-10 reps - 10 sec  hold - Doorway Pec Stretch at 60 Degrees Abduction  - 3 x daily - 7 x weekly - 1 sets - 3 reps - Doorway Pec Stretch at 90 Degrees Abduction  - 3 x daily - 7 x weekly - 1 sets - 3 reps - 30 seconds  hold - Doorway Pec Stretch at 120 Degrees Abduction  - 3 x daily - 7 x weekly - 1 sets - 3 reps - 30 second hold  hold - Prone Scapular Retraction  - 2 x daily - 7 x weekly - 1 sets - 5-10 reps - 3-5 sec  hold         PT Long Term Goals - 09/01/21 1429       PT LONG TERM GOAL #1   Title Pt will be  independent in HEP    Status On-going    Target Date 10/13/21      PT LONG TERM GOAL #2   Title Pt will improve Rt shoulder ROM to 140 flexion and 40 ER as allowed per protocol    Status Achieved      PT LONG TERM GOAL #3   Title Pt will improve Rt shoulder strength to 3/5 as allowed by protocol    Status On-going  Target Date 10/13/21      PT LONG TERM GOAL #4   Title Pt will improve abdcution ROM to 160 to return to full functional mobility    Time 6    Period Weeks    Status New    Target Date 10/13/21      PT LONG TERM GOAL #5   Title Pt will be able to lift grandaughter without Rt shoulder pain    Time 6    Period Weeks    Status New    Target Date 10/13/21               Plan     Clinical Impression Statement Patient having no "popping" in the Rt cervical spine with turning her head after treatment today.  She did not experienced popping prior to shoulder surgery. Note palpable tightness Rt posterior lateral cervical musculature into upper trap and pecs on Rt Added pec stretch 3 positions and continued with DN to cervical musculature. Good response to DN with decreased muscular following treatment. Patient reported decreased  soreness/tightness in upper trap last session and decreased tightness in areas needled today. Continued strengthening focus on posterior shoulder girdle.    Rehab Potential Good    PT Frequency 2x / week    PT Duration 6 weeks    PT Treatment/Interventions Aquatic Therapy;Cryotherapy;Electrical Stimulation;Iontophoresis 4mg /ml Dexamethasone;Moist Heat;Balance training;Neuromuscular re-education;Therapeutic exercise;Therapeutic activities;Patient/family education;Manual techniques;Passive range of motion;Dry needling;Taping;Vasopneumatic Device    PT Next Visit Plan Assess response to TPDN/manual work, pulleys, , progress with strengthening per protocol    PT Home Exercise Plan [email or web address]    Consulted and Agree with Plan of Care Patient             Barbaraann Boys, PT, MPH 09/24/2021, 10:59 AM   [facility] [address] [place] [address] [place], [place], [place] Phone: [phone]   Fax:  [phone]  Physical Therapy Treatment  Patient Details  Name: Abigail Wright MRN: [name] Date of Birth: [birth year] Referring Provider (PT): 05/19/1965   Encounter Date: 09/24/2021   PT End of Session - 09/22/21 1055     Visit Number 14    Number of Visits 21    Date for PT Re-Evaluation 10/13/21    PT Start Time 1055    PT Stop Time 1140    PT Time Calculation (min) 45 min    Activity Tolerance Patient tolerated treatment well             Past Medical History:  Diagnosis Date   ADD (attention deficit disorder)    Anxiety    Arthritis    Blood transfusion without reported diagnosis    1993   Depression    GERD (gastroesophageal reflux disease)    in past but recently controlled with diet and exercise.   Hyperlipidemia     Past Surgical History:  Procedure Laterality Date   ABDOMINAL HYSTERECTOMY     BUNIONECTOMY Left    CARPAL TUNNEL RELEASE Bilateral    cyst removal from left hand Left    FOOT SURGERY     KNEE CARTILAGE SURGERY     planters fascitis     SHOULDER ARTHROSCOPY WITH SUBACROMIAL DECOMPRESSION AND OPEN ROTATOR C Right 07/07/2021   Procedure: RIGHT SHOULDER ARTHROSCOPY WITH SUBACROMIAL DECOMPRESSION / ROTATOR CUFF REPAIR/ DISTAL CLAVICLE RESECTION /BICEPS TENODESIS;  Surgeon: 07/09/2021, MD;  Location: [facility];  Service: Orthopedics;  Laterality: Right;   TENDON REPAIR Left 03/21/2017   Left hand  TONSILLECTOMY      There were no vitals filed for this visit.                                    PT Long Term Goals - 09/01/21 1429       PT LONG TERM GOAL #1   Title Pt will be independent in HEP    Status On-going    Target Date 10/13/21      PT LONG TERM GOAL #2   Title Pt will improve Rt shoulder ROM to  140 flexion and 40 ER as allowed per protocol    Status Achieved      PT LONG TERM GOAL #3   Title Pt will improve Rt shoulder strength to 3/5 as allowed by protocol    Status On-going    Target Date 10/13/21      PT LONG TERM GOAL #4   Title Pt will improve abdcution ROM to 160 to return to full functional mobility    Time 6    Period Weeks    Status New    Target Date 10/13/21      PT LONG TERM GOAL #5   Title Pt will be able to lift grandaughter without Rt shoulder pain    Time 6    Period Weeks    Status New    Target Date 10/13/21                    Patient will benefit from skilled therapeutic intervention in order to improve the following deficits and impairments:     Visit Diagnosis: Acute pain of right shoulder  Muscle weakness (generalized)  Abnormal posture     Problem List Patient Active Problem List   Diagnosis Date Noted   Pain in right knee 08/04/2021   Olecranon bursitis, right elbow 08/04/2021   Complete tear of right rotator cuff 07/14/2021   Impingement syndrome of right shoulder 06/17/2021   Major depressive disorder, recurrent episode, moderate (HCC) 03/06/2021   Pain in left knee 11/04/2017   Benign paroxysmal positional vertigo of left ear 05/15/2015   Family history of breast cancer in mother 08/02/2011    Abigail Wright, PT, MPH  09/24/2021, 10:59 AM  [facility] [address] [place] [address] [place], [place], [address] Phone: [phone]   Fax:  [phone]  Name: Abigail Wright MRN: [id number] Date of Birth: 06-18-1965  [facility] [place] [address] [place] [address] [place], [place], [address] Phone: [phone]   Fax:  [phone]  Physical Therapy Treatment  Patient Details  Name: Abigail Wright MRN: [id number] Date of Birth: 09/05/1965 Referring Provider (PT): Cleophas Dunker   Encounter Date: 09/24/2021   PT End of Session - 09/24/21 1058      Visit Number 15    Number of Visits 21    Date for PT Re-Evaluation 10/13/21    PT Start Time 1057    PT Stop Time 1145    PT Time Calculation (min) 48 min    Activity Tolerance Patient tolerated treatment well             Past Medical History:  Diagnosis Date   ADD (attention deficit disorder)    Anxiety    Arthritis    Blood transfusion without reported diagnosis    1993   Depression    GERD (gastroesophageal reflux disease)    in  past but recently controlled with diet and exercise.   Hyperlipidemia     Past Surgical History:  Procedure Laterality Date   ABDOMINAL HYSTERECTOMY     BUNIONECTOMY Left    CARPAL TUNNEL RELEASE Bilateral    cyst removal from left hand Left    FOOT SURGERY     KNEE CARTILAGE SURGERY     planters fascitis     SHOULDER ARTHROSCOPY WITH SUBACROMIAL DECOMPRESSION AND OPEN ROTATOR C Right 07/07/2021   Procedure: RIGHT SHOULDER ARTHROSCOPY WITH SUBACROMIAL DECOMPRESSION / ROTATOR CUFF REPAIR/ DISTAL CLAVICLE RESECTION /BICEPS TENODESIS;  Surgeon: Valeria Batman, MD;  Location: [facility];  Service: Orthopedics;  Laterality: Right;   TENDON REPAIR Left 03/21/2017   Left hand    TONSILLECTOMY      There were no vitals filed for this visit.                                    PT Long Term Goals - 09/01/21 1429       PT LONG TERM GOAL #1   Title Pt will be independent in HEP    Status On-going    Target Date 10/13/21      PT LONG TERM GOAL #2   Title Pt will improve Rt shoulder ROM to 140 flexion and 40 ER as allowed per protocol    Status Achieved      PT LONG TERM GOAL #3   Title Pt will improve Rt shoulder strength to 3/5 as allowed by protocol    Status On-going    Target Date 10/13/21      PT LONG TERM GOAL #4   Title Pt will improve abdcution ROM to 160 to return to full functional mobility    Time 6    Period Weeks    Status New    Target Date 10/13/21      PT LONG TERM GOAL #5   Title  Pt will be able to lift grandaughter without Rt shoulder pain    Time 6    Period Weeks    Status New    Target Date 10/13/21                    Patient will benefit from skilled therapeutic intervention in order to improve the following deficits and impairments:     Visit Diagnosis: Acute pain of right shoulder  Muscle weakness (generalized)  Abnormal posture     Problem List Patient Active Problem List   Diagnosis Date Noted   Pain in right knee 08/04/2021   Olecranon bursitis, right elbow 08/04/2021   Complete tear of right rotator cuff 07/14/2021   Impingement syndrome of right shoulder 06/17/2021   Major depressive disorder, recurrent episode, moderate (HCC) 03/06/2021   Pain in left knee 11/04/2017   Benign paroxysmal positional vertigo of left ear 05/15/2015   Family history of breast cancer in mother 08/02/2011    Abigail Wright, PT, MPH  09/24/2021, 10:59 AM  [facility] [address] [place] [address] [place], [place], [address] Phone: [phone]   Fax:  [phone]  Name: Abigail Wright MRN: [id number] Date of Birth: 22-Mar-1965

## 2021-09-29 ENCOUNTER — Encounter: Payer: Self-pay | Admitting: Rehabilitative and Restorative Service Providers"

## 2021-09-29 ENCOUNTER — Ambulatory Visit: Payer: No Typology Code available for payment source | Admitting: Rehabilitative and Restorative Service Providers"

## 2021-09-29 DIAGNOSIS — M6281 Muscle weakness (generalized): Secondary | ICD-10-CM

## 2021-09-29 DIAGNOSIS — R293 Abnormal posture: Secondary | ICD-10-CM

## 2021-09-29 DIAGNOSIS — M25511 Pain in right shoulder: Secondary | ICD-10-CM

## 2021-09-29 NOTE — Therapy (Signed)
[facility] [place] [address] [place] [address] [place], [place], [address] Phone: [phone]   Fax:  [phone]  Physical Therapy Treatment Rationale for Evaluation and Treatment Rehabilitation  Patient Details  Name: Abigail Wright MRN: [id number] Date of Birth: [birth year] Referring Provider (PT): Cleophas Dunker   Encounter Date: 09/29/2021   PT End of Session - 09/29/21 1058     Visit Number 16    Number of Visits 21    Date for PT Re-Evaluation 10/13/21    PT Start Time 1056    PT Stop Time 1145    PT Time Calculation (min) 49 min    Activity Tolerance Patient tolerated treatment well             Past Medical History:  Diagnosis Date   ADD (attention deficit disorder)    Anxiety    Arthritis    Blood transfusion without reported diagnosis    1993   Depression    GERD (gastroesophageal reflux disease)    in past but recently controlled with diet and exercise.   Hyperlipidemia     Past Surgical History:  Procedure Laterality Date   ABDOMINAL HYSTERECTOMY     BUNIONECTOMY Left    CARPAL TUNNEL RELEASE Bilateral    cyst removal from left hand Left    FOOT SURGERY     KNEE CARTILAGE SURGERY     planters fascitis     SHOULDER ARTHROSCOPY WITH SUBACROMIAL DECOMPRESSION AND OPEN ROTATOR C Right 07/07/2021   Procedure: RIGHT SHOULDER ARTHROSCOPY WITH SUBACROMIAL DECOMPRESSION / ROTATOR CUFF REPAIR/ DISTAL CLAVICLE RESECTION /BICEPS TENODESIS;  Surgeon: Valeria Batman, MD;  Location: [facility];  Service: Orthopedics;  Laterality: Right;   TENDON REPAIR Left 03/21/2017   Left hand    TONSILLECTOMY      There were no vitals filed for this visit.   Subjective Assessment - 09/29/21 1059     Subjective No pain in the shoulder. Still some "clicking" in the Lt side of the neck with turning head to the Lt but less frequent and not as loud.    Currently in Pain? No/denies    Pain Score 0-No pain    Pain Location Shoulder                                [facility] Adult PT Treatment/Exercise - 09/29/21 0001       Neck Exercises: Standing   Neck Retraction 5 reps;5 secs    Neck Retraction Limitations back at wall      Neck Exercises: Seated   Neck Retraction 5 reps;5 secs    Lateral Flexion Right;Left;5 reps    Lateral Flexion Limitations with chin tuck      Shoulder Exercises: Supine   Other Supine Exercises scap squeeze 10 sec x 10 reps      Shoulder Exercises: Seated   Retraction 20 reps      Shoulder Exercises: Pulleys   Flexion Limitations 10 sec hold x 10 reps    Scaption Limitations 10 sec hold x 10 reps    Other Pulley Exercises horizontal abd/add x 10      Shoulder Exercises: Stretch   Other Shoulder Stretches bicep stretch against doorframe 2x30 sec    Other Shoulder Stretches doorway stretch 3 positions and shoulder flexion with dowel x 30 sec x 2 reps each      Moist Heat Therapy   Number Minutes Moist Heat 10 Minutes  Moist Heat Location Cervical;Shoulder      Manual Therapy   Manual therapy comments deep tissue work through teres/lats into the anterior chest/pecs and deltoid    Soft tissue mobilization soft tissue mobilization Rt pecs/deltoid/biceps areas    Myofascial Release anterior arm/biceps/deltoid area    Passive ROM PROM with disassociation of scapula from [facility] joint movement for shoulder flexion and scaption; shoulder extension; horizontal abduction pt supine working within pt tolerance and tissue limits              Trigger Point Dry Needling - 09/29/21 0001     Consent Given? Yes    Education Handout Provided Previously provided    Muscles Treated Head and Neck Upper trapezius;Scalenes;Splenius capitus;Semispinalis capitus;Cervical multifidi    Other Dry Needling Rt    Upper Trapezius Response Palpable increased muscle length    Scalenes Response Palpable increased muscle length    Splenius capitus Response Palpable increased muscle length    Semispinalis  capitus Response Palpable increased muscle length    Cervical multifidi Response Palpable increased muscle length                        PT Long Term Goals - 09/01/21 1429       PT LONG TERM GOAL #1   Title Pt will be independent in HEP    Status On-going    Target Date 10/13/21      PT LONG TERM GOAL #2   Title Pt will improve Rt shoulder ROM to 140 flexion and 40 ER as allowed per protocol    Status Achieved      PT LONG TERM GOAL #3   Title Pt will improve Rt shoulder strength to 3/5 as allowed by protocol    Status On-going    Target Date 10/13/21      PT LONG TERM GOAL #4   Title Pt will improve abdcution ROM to 160 to return to full functional mobility    Time 6    Period Weeks    Status New    Target Date 10/13/21      PT LONG TERM GOAL #5   Title Pt will be able to lift grandaughter without Rt shoulder pain    Time 6    Period Weeks    Status New    Target Date 10/13/21                   Plan - 09/29/21 1155     Clinical Impression Statement Patient reports decreased pain in the Rt shoulder and neck. She has some continued popping and clicking in the Rt cervical spine with Lt rotation but that is improving. Note continued muscular tightness Rt cervical spine. Good response to DN and manual work followed by stretching. Minimal to no "clicking " noted with Lt rotation post treatment.    Rehab Potential Good    PT Frequency 2x / week    PT Duration 6 weeks    PT Treatment/Interventions Aquatic Therapy;Cryotherapy;Electrical Stimulation;Iontophoresis 4mg /ml Dexamethasone;Moist Heat;Balance training;Neuromuscular re-education;Therapeutic exercise;Therapeutic activities;Patient/family education;Manual techniques;Passive range of motion;Dry needling;Taping;Vasopneumatic Device    PT Next Visit Plan isometrics, pulleys, AAROM, progress with strengthening per protocol    PT Home Exercise Plan [email or web address]    Consulted and Agree with Plan of Care  Patient             Patient will benefit from skilled therapeutic intervention in order to improve the following deficits and impairments:  Visit Diagnosis: Acute pain of right shoulder  Muscle weakness (generalized)  Abnormal posture     Problem List Patient Active Problem List   Diagnosis Date Noted   Pain in right knee 08/04/2021   Olecranon bursitis, right elbow 08/04/2021   Complete tear of right rotator cuff 07/14/2021   Impingement syndrome of right shoulder 06/17/2021   Major depressive disorder, recurrent episode, moderate (HCC) 03/06/2021   Pain in left knee 11/04/2017   Benign paroxysmal positional vertigo of left ear 05/15/2015   Family history of breast cancer in mother 08/02/2011    Val Riles, PT, MPH  09/29/2021, 12:01 PM  [facility] [address] [place] [address] [place], [place], [address] Phone: [phone]   Fax:  [phone]  Name: Manasvini Whatley MRN: [id number] Date of Birth: [birth year]

## 2021-10-01 ENCOUNTER — Encounter: Payer: Self-pay | Admitting: Rehabilitative and Restorative Service Providers"

## 2021-10-01 ENCOUNTER — Ambulatory Visit: Payer: No Typology Code available for payment source | Admitting: Rehabilitative and Restorative Service Providers"

## 2021-10-01 DIAGNOSIS — M25511 Pain in right shoulder: Secondary | ICD-10-CM | POA: Diagnosis not present

## 2021-10-01 DIAGNOSIS — R293 Abnormal posture: Secondary | ICD-10-CM

## 2021-10-01 DIAGNOSIS — M6281 Muscle weakness (generalized): Secondary | ICD-10-CM

## 2021-10-01 NOTE — Patient Instructions (Signed)
Access Code: [email or web address] URL: [email or web address] Date: 10/01/2021 Prepared by: Corlis Leak  Exercises - Standing Infraspinatus/Teres Minor Release with Ball at [facility]  - 2 x daily - 7 x weekly - Shoulder External Rotation and Scapular Retraction with Resistance  - 1 x daily - 7 x weekly - 2 sets - 10 reps - Shoulder W - External Rotation with Resistance  - 1 x daily - 7 x weekly - 2 sets - 10 reps - Standing shoulder flexion wall slides  - 1 x daily - 7 x weekly - 1 sets - 5-10 reps - 2-3 sec  hold - Standing Upper Trapezius Stretch  - 2-3 x daily - 7 x weekly - 1 sets - 3 reps - 10 sec  hold - Supine Cervical Retraction with Towel  - 2 x daily - 7 x weekly - 1 sets - 5-10 reps - 10 sec  hold - Doorway Pec Stretch at 60 Degrees Abduction  - 3 x daily - 7 x weekly - 1 sets - 3 reps - Doorway Pec Stretch at 90 Degrees Abduction  - 3 x daily - 7 x weekly - 1 sets - 3 reps - 30 seconds  hold - Doorway Pec Stretch at 120 Degrees Abduction  - 3 x daily - 7 x weekly - 1 sets - 3 reps - 30 second hold  hold - Prone Scapular Retraction  - 2 x daily - 7 x weekly - 1 sets - 5-10 reps - 3-5 sec  hold - Standing Bilateral Low Shoulder Row with Anchored Resistance  - 2 x daily - 7 x weekly - 1-3 sets - 10 reps - 2-3 sec  hold - Drawing Bow  - 1 x daily - 7 x weekly - 1 sets - 10 reps - 3 sec  hold - Standing Lat Pull Down with Resistance - Elbows Bent  - 2 x daily - 7 x weekly - 1 sets - 10 reps - 3 sec  hold - Shoulder External Rotation with Anchored Resistance  - 2 x daily - 7 x weekly - 1-3 sets - 10 reps - 3 sec  hold - Shoulder External Rotation Reactive Isometrics  - 1 x daily - 7 x weekly - 1-3 sets - 5-10 reps - 10-30 sec  hold - Shoulder Internal Rotation with Resistance  - 1 x daily - 7 x weekly - 1-3 sets - 10 reps - 2-3 sec  hold - Shoulder Internal Rotation Reactive Isometrics  - 1 x daily - 7 x weekly - 1-3 sets - 10 reps - 3-5 sec  hold - Wall Push Up  - 1 x daily - 7 x weekly -  1-3 sets - 10 reps - 2-3 sec  hold - Plank on Counter  - 2 x daily - 7 x weekly - 1 sets - 3 reps - 30-60 sec  hold

## 2021-10-01 NOTE — Therapy (Signed)
[facility] [place] [address] [place], [place], [address] Phone: [phone]   Fax:  [phone]  Physical Therapy Treatment Rationale for Evaluation and Treatment Rehabilitation  Patient Details  Name: Abigail Wright MRN: [id number] Date of Birth: 12-20-1965 Referring Provider (PT): Cleophas Dunker   Encounter Date: 10/01/2021   PT End of Session - 10/01/21 1049     Visit Number 17    Number of Visits 21    Date for PT Re-Evaluation 10/13/21    PT Start Time 1048    PT Stop Time 1130    PT Time Calculation (min) 42 min    Activity Tolerance Patient tolerated treatment well             Past Medical History:  Diagnosis Date   ADD (attention deficit disorder)    Anxiety    Arthritis    Blood transfusion without reported diagnosis    1993   Depression    GERD (gastroesophageal reflux disease)    in past but recently controlled with diet and exercise.   Hyperlipidemia     Past Surgical History:  Procedure Laterality Date   ABDOMINAL HYSTERECTOMY     BUNIONECTOMY Left    CARPAL TUNNEL RELEASE Bilateral    cyst removal from left hand Left    FOOT SURGERY     KNEE CARTILAGE SURGERY     planters fascitis     SHOULDER ARTHROSCOPY WITH SUBACROMIAL DECOMPRESSION AND OPEN ROTATOR C Right 07/07/2021   Procedure: RIGHT SHOULDER ARTHROSCOPY WITH SUBACROMIAL DECOMPRESSION / ROTATOR CUFF REPAIR/ DISTAL CLAVICLE RESECTION /BICEPS TENODESIS;  Surgeon: Valeria Batman, MD;  Location: [facility];  Service: Orthopedics;  Laterality: Right;   TENDON REPAIR Left 03/21/2017   Left hand    TONSILLECTOMY      There were no vitals filed for this visit.   Subjective Assessment - 10/01/21 1050     Subjective No pain in the shoulder. No "clicking" in the Lt side of the neck with turning head! Feeling relieved that the noise in the neck is gone. Still feels weak in the Rt UE.    Currently in Pain? No/denies    Pain Score 0-No pain    Pain  Location Shoulder    Pain Orientation Right    Pain Descriptors / Indicators Aching;Sore    Pain Onset More than a month ago    Pain Frequency Constant                               [facility] Adult PT Treatment/Exercise - 10/01/21 0001       Shoulder Exercises: Standing   External Rotation Strengthening;Right;10 reps;Theraband    Theraband Level (Shoulder External Rotation) Level 3 (Green)    External Rotation Limitations instructed in isometric step back green TB ER for HEP    Internal Rotation Strengthening;Right;10 reps;Theraband    Theraband Level (Shoulder Internal Rotation) Level 3 (Green)    Internal Rotation Limitations instructed inisometric step back green TB for HEP    Row Strengthening;Both;20 reps;Theraband    Theraband Level (Shoulder Row) Level 4 (Blue)    Row Limitations bow and arrow blue TB x 10 reps each side    Other Standing Exercises lat pull blue TB x 10 reps      Shoulder Exercises: Pulleys   Flexion Limitations 10 sec hold x 10 reps    Scaption Limitations 10 sec hold x 10 reps  Other Pulley Exercises horizontal abd/add x 10      Shoulder Exercises: ROM/Strengthening   Wall Pushups 10 reps    Wall Pushups Limitations reverse wall push up x 10 reps    Plank 60 seconds;1 rep    Plank Limitations counter plank      Shoulder Exercises: Stretch   Other Shoulder Stretches bicep stretch against doorframe 2x30 sec    Other Shoulder Stretches doorway stretch 3 positions and shoulder flexion with dowel x 30 sec x 2 reps each                          PT Long Term Goals - 09/01/21 1429       PT LONG TERM GOAL #1   Title Pt will be independent in HEP    Status On-going    Target Date 10/13/21      PT LONG TERM GOAL #2   Title Pt will improve Rt shoulder ROM to 140 flexion and 40 ER as allowed per protocol    Status Achieved      PT LONG TERM GOAL #3   Title Pt will improve Rt shoulder strength to 3/5 as allowed by  protocol    Status On-going    Target Date 10/13/21      PT LONG TERM GOAL #4   Title Pt will improve abdcution ROM to 160 to return to full functional mobility    Time 6    Period Weeks    Status New    Target Date 10/13/21      PT LONG TERM GOAL #5   Title Pt will be able to lift grandaughter without Rt shoulder pain    Time 6    Period Weeks    Status New    Target Date 10/13/21                   Plan - 10/01/21 1054     Clinical Impression Statement Resolution of tightness and "clicking" in the neck. She continues to have decreaed strength Rt UE and is substituting use of the Lt UE. Continued working on stabilization and strengthening. Patient will benefit fro mcontinued treatment to progress strengthening including resistive exercises and body weight exercises. Sees MD next week, Wednesday 10/07/21.    Rehab Potential Good    PT Frequency 2x / week    PT Duration 6 weeks    PT Treatment/Interventions Aquatic Therapy;Cryotherapy;Electrical Stimulation;Iontophoresis 4mg /ml Dexamethasone;Moist Heat;Balance training;Neuromuscular re-education;Therapeutic exercise;Therapeutic activities;Patient/family education;Manual techniques;Passive range of motion;Dry needling;Taping;Vasopneumatic Device    PT Next Visit Plan pulleys, ROM, progress strengthening  - note to MD at next visit    PT Home Exercise Plan [id number]    Consulted and Agree with Plan of Care Patient             Patient will benefit from skilled therapeutic intervention in order to improve the following deficits and impairments:     Visit Diagnosis: Acute pain of right shoulder  Muscle weakness (generalized)  Abnormal posture     Problem List Patient Active Problem List   Diagnosis Date Noted   Pain in right knee 08/04/2021   Olecranon bursitis, right elbow 08/04/2021   Complete tear of right rotator cuff 07/14/2021   Impingement syndrome of right shoulder 06/17/2021   Major depressive  disorder, recurrent episode, moderate (HCC) 03/06/2021   Pain in left knee 11/04/2017   Benign paroxysmal positional vertigo of left ear 05/15/2015   Family  history of breast cancer in mother 08/02/2011    Val Riles, PT, MPH  10/01/2021, 11:33 AM  [facility] [address] [place] [address] [place], [place], [address] Phone: [phone]   Fax:  [phone]  Name: Nollie Shiflett MRN: [id number] Date of Birth: 1965/12/27

## 2021-10-06 ENCOUNTER — Ambulatory Visit: Payer: No Typology Code available for payment source | Admitting: Physical Therapy

## 2021-10-06 ENCOUNTER — Encounter: Payer: Self-pay | Admitting: Physical Therapy

## 2021-10-06 DIAGNOSIS — M25511 Pain in right shoulder: Secondary | ICD-10-CM

## 2021-10-06 DIAGNOSIS — M6281 Muscle weakness (generalized): Secondary | ICD-10-CM

## 2021-10-06 DIAGNOSIS — R293 Abnormal posture: Secondary | ICD-10-CM

## 2021-10-06 NOTE — Therapy (Addendum)
[place], [place], [address] Phone: [phone]   Fax:  [phone]  Physical Therapy Treatment and Discharge  Patient Details  Name: Abigail Wright MRN: [id number] Date of Birth: 1966/02/22 Referring Provider (PT): Durward Fortes   Encounter Date: 10/06/2021 Rationale for Evaluation and Treatment Rehabilitation   PT End of Session - 10/06/21 1134     Visit Number 18    Number of Visits 21    Date for PT Re-Evaluation 10/13/21    PT Start Time 1055    PT Stop Time 3810    PT Time Calculation (min) 39 min    Activity Tolerance Patient tolerated treatment well    Behavior During Therapy [facility] for tasks assessed/performed             Past Medical History:  Diagnosis Date   ADD (attention deficit disorder)    Anxiety    Arthritis    Blood transfusion without reported diagnosis    1993   Depression    GERD (gastroesophageal reflux disease)    in past but recently controlled with diet and exercise.   Hyperlipidemia     Past Surgical History:  Procedure Laterality Date   ABDOMINAL HYSTERECTOMY     BUNIONECTOMY Left    CARPAL TUNNEL RELEASE Bilateral    cyst removal from left hand Left    FOOT SURGERY     KNEE CARTILAGE SURGERY     planters fascitis     SHOULDER ARTHROSCOPY WITH SUBACROMIAL DECOMPRESSION AND OPEN ROTATOR C Right 07/07/2021   Procedure: RIGHT SHOULDER ARTHROSCOPY WITH SUBACROMIAL DECOMPRESSION / ROTATOR CUFF REPAIR/ DISTAL CLAVICLE RESECTION /BICEPS TENODESIS;  Surgeon: Garald Balding, MD;  Location: [facility];  Service: Orthopedics;  Laterality: Right;   TENDON REPAIR Left 03/21/2017   Left hand    TONSILLECTOMY      There were no vitals filed for this visit.   Subjective Assessment - 10/06/21 1059     Subjective Pt states she has no pain. no complaints. Returns to MD next week    Patient Stated Goals get use of my arm back    Currently in Pain? No/denies                 [facility] PT Assessment - 10/06/21 0001       Assessment   Medical Diagnosis Rt RTC repair with biceps tenodesis    Referring Provider (PT) [place]    Onset Date/Surgical Date 07/07/21    Hand Dominance Right    Next MD Visit 09/2021      PROM   Right Shoulder Flexion 180 Degrees    Right Shoulder ABduction 156 Degrees    Right Shoulder Internal Rotation 81 Degrees    Right Shoulder External Rotation 77 Degrees                           [facility] Adult PT Treatment/Exercise - 10/06/21 0001       Shoulder Exercises: Standing   External Rotation 10 reps    Theraband Level (Shoulder External Rotation) Level 3 (Green)    External Rotation Limitations isometric    Internal Rotation 10 reps    Theraband Level (Shoulder Internal Rotation) Level 3 (Green)    Internal Rotation Limitations isometric    Row 20 reps    Theraband Level (Shoulder Row) Level 4 (Blue)    Row Limitations bow and arrow blue TB 2 x 10 reps each side  Other Standing Exercises lat pull blue TB 2 x 10 reps      Shoulder Exercises: Pulleys   Flexion Limitations 10 sec hold x 10 reps    Scaption Limitations 10 sec hold x 10 reps    Other Pulley Exercises horizontal abd/add x 10      Shoulder Exercises: ROM/Strengthening   Wall Pushups 10 reps    Wall Pushups Limitations reverse wall push ups    Plank 60 seconds;1 rep    Plank Limitations counter plank      Shoulder Exercises: Stretch   Other Shoulder Stretches doorway stretch 3 positions and shoulder flexion with dowel x 30 sec x 2 reps each                          PT Long Term Goals - 10/06/21 1127       PT LONG TERM GOAL #1   Title Pt will be independent in HEP    Status On-going      PT LONG TERM GOAL #2   Title Pt will improve Rt shoulder ROM to 140 flexion and 40 ER as allowed per protocol    Status Achieved      PT LONG TERM GOAL #3   Title Pt will improve Rt shoulder strength to 3/5 as allowed by  protocol    Status Achieved      PT LONG TERM GOAL #4   Title Pt will improve abdcution ROM to 160 to return to full functional mobility    Status Achieved      PT LONG TERM GOAL #5   Title Pt will be able to lift grandaughter without Rt shoulder pain    Status Achieved                   Plan - 10/06/21 1133     Clinical Impression Statement Pt continues to progress well. Progressing well towards LTGs. She hopes to d/c after next visit    PT Next Visit Plan pulleys, ROM, progress strengthening    PT Home Exercise Plan [id number]    Consulted and Agree with Plan of Care Patient             Patient will benefit from skilled therapeutic intervention in order to improve the following deficits and impairments:     Visit Diagnosis: Acute pain of right shoulder  Muscle weakness (generalized)  Abnormal posture     Problem List Patient Active Problem List   Diagnosis Date Noted   Pain in right knee 08/04/2021   Olecranon bursitis, right elbow 08/04/2021   Complete tear of right rotator cuff 07/14/2021   Impingement syndrome of right shoulder 06/17/2021   Major depressive disorder, recurrent episode, moderate ([place]) 03/06/2021   Pain in left knee 11/04/2017   Benign paroxysmal positional vertigo of left ear 05/15/2015   Family history of breast cancer in mother 08/02/2011   PHYSICAL THERAPY DISCHARGE SUMMARY  Visits from Start of Care: 18  Current functional level related to goals / functional outcomes: Improved ROM, strength and functional mobility   Remaining deficits: See above   Education / Equipment: HEP   Patient agrees to discharge. Patient goals were met. Patient is being discharged due to being pleased with the current functional level.  Isabelle Course, PT,DPT09/20/239:03 AM  Isabelle Course, PT 10/06/2021, 11:35 AM  [facility] [place], [place], [address] Phone:  [phone]   Fax:  [phone]  Name: Abigail Wright MRN: [id number] Date of Birth: [birth year]

## 2021-10-08 ENCOUNTER — Ambulatory Visit: Payer: No Typology Code available for payment source | Admitting: Rehabilitative and Restorative Service Providers"

## 2021-10-15 ENCOUNTER — Ambulatory Visit (INDEPENDENT_AMBULATORY_CARE_PROVIDER_SITE_OTHER): Payer: No Typology Code available for payment source | Admitting: Orthopaedic Surgery

## 2021-10-15 ENCOUNTER — Encounter: Payer: Self-pay | Admitting: Orthopaedic Surgery

## 2021-10-15 DIAGNOSIS — M7541 Impingement syndrome of right shoulder: Secondary | ICD-10-CM

## 2021-10-15 DIAGNOSIS — M25562 Pain in left knee: Secondary | ICD-10-CM

## 2021-10-15 MED ORDER — BUPIVACAINE HCL 0.25 % IJ SOLN
2.0000 mL | INTRAMUSCULAR | Status: AC | PRN
  Administered 2021-10-15: 2 mL via INTRA_ARTICULAR

## 2021-10-15 MED ORDER — METHYLPREDNISOLONE ACETATE 40 MG/ML IJ SUSP
80.0000 mg | INTRAMUSCULAR | Status: AC | PRN
  Administered 2021-10-15: 80 mg via INTRA_ARTICULAR

## 2021-10-15 MED ORDER — LIDOCAINE HCL 1 % IJ SOLN
2.0000 mL | INTRAMUSCULAR | Status: AC | PRN
  Administered 2021-10-15: 2 mL

## 2021-10-15 NOTE — Progress Notes (Signed)
Office Visit Note   Patient: Abigail Wright           Date of Birth: 1965/05/12           MRN: [id number] Visit Date: 10/15/2021              Requested by: Sharlene Dory, DO [address] [place],  [place] [address] PCP: Sharlene Dory, DO   Assessment & Plan: Visit Diagnoses:  1. Left knee pain, unspecified chronicity   2. Impingement syndrome of right shoulder     Plan: Patient is now 14 weeks status post right shoulder arthroscopy subacromial decompression rotator cuff repair.  She is doing extremely well.  Should she still has some aching at night that can wake her up.  She feels both her range of motion and strength are improving.  At this point she could discontinue physical therapy and simply do her home exercise program.  Her strength will continue to improve.  She also is complaining of left knee pain.  She did have a history of an arthroscopy.  At her last visit she did have a injection into her right knee.  She thinks maybe she has been relying on the left knee for a while.  Has a little swelling and it offered her a cortisone injection went forward with that today  Follow-Up Instructions: As needed  Orders:  No orders of the defined types were placed in this encounter.  No orders of the defined types were placed in this encounter.     Procedures: Large Joint Inj: L knee on 10/15/2021 1:17 PM Indications: pain and diagnostic evaluation Details: 25 G 1.5 in needle, anteromedial approach  Arthrogram: No  Medications: 80 mg methylPREDNISolone acetate 40 MG/ML; 2 mL lidocaine 1 %; 2 mL bupivacaine 0.25 % Outcome: tolerated well, no immediate complications Procedure, treatment alternatives, risks and benefits explained, specific risks discussed. Consent was given by the patient.     Clinical Data: No additional findings.   Subjective: Chief Complaint  Patient presents with   Right Shoulder - Follow-up   Patient presents today  for follow up of her right shoulder pain. She states that pain has gradually getting better. Reports that she has been attending physical therapy twice a week which she feels like has a lot to do with the decrease in her pain. OTC tylenol and ibuprofen is being used for pain.  Review of Systems  All other systems reviewed and are negative.    Objective: Vital Signs: There were no vitals taken for this visit.  Physical Exam Constitutional:      Appearance: Normal appearance.  Pulmonary:     Effort: Pulmonary effort is normal.  Neurological:     Mental Status: She is alert.     Ortho Exam right shoulder with well-healed incisions.  Negative impingement.  Negative Speed sign and negative empty can testing.  Able to quickly place her arm fully over her head neurologically intact.  Left knee with some tenderness along the anterior medial joint line associated with some patella crepitation but no effusion.  Knee was not hot warm or red.  No popliteal pain or calf discomfort.  Straight leg raise negative and painless range of motion right hip  Specialty Comments:  No specialty comments available.  Imaging: No results found.   PMFS History: Patient Active Problem List   Diagnosis Date Noted   Pain in right knee 08/04/2021   Olecranon bursitis, right elbow 08/04/2021  Complete tear of right rotator cuff 07/14/2021   Impingement syndrome of right shoulder 06/17/2021   Major depressive disorder, recurrent episode, moderate (HCC) 03/06/2021   Pain in left knee 11/04/2017   Benign paroxysmal positional vertigo of left ear 05/15/2015   Family history of breast cancer in mother 08/02/2011   Past Medical History:  Diagnosis Date   ADD (attention deficit disorder)    Anxiety    Arthritis    Blood transfusion without reported diagnosis    1993   Depression    GERD (gastroesophageal reflux disease)    in past but recently controlled with diet and exercise.   Hyperlipidemia      Family History  Problem Relation Age of Onset   Cancer Mother    Diabetes Father    Heart disease Paternal Uncle    Hypertension Paternal Uncle    Colon cancer Neg Hx    Esophageal cancer Neg Hx    Rectal cancer Neg Hx    Stomach cancer Neg Hx     Past Surgical History:  Procedure Laterality Date   ABDOMINAL HYSTERECTOMY     BUNIONECTOMY Left    CARPAL TUNNEL RELEASE Bilateral    cyst removal from left hand Left    FOOT SURGERY     KNEE CARTILAGE SURGERY     planters fascitis     SHOULDER ARTHROSCOPY WITH SUBACROMIAL DECOMPRESSION AND OPEN ROTATOR C Right 07/07/2021   Procedure: RIGHT SHOULDER ARTHROSCOPY WITH SUBACROMIAL DECOMPRESSION / ROTATOR CUFF REPAIR/ DISTAL CLAVICLE RESECTION /BICEPS TENODESIS;  Surgeon: Valeria Batman, MD;  Location: [facility];  Service: Orthopedics;  Laterality: Right;   TENDON REPAIR Left 03/21/2017   Left hand    TONSILLECTOMY     Social History   Occupational History   Not on file  Tobacco Use   Smoking status: Former   Smokeless tobacco: Never  Vaping Use   Vaping Use: Never used  Substance and Sexual Activity   Alcohol use: Never   Drug use: No   Sexual activity: Yes    Partners: Male    Birth control/protection: None, Post-menopausal

## 2021-11-19 ENCOUNTER — Other Ambulatory Visit: Payer: Self-pay | Admitting: Adult Health

## 2021-12-03 ENCOUNTER — Encounter: Payer: Self-pay | Admitting: Adult Health

## 2021-12-03 ENCOUNTER — Telehealth (INDEPENDENT_AMBULATORY_CARE_PROVIDER_SITE_OTHER): Payer: Self-pay | Admitting: Adult Health

## 2021-12-03 DIAGNOSIS — F411 Generalized anxiety disorder: Secondary | ICD-10-CM

## 2021-12-03 DIAGNOSIS — F909 Attention-deficit hyperactivity disorder, unspecified type: Secondary | ICD-10-CM

## 2021-12-03 DIAGNOSIS — F331 Major depressive disorder, recurrent, moderate: Secondary | ICD-10-CM

## 2021-12-03 MED ORDER — AMPHETAMINE-DEXTROAMPHETAMINE 30 MG PO TABS: 30.0000 mg | ORAL_TABLET | Freq: Two times a day (BID) | ORAL | 0 refills | Status: DC

## 2021-12-03 MED ORDER — BUPROPION HCL ER (XL) 300 MG PO TB24: 300.0000 mg | ORAL_TABLET | Freq: Every day | ORAL | 3 refills | Status: DC

## 2021-12-03 MED ORDER — AMPHETAMINE-DEXTROAMPHETAMINE 30 MG PO TABS: 1.0000 | ORAL_TABLET | Freq: Two times a day (BID) | ORAL | 0 refills | Status: DC

## 2021-12-03 MED ORDER — CLONAZEPAM 0.5 MG PO TABS: 0.5000 mg | ORAL_TABLET | Freq: Every day | ORAL | 2 refills | Status: DC

## 2021-12-03 MED ORDER — CITALOPRAM HYDROBROMIDE 20 MG PO TABS: ORAL_TABLET | ORAL | 3 refills | Status: DC

## 2021-12-03 NOTE — Progress Notes (Signed)
Abigail Wright [id number] 1965/09/28 56 y.o.  Virtual Visit via Video Note  I connected with pt @ on 12/03/21 at 12:00 PM EDT by a video enabled telemedicine application and verified that I am speaking with the correct person using two identifiers.   I discussed the limitations of evaluation and management by telemedicine and the availability of in person appointments. The patient expressed understanding and agreed to proceed.  I discussed the assessment and treatment plan with the patient. The patient was provided an opportunity to ask questions and all were answered. The patient agreed with the plan and demonstrated an understanding of the instructions.   The patient was advised to call back or seek an in-person evaluation if the symptoms worsen or if the condition fails to improve as anticipated.  I provided 15 minutes of non-face-to-face time during this encounter.  The patient was located at home.  The provider was located at [facility].   Dorothyann Gibbs, NP   Subjective:   Patient ID:  Abigail Wright is a 56 y.o. (DOB [birth year]) female.  Chief Complaint: No chief complaint on file.   HPI Abigail Wright presents for follow-up of anxiety, ADHD and depression.  Describes mood today as "ok". Pleasant. Denies tearfulness. Mood symptoms - denies depression, anxiety and irritability. Mood is consistent. Stating "I'm doing good". Feels like medications are working well. Family doing well. Stable interest and motivation. Taking medications as prescribed.  Energy levels stable. Active, does not have a regular exercise routine.  Enjoys some usual interests and activities. Married. Lives with husband of 33 years. Spending time with family - 2 daughters - 2 granddaughters. Appetite adequate. Weight stable.  Sleeps well most nights. Averages 7 to 8  hours.  Focus and concentration stable. Completing tasks. Managing aspects of household. 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    Denies SI or HI.  Denies AH or VH.   Review of Systems:  Review of Systems  Musculoskeletal:  Negative for gait problem.  Neurological:  Negative for tremors.  Psychiatric/Behavioral:         Please refer to HPI    Medications: I have reviewed the patient's current medications.  Current Outpatient Medications  Medication Sig Dispense Refill   acetaminophen (TYLENOL) 325 MG tablet Take 325 mg by mouth every 6 (six) hours as needed for moderate pain.     amphetamine-dextroamphetamine (ADDERALL) 30 MG tablet Take 1 tablet by mouth 2 (two) times daily. 60 tablet 0   [START ON 12/31/2021] amphetamine-dextroamphetamine (ADDERALL) 30 MG tablet Take 1 tablet by mouth 2 (two) times daily. 60 tablet 0   [START ON 01/28/2022] amphetamine-dextroamphetamine (ADDERALL) 30 MG tablet Take 1 tablet by mouth 2 (two) times daily. 60 tablet 0   buPROPion (WELLBUTRIN XL) 300 MG 24 hr tablet Take 1 tablet (300 mg total) by mouth daily. 90 tablet 3   cetirizine (ZYRTEC) 10 MG tablet Take 10 mg by mouth daily.     citalopram (CELEXA) 20 MG tablet TAKE 1 TABLET BY MOUTH EACH DAY 90 tablet 3   clonazePAM (KLONOPIN) 0.5 MG tablet Take 1 tablet (0.5 mg total) by mouth at bedtime. 90 tablet 2   diclofenac Sodium (VOLTAREN) 1 % GEL Apply 1 application. topically 3 (three) times daily as needed (pain).     fluticasone (FLONASE) 50 MCG/ACT nasal spray Place 1 spray into both nostrils daily.     Multiple Vitamins-Minerals (MULTIVITAMIN ADULT EXTRA C PO) Take 1 tablet by mouth daily.     oxyCODONE-acetaminophen (PERCOCET/ROXICET)  5-325 MG tablet Take 1 tablet by mouth every 4 (four) hours as needed for severe pain. 30 tablet 0   No current facility-administered medications for this visit.    Medication Side Effects: None  Allergies: No Known Allergies  Past Medical History:  Diagnosis Date   ADD (attention deficit disorder)    Anxiety    Arthritis    Blood transfusion without reported diagnosis    1993    Depression    GERD (gastroesophageal reflux disease)    in past but recently controlled with diet and exercise.   Hyperlipidemia     Family History  Problem Relation Age of Onset   Cancer Mother    Diabetes Father    Heart disease Paternal Uncle    Hypertension Paternal Uncle    Colon cancer Neg Hx    Esophageal cancer Neg Hx    Rectal cancer Neg Hx    Stomach cancer Neg Hx     Social History   Socioeconomic History   Marital status: Married    Spouse name: Not on file   Number of children: Not on file   Years of education: Not on file   Highest education level: Not on file  Occupational History   Not on file  Tobacco Use   Smoking status: Former   Smokeless tobacco: Never  Vaping Use   Vaping Use: Never used  Substance and Sexual Activity   Alcohol use: Never   Drug use: No   Sexual activity: Yes    Partners: Male    Birth control/protection: None, Post-menopausal  Other Topics Concern   Not on file  Social History Narrative   Not on file   Social Determinants of Health   Financial Resource Strain: Not on file  Food Insecurity: Not on file  Transportation Needs: Not on file  Physical Activity: Not on file  Stress: Not on file  Social Connections: Not on file  Intimate Partner Violence: Not on file    Past Medical History, Surgical history, Social history, and Family history were reviewed and updated as appropriate.   Please see review of systems for further details on the patient's review from today.   Objective:   Physical Exam:  There were no vitals taken for this visit.  Physical Exam Constitutional:      General: She is not in acute distress. Musculoskeletal:        General: No deformity.  Neurological:     Mental Status: She is alert and oriented to person, place, and time.     Coordination: Coordination normal.  Psychiatric:        Attention and Perception: Attention and perception normal. She does not perceive auditory or visual  hallucinations.        Mood and Affect: Mood normal. Mood is not anxious or depressed. Affect is not labile, blunt, angry or inappropriate.        Speech: Speech normal.        Behavior: Behavior normal.        Thought Content: Thought content normal. Thought content is not paranoid or delusional. Thought content does not include homicidal or suicidal ideation. Thought content does not include homicidal or suicidal plan.        Cognition and Memory: Cognition and memory normal.        Judgment: Judgment normal.     Comments: Insight intact     Lab Review:     Component Value Date/Time   NA 138 03/13/2021 1110  K 4.2 03/13/2021 1110   CL 101 03/13/2021 1110   CO2 31 03/13/2021 1110   GLUCOSE 118 (H) 03/13/2021 1110   BUN 15 03/13/2021 1110   CREATININE 0.95 03/13/2021 1110   CALCIUM 9.8 03/13/2021 1110   PROT 7.3 03/10/2021 1039   ALBUMIN 4.6 03/10/2021 1039   AST 18 03/10/2021 1039   ALT 24 03/10/2021 1039   ALKPHOS 76 03/10/2021 1039   BILITOT 0.7 03/10/2021 1039       Component Value Date/Time   WBC 7.9 06/30/2021 1455   RBC 5.02 06/30/2021 1455   HGB 14.3 06/30/2021 1455   HCT 44.3 06/30/2021 1455   PLT 275 06/30/2021 1455   MCV 88.2 06/30/2021 1455   MCH 28.5 06/30/2021 1455   MCHC 32.3 06/30/2021 1455   RDW 13.3 06/30/2021 1455    No results found for: "POCLITH", "LITHIUM"   No results found for: "PHENYTOIN", "PHENOBARB", "VALPROATE", "CBMZ"   .res Assessment: Plan:    Plan:  1. Adderall 30mg  BID 2. Celexa 20 mg daily 3. Wellbutrin XL 300mg  4. Clonazepam 0.5mg  daily   Continue to monitor BP - WNL  RTC 3 months  Patient advised to contact office with any questions, adverse effects, or acute worsening in signs and symptoms.  Discussed potential benefits, risk, and side effects of benzodiazepines to include potential risk of tolerance and dependence, as well as possible drowsiness.  Advised patient not to drive if experiencing drowsiness and to take  lowest possible effective dose to minimize risk of dependence and tolerance.  Discussed potential benefits, risks, and side effects of stimulants with patient to include increased heart rate, palpitations, insomnia, increased anxiety, increased irritability, or decreased appetite.  Instructed patient to contact office if experiencing any significant tolerability issues.  Diagnoses and all orders for this visit:  Attention deficit hyperactivity disorder (ADHD), unspecified ADHD type  Major depressive disorder, recurrent episode, moderate (HCC) -     amphetamine-dextroamphetamine (ADDERALL) 30 MG tablet; Take 1 tablet by mouth 2 (two) times daily. -     amphetamine-dextroamphetamine (ADDERALL) 30 MG tablet; Take 1 tablet by mouth 2 (two) times daily. -     amphetamine-dextroamphetamine (ADDERALL) 30 MG tablet; Take 1 tablet by mouth 2 (two) times daily. -     buPROPion (WELLBUTRIN XL) 300 MG 24 hr tablet; Take 1 tablet (300 mg total) by mouth daily. -     citalopram (CELEXA) 20 MG tablet; TAKE 1 TABLET BY MOUTH EACH DAY  Generalized anxiety disorder -     citalopram (CELEXA) 20 MG tablet; TAKE 1 TABLET BY MOUTH EACH DAY -     clonazePAM (KLONOPIN) 0.5 MG tablet; Take 1 tablet (0.5 mg total) by mouth at bedtime.     Please see After Visit Summary for patient specific instructions.  No future appointments.  No orders of the defined types were placed in this encounter.     -------------------------------

## 2021-12-29 ENCOUNTER — Ambulatory Visit: Payer: No Typology Code available for payment source | Admitting: Orthopaedic Surgery

## 2022-01-05 ENCOUNTER — Encounter: Payer: Self-pay | Admitting: Orthopaedic Surgery

## 2022-01-05 ENCOUNTER — Ambulatory Visit (INDEPENDENT_AMBULATORY_CARE_PROVIDER_SITE_OTHER): Payer: No Typology Code available for payment source | Admitting: Orthopaedic Surgery

## 2022-01-05 DIAGNOSIS — M75121 Complete rotator cuff tear or rupture of right shoulder, not specified as traumatic: Secondary | ICD-10-CM

## 2022-01-05 DIAGNOSIS — M25561 Pain in right knee: Secondary | ICD-10-CM | POA: Diagnosis not present
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
## 2022-03-09 ENCOUNTER — Telehealth (INDEPENDENT_AMBULATORY_CARE_PROVIDER_SITE_OTHER): Payer: Self-pay | Admitting: Adult Health

## 2022-03-09 ENCOUNTER — Encounter: Payer: Self-pay | Admitting: Adult Health

## 2022-03-09 DIAGNOSIS — F411 Generalized anxiety disorder: Secondary | ICD-10-CM

## 2022-03-09 DIAGNOSIS — F331 Major depressive disorder, recurrent, moderate: Secondary | ICD-10-CM

## 2022-03-09 DIAGNOSIS — F909 Attention-deficit hyperactivity disorder, unspecified type: Secondary | ICD-10-CM

## 2022-03-09 MED ORDER — AMPHETAMINE-DEXTROAMPHETAMINE 30 MG PO TABS: 30.0000 mg | ORAL_TABLET | Freq: Two times a day (BID) | ORAL | 0 refills | Status: DC

## 2022-03-09 MED ORDER — CLONAZEPAM 0.5 MG PO TABS: 0.5000 mg | ORAL_TABLET | Freq: Every day | ORAL | 2 refills | Status: DC

## 2022-03-09 MED ORDER — AMPHETAMINE-DEXTROAMPHETAMINE 30 MG PO TABS: 1.0000 | ORAL_TABLET | Freq: Two times a day (BID) | ORAL | 0 refills | Status: DC

## 2022-03-09 NOTE — Progress Notes (Signed)
Abigail Wright [id number] [birth year] 57 y.o.  Virtual Visit via Video Note  I connected with pt @ on 03/09/22 at  1:00 PM EST by a video enabled telemedicine application and verified that I am speaking with the correct person using two identifiers.   I discussed the limitations of evaluation and management by telemedicine and the availability of in person appointments. The patient expressed understanding and agreed to proceed.  I discussed the assessment and treatment plan with the patient. The patient was provided an opportunity to ask questions and all were answered. The patient agreed with the plan and demonstrated an understanding of the instructions.   The patient was advised to call back or seek an in-person evaluation if the symptoms worsen or if the condition fails to improve as anticipated.  I provided 15 minutes of non-face-to-face time during this encounter.  The patient was located at home.  The provider was located at [facility].   Dorothyann Gibbs, NP   Subjective:   Patient ID:  [name] is a 57 y.o. (DOB September 03, 1965) female.  Chief Complaint: No chief complaint on file.   HPI Kaylla Terwilliger presents for follow-up of anxiety, ADHD and depression.  Describes mood today as "ok". Pleasant. Denies tearfulness. Mood symptoms - denies depression, anxiety and irritability. Mood is consistent. Stating "I'm doing well - happy". Feels like medications are working well. Family doing well. Stable interest and motivation. Taking medications as prescribed.  Energy levels stable. Active, does not have a regular exercise routine.  Enjoys some usual interests and activities. Married. Lives with husband of 33 years. Spending time with family - 2 daughters - 2 granddaughters. Appetite adequate. Weight stable.  Sleeps well most nights. Averages 7 to 8  hours.  Focus and concentration stable. Completing tasks. 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    Denies SI or HI.  Denies AH or VH.    Review of Systems:  Review of Systems  Musculoskeletal:  Negative for gait problem.  Neurological:  Negative for tremors.  Psychiatric/Behavioral:         Please refer to HPI    Medications: I have reviewed the patient's current medications.  Current Outpatient Medications  Medication Sig Dispense Refill   acetaminophen (TYLENOL) 325 MG tablet Take 325 mg by mouth every 6 (six) hours as needed for moderate pain.     amphetamine-dextroamphetamine (ADDERALL) 30 MG tablet Take 1 tablet by mouth 2 (two) times daily. 60 tablet 0   amphetamine-dextroamphetamine (ADDERALL) 30 MG tablet Take 1 tablet by mouth 2 (two) times daily. 60 tablet 0   amphetamine-dextroamphetamine (ADDERALL) 30 MG tablet Take 1 tablet by mouth 2 (two) times daily. 60 tablet 0   buPROPion (WELLBUTRIN XL) 300 MG 24 hr tablet Take 1 tablet (300 mg total) by mouth daily. 90 tablet 3   cetirizine (ZYRTEC) 10 MG tablet Take 10 mg by mouth daily.     citalopram (CELEXA) 20 MG tablet TAKE 1 TABLET BY MOUTH EACH DAY 90 tablet 3   clonazePAM (KLONOPIN) 0.5 MG tablet Take 1 tablet (0.5 mg total) by mouth at bedtime. 90 tablet 2   diclofenac Sodium (VOLTAREN) 1 % GEL Apply 1 application. topically 3 (three) times daily as needed (pain).     fluticasone (FLONASE) 50 MCG/ACT nasal spray Place 1 spray into both nostrils daily.     Multiple Vitamins-Minerals (MULTIVITAMIN ADULT EXTRA C PO) Take 1 tablet by mouth daily.     oxyCODONE-acetaminophen (PERCOCET/ROXICET) 5-325 MG  tablet Take 1 tablet by mouth every 4 (four) hours as needed for severe pain. 30 tablet 0   No current facility-administered medications for this visit.    Medication Side Effects: None  Allergies: No Known Allergies  Past Medical History:  Diagnosis Date   ADD (attention deficit disorder)    Anxiety    Arthritis    Blood transfusion without reported diagnosis    1993   Depression    GERD (gastroesophageal  reflux disease)    in past but recently controlled with diet and exercise.   Hyperlipidemia     Family History  Problem Relation Age of Onset   Cancer Mother    Diabetes Father    Heart disease Paternal Uncle    Hypertension Paternal Uncle    Colon cancer Neg Hx    Esophageal cancer Neg Hx    Rectal cancer Neg Hx    Stomach cancer Neg Hx     Social History   Socioeconomic History   Marital status: Married    Spouse name: Not on file   Number of children: Not on file   Years of education: Not on file   Highest education level: Not on file  Occupational History   Not on file  Tobacco Use   Smoking status: Former   Smokeless tobacco: Never  Vaping Use   Vaping Use: Never used  Substance and Sexual Activity   Alcohol use: Never   Drug use: No   Sexual activity: Yes    Partners: Male    Birth control/protection: None, Post-menopausal  Other Topics Concern   Not on file  Social History Narrative   Not on file   Social Determinants of Health   Financial Resource Strain: Not on file  Food Insecurity: Not on file  Transportation Needs: Not on file  Physical Activity: Not on file  Stress: Not on file  Social Connections: Not on file  Intimate Partner Violence: Not on file    Past Medical History, Surgical history, Social history, and Family history were reviewed and updated as appropriate.   Please see review of systems for further details on the patient's review from today.   Objective:   Physical Exam:  There were no vitals taken for this visit.  Physical Exam Constitutional:      General: She is not in acute distress. Musculoskeletal:        General: No deformity.  Neurological:     Mental Status: She is alert and oriented to person, place, and time.     Coordination: Coordination normal.  Psychiatric:        Attention and Perception: Attention and perception normal. She does not perceive auditory or visual hallucinations.        Mood and Affect: Mood  normal. Mood is not anxious or depressed. Affect is not labile, blunt, angry or inappropriate.        Speech: Speech normal.        Behavior: Behavior normal.        Thought Content: Thought content normal. Thought content is not paranoid or delusional. Thought content does not include homicidal or suicidal ideation. Thought content does not include homicidal or suicidal plan.        Cognition and Memory: Cognition and memory normal.        Judgment: Judgment normal.     Comments: Insight intact     Lab Review:     Component Value Date/Time   NA 138 03/13/2021 1110  K 4.2 03/13/2021 1110   CL 101 03/13/2021 1110   CO2 31 03/13/2021 1110   GLUCOSE 118 (H) 03/13/2021 1110   BUN 15 03/13/2021 1110   CREATININE 0.95 03/13/2021 1110   CALCIUM 9.8 03/13/2021 1110   PROT 7.3 03/10/2021 1039   ALBUMIN 4.6 03/10/2021 1039   AST 18 03/10/2021 1039   ALT 24 03/10/2021 1039   ALKPHOS 76 03/10/2021 1039   BILITOT 0.7 03/10/2021 1039       Component Value Date/Time   WBC 7.9 06/30/2021 1455   RBC 5.02 06/30/2021 1455   HGB 14.3 06/30/2021 1455   HCT 44.3 06/30/2021 1455   PLT 275 06/30/2021 1455   MCV 88.2 06/30/2021 1455   MCH 28.5 06/30/2021 1455   MCHC 32.3 06/30/2021 1455   RDW 13.3 06/30/2021 1455    No results found for: "POCLITH", "LITHIUM"   No results found for: "PHENYTOIN", "PHENOBARB", "VALPROATE", "CBMZ"   .res Assessment: Plan:    Plan:  1. Adderall 30mg  BID 2. Celexa 20 mg daily 3. Wellbutrin XL 300mg  4. Clonazepam 0.5mg  daily   Continue to monitor BP - WNL  RTC 3 months  Patient advised to contact office with any questions, adverse effects, or acute worsening in signs and symptoms.  Discussed potential benefits, risk, and side effects of benzodiazepines to include potential risk of tolerance and dependence, as well as possible drowsiness.  Advised patient not to drive if experiencing drowsiness and to take lowest possible effective dose to minimize risk  of dependence and tolerance.  Discussed potential benefits, risks, and side effects of stimulants with patient to include increased heart rate, palpitations, insomnia, increased anxiety, increased irritability, or decreased appetite.  Instructed patient to contact office if experiencing any significant tolerability issues. There are no diagnoses linked to this encounter.   Please see After Visit Summary for patient specific instructions.  Future Appointments  Date Time Provider [place]  03/09/2022  1:00 PM Waneta Fitting, Berdie Ogren, NP [facility] None    No orders of the defined types were placed in this encounter.     -------------------------------

## 2022-03-22 ENCOUNTER — Encounter: Payer: No Typology Code available for payment source | Admitting: Family Medicine

## 2022-03-26 ENCOUNTER — Ambulatory Visit: Payer: No Typology Code available for payment source | Admitting: Physician Assistant

## 2022-03-29 ENCOUNTER — Encounter: Payer: Self-pay | Admitting: Physician Assistant

## 2022-03-29 ENCOUNTER — Ambulatory Visit (INDEPENDENT_AMBULATORY_CARE_PROVIDER_SITE_OTHER): Payer: No Typology Code available for payment source | Admitting: Physician Assistant

## 2022-03-29 VITALS — Ht 68.0 in | Wt 235.0 lb

## 2022-03-29 DIAGNOSIS — M25562 Pain in left knee: Secondary | ICD-10-CM

## 2022-03-29 MED ORDER — METHYLPREDNISOLONE ACETATE 40 MG/ML IJ SUSP
80.0000 mg | INTRAMUSCULAR | Status: AC | PRN
  Administered 2022-03-29: 80 mg via INTRA_ARTICULAR

## 2022-03-29 MED ORDER — LIDOCAINE HCL 1 % IJ SOLN
2.0000 mL | INTRAMUSCULAR | Status: AC | PRN
  Administered 2022-03-29: 2 mL

## 2022-03-29 MED ORDER — BUPIVACAINE HCL 0.25 % IJ SOLN
2.0000 mL | INTRAMUSCULAR | Status: AC | PRN
  Administered 2022-03-29: 2 mL via INTRA_ARTICULAR

## 2022-03-29 NOTE — Progress Notes (Signed)
Office Visit Note   Patient: Abigail Wright           Date of Birth: 09/24/1965           MRN: [id number] Visit Date: 03/29/2022              Requested by: Shelda Pal, [address] [place],  [place] [address] PCP: Shelda Pal, DO  Chief Complaint  Patient presents with  . Left Knee - Pain      HPI: Patient is a pleasant 57 year old woman who have seen in the past for right knee pain.  She has had injections and done quite well and was followed by Dr. Durward Fortes.  She comes in today with some left knee pain.  She has had a history of a knee arthroscopy many years ago done at [facility].  At that time she had a meniscus tear.  She has had some problems with this left knee on and off an injection last August certainly helped her quite a bit.  Today her pain is mostly anterior but a little bit to the medial side and she has some fullness in the popliteal fossa.  Denies any calf pain.  Assessment & Plan: Visit Diagnoses: Left knee pain  Plan: Had a discussion with the patient she has had an injection in the left knee seem to help her a lot.  I think some of her symptoms are secondary to arthritic symptoms.  Will go forward with that.  Of course she could consider gel injections and he will contact me if she like to get preapproval of those.  In the meantime I have shown her some close chain quadricep strengthening exercises.  Follow-Up Instructions: Return if symptoms worsen or fail to improve.   Ortho Exam  Patient is alert, oriented, no adenopathy, well-dressed, normal affect, normal respiratory effort. Left knee no effusion no erythema.  Compartments of the left lower leg are soft and nontender she has good strength.  She does have some crepitus with the patellofemoral joint with range of motion.  Also some tenderness over the medial joint line.  Negative Homans' sign distal pulses are intact  Imaging: No results found. No images are  attached to the encounter.  Labs: No results found for: "HGBA1C", "ESRSEDRATE", "CRP", "LABURIC", "REPTSTATUS", "GRAMSTAIN", "CULT", "LABORGA"   Lab Results  Component Value Date   ALBUMIN 4.6 03/10/2021   ALBUMIN 4.3 07/26/2017    No results found for: "MG" No results found for: "VD25OH"  No results found for: "PREALBUMIN"    Latest Ref Rng & Units 06/30/2021    2:55 PM 03/10/2021   10:39 AM 07/26/2017    9:03 AM  CBC EXTENDED  WBC 4.0 - 10.5 K/uL 7.9  5.7  5.5   RBC 3.87 - 5.11 MIL/uL 5.02  5.28  4.92   Hemoglobin 12.0 - 15.0 g/dL 14.3  14.5  14.5   HCT 36.0 - 46.0 % 44.3  45.6  43.4   Platelets 150 - 400 K/uL 275  287.0  292.0      Body mass index is 35.73 kg/m.  Orders:  No orders of the defined types were placed in this encounter.  No orders of the defined types were placed in this encounter.    Procedures: Large Joint Inj on 03/29/2022 2:56 PM Indications: pain and diagnostic evaluation Details: 25 G 1.5 in needle, anteromedial approach  Arthrogram: No  Medications: 80 mg methylPREDNISolone acetate 40 MG/ML; 2 mL  lidocaine 1 %; 2 mL bupivacaine 0.25 % Outcome: tolerated well, no immediate complications Procedure, treatment alternatives, risks and benefits explained, specific risks discussed. Consent was given by the patient. Immediately prior to procedure a time out was called to verify the correct patient, procedure, equipment, support staff and site/side marked as required. Patient was prepped and draped in the usual sterile fashion.    Clinical Data: No additional findings.  ROS:  All other systems negative, except as noted in the HPI. Review of Systems  Objective: Vital Signs: Ht 5\' 8"  (1.727 m)   Wt 235 lb (106.6 kg)   BMI 35.73 kg/m   Specialty Comments:  No specialty comments available.  PMFS History: Patient Active Problem List   Diagnosis Date Noted  . Pain in right knee 08/04/2021  . Olecranon bursitis, right elbow 08/04/2021  .  Complete tear of right rotator cuff 07/14/2021  . Impingement syndrome of right shoulder 06/17/2021  . Major depressive disorder, recurrent episode, moderate ([place]) 03/06/2021  . Pain in left knee 11/04/2017  . Benign paroxysmal positional vertigo of left ear 05/15/2015  . Family history of breast cancer in mother 08/02/2011   Past Medical History:  Diagnosis Date  . ADD (attention deficit disorder)   . Anxiety   . Arthritis   . Blood transfusion without reported diagnosis    1993  . Depression   . GERD (gastroesophageal reflux disease)    in past but recently controlled with diet and exercise.  Marland Kitchen Hyperlipidemia     Family History  Problem Relation Age of Onset  . Cancer Mother   . Diabetes Father   . Heart disease Paternal Uncle   . Hypertension Paternal Uncle   . Colon cancer Neg Hx   . Esophageal cancer Neg Hx   . Rectal cancer Neg Hx   . Stomach cancer Neg Hx     Past Surgical History:  Procedure Laterality Date  . ABDOMINAL HYSTERECTOMY    . BUNIONECTOMY Left   . CARPAL TUNNEL RELEASE Bilateral   . cyst removal from left hand Left   . FOOT SURGERY    . KNEE CARTILAGE SURGERY    . planters fascitis    . SHOULDER ARTHROSCOPY WITH SUBACROMIAL DECOMPRESSION AND OPEN ROTATOR C Right 07/07/2021   Procedure: RIGHT SHOULDER ARTHROSCOPY WITH SUBACROMIAL DECOMPRESSION / ROTATOR CUFF REPAIR/ DISTAL CLAVICLE RESECTION /BICEPS TENODESIS;  Surgeon: Garald Balding, MD;  Location: [facility];  Service: Orthopedics;  Laterality: Right;  . TENDON REPAIR Left 03/21/2017   Left hand   . TONSILLECTOMY     Social History   Occupational History  . Not on file  Tobacco Use  . Smoking status: Former  . Smokeless tobacco: Never  Vaping Use  . Vaping Use: Never used  Substance and Sexual Activity  . Alcohol use: Never  . Drug use: No  . Sexual activity: Yes    Partners: Male    Birth control/protection: None, Post-menopausal

## 2022-03-31 LAB — HM PAP SMEAR: HM Pap smear: NEGATIVE

## 2022-04-12 ENCOUNTER — Ambulatory Visit (INDEPENDENT_AMBULATORY_CARE_PROVIDER_SITE_OTHER): Payer: No Typology Code available for payment source | Admitting: Family Medicine

## 2022-04-12 ENCOUNTER — Encounter: Payer: Self-pay | Admitting: Family Medicine

## 2022-04-12 VITALS — BP 124/80 | HR 90 | Temp 98.2°F | Ht 68.0 in | Wt 253.4 lb

## 2022-04-12 DIAGNOSIS — H9191 Unspecified hearing loss, right ear: Secondary | ICD-10-CM

## 2022-04-12 DIAGNOSIS — Z0000 Encounter for general adult medical examination without abnormal findings: Secondary | ICD-10-CM | POA: Diagnosis not present

## 2022-04-12 DIAGNOSIS — J0140 Acute pansinusitis, unspecified: Secondary | ICD-10-CM

## 2022-04-12 LAB — CBC
HCT: 44.1 % (ref 36.0–46.0)
Hemoglobin: 14.5 g/dL (ref 12.0–15.0)
MCHC: 32.8 g/dL (ref 30.0–36.0)
MCV: 84.3 fl (ref 78.0–100.0)
Platelets: 298 10*3/uL (ref 150.0–400.0)
RBC: 5.23 Mil/uL — ABNORMAL HIGH (ref 3.87–5.11)
RDW: 14.8 % (ref 11.5–15.5)
WBC: 7.7 10*3/uL (ref 4.0–10.5)

## 2022-04-12 LAB — COMPREHENSIVE METABOLIC PANEL
ALT: 24 U/L (ref 0–35)
AST: 20 U/L (ref 0–37)
Albumin: 4.4 g/dL (ref 3.5–5.2)
Alkaline Phosphatase: 91 U/L (ref 39–117)
BUN: 17 mg/dL (ref 6–23)
CO2: 33 mEq/L — ABNORMAL HIGH (ref 19–32)
Calcium: 10.1 mg/dL (ref 8.4–10.5)
Chloride: 99 mEq/L (ref 96–112)
Creatinine, Ser: 0.91 mg/dL (ref 0.40–1.20)
GFR: 71.32 mL/min (ref >60.00)
Glucose, Bld: 103 mg/dL — ABNORMAL HIGH (ref 70–99)
Potassium: 5 mEq/L (ref 3.5–5.1)
Sodium: 140 mEq/L (ref 135–145)
Total Bilirubin: 0.5 mg/dL (ref 0.2–1.2)
Total Protein: 7.2 g/dL (ref 6.0–8.3)

## 2022-04-12 LAB — LIPID PANEL
Cholesterol: 228 mg/dL — ABNORMAL HIGH (ref 0–200)
HDL: 72.6 mg/dL (ref >39.00)
LDL Cholesterol: 134 mg/dL — ABNORMAL HIGH (ref 0–99)
NonHDL: 155.67
Total CHOL/HDL Ratio: 3
Triglycerides: 106 mg/dL (ref 0.0–149.0)
VLDL: 21.2 mg/dL (ref 0.0–40.0)

## 2022-04-12 MED ORDER — AMOXICILLIN-POT CLAVULANATE 875-125 MG PO TABS: 1.0000 | ORAL_TABLET | Freq: Two times a day (BID) | ORAL | 0 refills | Status: AC

## 2022-04-12 MED ORDER — PREDNISONE 20 MG PO TABS: 40.0000 mg | ORAL_TABLET | Freq: Every day | ORAL | 0 refills | Status: AC

## 2022-04-12 NOTE — Patient Instructions (Addendum)
Give [place] 2-3 business days to get the results of your labs back.   Keep the diet clean and stay active.  Please get me a copy of your advanced directive form at your convenience.   If you do not hear anything about your referral in the next 1-2 weeks, call our office and ask for an update.  Let us know if you need anything.

## 2022-04-12 NOTE — Progress Notes (Signed)
Chief Complaint  Patient presents with   Annual Exam     Well Woman Abigail Wright is here for a complete physical.   Her last physical was >1 year ago.  Current diet: in general, diet is OK. Current exercise: limited. Weight is stable and she denies fatigue out of ordinary. Patient has had a hysterectomy. Seatbelt? Yes Advanced directive? Yes  Health Maintenance Mammogram- Yes Colon cancer screening-Yes Shingrix- Yes Tetanus- Yes Hep C screening- Yes HIV screening- Yes  URI Duration: 3 weeks  Associated symptoms: sinus congestion, sinus pain, rhinorrhea, sore throat, and coughing Denies: itchy watery eyes, ear pain, ear drainage, wheezing, shortness of breath, myalgia, and fevers Treatment to date: OTC meds Sick contacts: Yes; family members (2 young grandchildren)  Past Medical History:  Diagnosis Date   ADD (attention deficit disorder)    Anxiety    Arthritis    Blood transfusion without reported diagnosis    1993   Depression    GERD (gastroesophageal reflux disease)    in past but recently controlled with diet and exercise.   Hyperlipidemia      Past Surgical History:  Procedure Laterality Date   ABDOMINAL HYSTERECTOMY     BUNIONECTOMY Left    CARPAL TUNNEL RELEASE Bilateral    cyst removal from left hand Left    FOOT SURGERY     KNEE CARTILAGE SURGERY     planters fascitis     SHOULDER ARTHROSCOPY WITH SUBACROMIAL DECOMPRESSION AND OPEN ROTATOR C Right 07/07/2021   Procedure: RIGHT SHOULDER ARTHROSCOPY WITH SUBACROMIAL DECOMPRESSION / ROTATOR CUFF REPAIR/ DISTAL CLAVICLE RESECTION /BICEPS TENODESIS;  Surgeon: Garald Balding, MD;  Location: [facility];  Service: Orthopedics;  Laterality: Right;   TENDON REPAIR Left 03/21/2017   Left hand    TONSILLECTOMY      Medications  Current Outpatient Medications on File Prior to Visit  Medication Sig Dispense Refill   acetaminophen (TYLENOL) 325 MG tablet Take 325 mg by mouth every 6 (six) hours as needed for  moderate pain.     amphetamine-dextroamphetamine (ADDERALL) 30 MG tablet Take 1 tablet by mouth 2 (two) times daily. 60 tablet 0   amphetamine-dextroamphetamine (ADDERALL) 30 MG tablet Take 1 tablet by mouth 2 (two) times daily. 60 tablet 0   [START ON 05/04/2022] amphetamine-dextroamphetamine (ADDERALL) 30 MG tablet Take 1 tablet by mouth 2 (two) times daily. 60 tablet 0   buPROPion (WELLBUTRIN XL) 300 MG 24 hr tablet Take 1 tablet (300 mg total) by mouth daily. 90 tablet 3   citalopram (CELEXA) 20 MG tablet TAKE 1 TABLET BY MOUTH EACH DAY 90 tablet 3   clonazePAM (KLONOPIN) 0.5 MG tablet Take 1 tablet (0.5 mg total) by mouth at bedtime. 90 tablet 2   fluticasone (FLONASE) 50 MCG/ACT nasal spray Place 1 spray into both nostrils daily.     Multiple Vitamins-Minerals (MULTIVITAMIN ADULT EXTRA C PO) Take 1 tablet by mouth daily.     Allergies No Known Allergies  Review of Systems: Constitutional:  no unexpected weight changes Eye:  no recent significant change in vision Ear/Nose/Mouth/Throat:  Ears:  no recent change in hearing on L, +hearing loss on R Nose/Mouth/Throat:  no complaints of nasal congestion, no sore throat Cardiovascular: no chest pain Respiratory:  no shortness of breath Gastrointestinal:  no abdominal pain, no change in bowel habits GU:  Female: negative for dysuria or pelvic pain Musculoskeletal/Extremities:  no pain of the joints Integumentary (Skin/Breast):  no abnormal skin lesions reported Neurologic:  no headaches Endocrine:  denies fatigue  Exam BP 124/80 (BP Location: Left Arm, Patient Position: Sitting, Cuff Size: Large)   Pulse 90   Temp 98.2 F (36.8 C) (Oral)   Ht 5' 8"$  (1.727 m)   Wt 253 lb 6 oz (114.9 kg)   SpO2 98%   BMI 38.53 kg/m  General:  well developed, well nourished, in no apparent distress Skin:  no significant moles, warts, or growths Head:  no masses, lesions, or tenderness Eyes:  pupils equal and round, sclera anicteric without  injection Ears:  canals without lesions, TMs shiny without retraction, no obvious effusion, no erythema Nose:  nares patent, mucosa normal, and no drainage; +ttp over max sinuses b/l Throat/Pharynx:  lips and gingiva without lesion; tongue and uvula midline; non-inflamed pharynx; no exudates or postnasal drainage Neck: neck supple without adenopathy, thyromegaly, or masses Lungs:  clear to auscultation, breath sounds equal bilaterally, no respiratory distress Cardio:  regular rate and rhythm, no LE edema Abdomen:  abdomen soft, nontender; bowel sounds normal; no masses or organomegaly Genital: Defer to GYN Musculoskeletal:  symmetrical muscle groups noted without atrophy or deformity Extremities:  no clubbing, cyanosis, or edema, no deformities, no skin discoloration Neuro:  gait normal; deep tendon reflexes normal and symmetric Psych: well oriented with normal range of affect and appropriate judgment/insight  Assessment and Plan  Well adult exam - Plan: CBC, Comprehensive metabolic panel, Lipid panel  Acute pansinusitis, recurrence not specified - Plan: predniSONE (DELTASONE) 20 MG tablet, amoxicillin-clavulanate (AUGMENTIN) 875-125 MG tablet  Hearing loss of right ear, unspecified hearing loss type - Plan: Ambulatory referral to ENT   Well 57 y.o. female. Counseled on diet and exercise. Other orders as above. Sinusitis: 5 d pred burst 40 mg/d, 7 d of Augmentin given duration.  Hearing loss: Exam neg. Requesting referral to ENT. Referral placed today.  Follow up in 12 mo. The patient voiced understanding and agreement to the plan.  [place], DO 04/12/22 9:31 AM

## 2022-05-06 ENCOUNTER — Telehealth: Payer: Self-pay | Admitting: Family Medicine

## 2022-05-06 NOTE — Telephone Encounter (Signed)
Pt states her ins will only pay for [facility], so she needs the ENT [facility].

## 2022-05-06 NOTE — Telephone Encounter (Signed)
Are you able to notify the pt of this information so she can decide what she should do?

## 2022-05-23 ENCOUNTER — Encounter: Payer: Self-pay | Admitting: Family Medicine

## 2022-05-24 ENCOUNTER — Other Ambulatory Visit: Payer: Self-pay | Admitting: Family Medicine

## 2022-05-24 DIAGNOSIS — H9191 Unspecified hearing loss, right ear: Secondary | ICD-10-CM

## 2022-06-08 ENCOUNTER — Encounter: Payer: Self-pay | Admitting: Adult Health

## 2022-06-08 ENCOUNTER — Telehealth (INDEPENDENT_AMBULATORY_CARE_PROVIDER_SITE_OTHER): Payer: Self-pay | Admitting: Adult Health

## 2022-06-08 DIAGNOSIS — F411 Generalized anxiety disorder: Secondary | ICD-10-CM

## 2022-06-08 DIAGNOSIS — F331 Major depressive disorder, recurrent, moderate: Secondary | ICD-10-CM

## 2022-06-08 DIAGNOSIS — F909 Attention-deficit hyperactivity disorder, unspecified type: Secondary | ICD-10-CM

## 2022-06-08 MED ORDER — AMPHETAMINE-DEXTROAMPHETAMINE 30 MG PO TABS
30.0000 mg | ORAL_TABLET | Freq: Two times a day (BID) | ORAL | 0 refills | Status: DC
Start: 2022-07-06 — End: 2022-09-14

## 2022-06-08 MED ORDER — AMPHETAMINE-DEXTROAMPHETAMINE 30 MG PO TABS: 1.0000 | ORAL_TABLET | Freq: Two times a day (BID) | ORAL | 0 refills | Status: DC

## 2022-06-08 MED ORDER — AMPHETAMINE-DEXTROAMPHETAMINE 30 MG PO TABS
30.0000 mg | ORAL_TABLET | Freq: Two times a day (BID) | ORAL | 0 refills | Status: DC
Start: 2022-06-08 — End: 2022-09-14

## 2022-06-08 MED ORDER — CLONAZEPAM 0.5 MG PO TABS: 0.5000 mg | ORAL_TABLET | Freq: Every day | ORAL | 2 refills | Status: DC

## 2022-06-08 NOTE — Progress Notes (Signed)
Abigail Wright [id number] 1965-06-07 57 y.o.  Virtual Visit via Video Note  I connected with pt @ on 06/08/22 at 12:00 PM EDT by a video enabled telemedicine application and verified that I am speaking with the correct person using two identifiers.   I discussed the limitations of evaluation and management by telemedicine and the availability of in person appointments. The patient expressed understanding and agreed to proceed.  I discussed the assessment and treatment plan with the patient. The patient was provided an opportunity to ask questions and all were answered. The patient agreed with the plan and demonstrated an understanding of the instructions.   The patient was advised to call back or seek an in-person evaluation if the symptoms worsen or if the condition fails to improve as anticipated.  I provided 15 minutes of non-face-to-face time during this encounter.  The patient was located at home.  The provider was located at [facility].   Dorothyann Gibbs, NP   Subjective:   Patient ID:  Abigail Wright is a 57 y.o. (DOB Dec 26, 1965) female.  Chief Complaint: No chief complaint on file.   HPI Abigail Wright presents for follow-up of anxiety, ADHD and depression.  Describes mood today as "ok". Pleasant. Denies tearfulness. Mood symptoms - denies depression, anxiety and irritability. Mood is consistent. Stating "I'm doing good"". Feels like medications are working well. Family doing well. Stable interest and motivation. Taking medications as prescribed.  Energy levels stable. Active, does not have a regular exercise routine.  Enjoys some usual interests and activities. Married. Lives with husband. Spending time with family - 2 daughters - 2 granddaughters. Appetite adequate. Weight stable.  Sleeps well most nights. Averages 7 to 8  hours.  Focus and concentration stable. Completing tasks. 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   Denies SI or HI.   Denies AH or VH. Denies self harm.     Review of Systems:  Review of Systems  Musculoskeletal:  Negative for gait problem.  Neurological:  Negative for tremors.  Psychiatric/Behavioral:         Please refer to HPI    Medications: I have reviewed the patient's current medications.  Current Outpatient Medications  Medication Sig Dispense Refill   acetaminophen (TYLENOL) 325 MG tablet Take 325 mg by mouth every 6 (six) hours as needed for moderate pain.     amphetamine-dextroamphetamine (ADDERALL) 30 MG tablet Take 1 tablet by mouth 2 (two) times daily. 60 tablet 0   amphetamine-dextroamphetamine (ADDERALL) 30 MG tablet Take 1 tablet by mouth 2 (two) times daily. 60 tablet 0   amphetamine-dextroamphetamine (ADDERALL) 30 MG tablet Take 1 tablet by mouth 2 (two) times daily. 60 tablet 0   buPROPion (WELLBUTRIN XL) 300 MG 24 hr tablet Take 1 tablet (300 mg total) by mouth daily. 90 tablet 3   citalopram (CELEXA) 20 MG tablet TAKE 1 TABLET BY MOUTH EACH DAY 90 tablet 3   clonazePAM (KLONOPIN) 0.5 MG tablet Take 1 tablet (0.5 mg total) by mouth at bedtime. 90 tablet 2   fluticasone (FLONASE) 50 MCG/ACT nasal spray Place 1 spray into both nostrils daily.     Multiple Vitamins-Minerals (MULTIVITAMIN ADULT EXTRA C PO) Take 1 tablet by mouth daily.     No current facility-administered medications for this visit.    Medication Side Effects: None  Allergies: No Known Allergies  Past Medical History:  Diagnosis Date   ADD (attention deficit disorder)    Anxiety    Arthritis  Blood transfusion without reported diagnosis    1993   Depression    GERD (gastroesophageal reflux disease)    in past but recently controlled with diet and exercise.   Hyperlipidemia     Family History  Problem Relation Age of Onset   Cancer Mother    Diabetes Father    Heart disease Paternal Uncle    Hypertension Paternal Uncle    Colon cancer Neg Hx    Esophageal cancer Neg Hx    Rectal cancer Neg  Hx    Stomach cancer Neg Hx     Social History   Socioeconomic History   Marital status: Married    Spouse name: Not on file   Number of children: Not on file   Years of education: Not on file   Highest education level: Not on file  Occupational History   Not on file  Tobacco Use   Smoking status: Former   Smokeless tobacco: Never  Vaping Use   Vaping Use: Never used  Substance and Sexual Activity   Alcohol use: Never   Drug use: No   Sexual activity: Yes    Partners: Male    Birth control/protection: None, Post-menopausal  Other Topics Concern   Not on file  Social History Narrative   Not on file   Social Determinants of Health   Financial Resource Strain: Not on file  Food Insecurity: Not on file  Transportation Needs: Not on file  Physical Activity: Not on file  Stress: Not on file  Social Connections: Not on file  Intimate Partner Violence: Not on file    Past Medical History, Surgical history, Social history, and Family history were reviewed and updated as appropriate.   Please see review of systems for further details on the patient's review from today.   Objective:   Physical Exam:  There were no vitals taken for this visit.  Physical Exam Constitutional:      General: She is not in acute distress. Musculoskeletal:        General: No deformity.  Neurological:     Mental Status: She is alert and oriented to person, place, and time.     Coordination: Coordination normal.  Psychiatric:        Attention and Perception: Attention and perception normal. She does not perceive auditory or visual hallucinations.        Mood and Affect: Mood normal. Mood is not anxious or depressed. Affect is not labile, blunt, angry or inappropriate.        Speech: Speech normal.        Behavior: Behavior normal.        Thought Content: Thought content normal. Thought content is not paranoid or delusional. Thought content does not include homicidal or suicidal ideation.  Thought content does not include homicidal or suicidal plan.        Cognition and Memory: Cognition and memory normal.        Judgment: Judgment normal.     Comments: Insight intact     Lab Review:     Component Value Date/Time   NA 140 04/12/2022 0938   K 5.0 04/12/2022 0938   CL 99 04/12/2022 0938   CO2 33 (H) 04/12/2022 0938   GLUCOSE 103 (H) 04/12/2022 0938   BUN 17 04/12/2022 0938   CREATININE 0.91 04/12/2022 0938   CALCIUM 10.1 04/12/2022 0938   PROT 7.2 04/12/2022 0938   ALBUMIN 4.4 04/12/2022 0938   AST 20 04/12/2022 0938   ALT  24 04/12/2022 0938   ALKPHOS 91 04/12/2022 0938   BILITOT 0.5 04/12/2022 0938       Component Value Date/Time   WBC 7.7 04/12/2022 0938   RBC 5.23 (H) 04/12/2022 0938   HGB 14.5 04/12/2022 0938   HCT 44.1 04/12/2022 0938   PLT 298.0 04/12/2022 0938   MCV 84.3 04/12/2022 0938   MCH 28.5 06/30/2021 1455   MCHC 32.8 04/12/2022 0938   RDW 14.8 04/12/2022 0938    No results found for: "POCLITH", "LITHIUM"   No results found for: "PHENYTOIN", "PHENOBARB", "VALPROATE", "CBMZ"   .res Assessment: Plan:    Plan:  1. Adderall 30mg  BID 2. Celexa 20 mg daily 3. Wellbutrin XL 300mg  4. Clonazepam 0.5mg  daily   Continue to monitor BP - WNL  RTC 3 months  Patient advised to contact office with any questions, adverse effects, or acute worsening in signs and symptoms.  Discussed potential benefits, risk, and side effects of benzodiazepines to include potential risk of tolerance and dependence, as well as possible drowsiness.  Advised patient not to drive if experiencing drowsiness and to take lowest possible effective dose to minimize risk of dependence and tolerance.  Discussed potential benefits, risks, and side effects of stimulants with patient to include increased heart rate, palpitations, insomnia, increased anxiety, increased irritability, or decreased appetite.  Instructed patient to contact office if experiencing any significant  tolerability issues.  There are no diagnoses linked to this encounter.   Please see After Visit Summary for patient specific instructions.  Future Appointments  Date Time Provider Department Center  06/08/2022 12:00 PM Tamula Morrical, Thereasa Solo, NP [facility] None  04/15/2023  9:00 AM Carmelia Roller, Jilda Roche, DO [id number]    No orders of the defined types were placed in this encounter.     -------------------------------

## 2022-08-19 ENCOUNTER — Encounter: Payer: Self-pay | Admitting: Physician Assistant

## 2022-08-19 ENCOUNTER — Other Ambulatory Visit (INDEPENDENT_AMBULATORY_CARE_PROVIDER_SITE_OTHER): Payer: No Typology Code available for payment source

## 2022-08-19 ENCOUNTER — Telehealth: Payer: No Typology Code available for payment source | Admitting: Radiology

## 2022-08-19 ENCOUNTER — Ambulatory Visit (INDEPENDENT_AMBULATORY_CARE_PROVIDER_SITE_OTHER): Payer: No Typology Code available for payment source | Admitting: Physician Assistant

## 2022-08-19 DIAGNOSIS — M25562 Pain in left knee: Secondary | ICD-10-CM | POA: Diagnosis not present

## 2022-08-19 DIAGNOSIS — M1712 Unilateral primary osteoarthritis, left knee: Secondary | ICD-10-CM | POA: Diagnosis not present

## 2022-08-19 MED ORDER — MELOXICAM 15 MG PO TABS
15.0000 mg | ORAL_TABLET | Freq: Every day | ORAL | 0 refills | Status: DC
Start: 2022-08-19 — End: 2022-09-13

## 2022-08-19 NOTE — Progress Notes (Signed)
Office Visit Note   Patient: Abigail Wright           Date of Birth: [birth year]           MRN: [id number] Visit Date: 08/19/2022              Requested by: Sharlene Dory, DO [address] [place],  [place] [address] PCP: Sharlene Dory, DO  Chief Complaint  Patient presents with   Left Knee - Pain      HPI: Patient is a pleasant 57 year old woman who have seen in the past for left knee pain.  She has a history of arthritis in her left knee.  She did get a steroid injection but did not get significant long-lasting relief.  Points to most of the pain over the medial side of her knee which is not had any new injury.  She does take care of her grandchild so she is on her feet quite a bit.  She is currently taking 12 ibuprofen a day to help with the pain 6  Assessment & Plan: Visit Diagnoses:  1. Left knee pain, unspecified chronicity   2. Unilateral primary osteoarthritis, left knee     Plan: I had a long discussion with the patient.  We discussed trying viscosupplementation in the past and she would like to go forward with this.  I am also concerned about her ibuprofen usage.  I would like her to stop the ibuprofen and see if she can just take 1 meloxicam a day which I will call in for her.  She understands if she has any GI upset she is to stop it and should take this with food will follow-up once viscosupplementation is approved This patient is diagnosed with osteoarthritis of the knee(s).    Radiographs show evidence of joint space narrowing, osteophytes, subchondral sclerosis and/or subchondral cysts.  This patient has knee pain which interferes with functional and activities of daily living.    This patient has experienced inadequate response, adverse effects and/or intolerance with conservative treatments such as acetaminophen, NSAIDS, topical creams, physical therapy or regular exercise, knee bracing and/or weight loss.   This patient has  experienced inadequate response or has a contraindication to intra articular steroid injections for at least 3 months.   This patient is not scheduled to have a total knee replacement within 6 months of starting treatment with viscosupplementation.  Follow-Up Instructions: No follow-ups on file.   Ortho Exam  Patient is alert, oriented, no adenopathy, well-dressed, normal affect, normal respiratory effort. Examination of her left knee she has no effusion no redness no erythema she has some soft tissue swelling a little bit more medially than laterally.  She has grinding with range of motion and tenderness over the medial joint line.  She has good varus valgus stability  Imaging: XR Knee 1-2 Views Left  Result Date: 08/19/2022 2 views of her left knee were taken today.  She does have osteoarthritis of all 3 compartments.  She has almost complete loss of joint space medially with sclerotic changes.  Also loss of joint space patellofemoral joint she has periarticular osteophytes.  No images are attached to the encounter.  Labs: No results found for: "HGBA1C", "ESRSEDRATE", "CRP", "LABURIC", "REPTSTATUS", "GRAMSTAIN", "CULT", "LABORGA"   Lab Results  Component Value Date   ALBUMIN 4.4 04/12/2022   ALBUMIN 4.6 03/10/2021   ALBUMIN 4.3 07/26/2017    No results found for: "MG" No results found for: "VD25OH"  No results found for: "PREALBUMIN"    Latest Ref Rng & Units 04/12/2022    9:38 AM 06/30/2021    2:55 PM 03/10/2021   10:39 AM  CBC EXTENDED  WBC 4.0 - 10.5 K/uL 7.7  7.9  5.7   RBC 3.87 - 5.11 Mil/uL 5.23  5.02  5.28   Hemoglobin 12.0 - 15.0 g/dL 40.9  81.1  91.4   HCT 36.0 - 46.0 % 44.1  44.3  45.6   Platelets 150.0 - 400.0 K/uL 298.0  275  287.0      There is no height or weight on file to calculate BMI.  Orders:  Orders Placed This Encounter  Procedures   XR Knee 1-2 Views Left   Meds ordered this encounter  Medications   meloxicam (MOBIC) 15 MG tablet    Sig: Take  1 tablet (15 mg total) by mouth daily.    Dispense:  30 tablet    Refill:  0     Procedures: No procedures performed  Clinical Data: No additional findings.  ROS:  All other systems negative, except as noted in the HPI. Review of Systems  Objective: Vital Signs: There were no vitals taken for this visit.  Specialty Comments:  No specialty comments available.  PMFS History: Patient Active Problem List   Diagnosis Date Noted   Unilateral primary osteoarthritis, left knee 08/19/2022   Pain in right knee 08/04/2021   Olecranon bursitis, right elbow 08/04/2021   Complete tear of right rotator cuff 07/14/2021   Impingement syndrome of right shoulder 06/17/2021   Major depressive disorder, recurrent episode, moderate (HCC) 03/06/2021   Pain in left knee 11/04/2017   Benign paroxysmal positional vertigo of left ear 05/15/2015   Family history of breast cancer in mother 08/02/2011   Past Medical History:  Diagnosis Date   ADD (attention deficit disorder)    Anxiety    Arthritis    Blood transfusion without reported diagnosis    1993   Depression    GERD (gastroesophageal reflux disease)    in past but recently controlled with diet and exercise.   Hyperlipidemia     Family History  Problem Relation Age of Onset   Cancer Mother    Diabetes Father    Heart disease Paternal Uncle    Hypertension Paternal Uncle    Colon cancer Neg Hx    Esophageal cancer Neg Hx    Rectal cancer Neg Hx    Stomach cancer Neg Hx     Past Surgical History:  Procedure Laterality Date   ABDOMINAL HYSTERECTOMY     BUNIONECTOMY Left    CARPAL TUNNEL RELEASE Bilateral    cyst removal from left hand Left    FOOT SURGERY     KNEE CARTILAGE SURGERY     planters fascitis     SHOULDER ARTHROSCOPY WITH SUBACROMIAL DECOMPRESSION AND OPEN ROTATOR C Right 07/07/2021   Procedure: RIGHT SHOULDER ARTHROSCOPY WITH SUBACROMIAL DECOMPRESSION / ROTATOR CUFF REPAIR/ DISTAL CLAVICLE RESECTION /BICEPS  TENODESIS;  Surgeon: Valeria Batman, MD;  Location: [facility];  Service: Orthopedics;  Laterality: Right;   TENDON REPAIR Left 03/21/2017   Left hand    TONSILLECTOMY     Social History   Occupational History   Not on file  Tobacco Use   Smoking status: Former   Smokeless tobacco: Never  Vaping Use   Vaping Use: Never used  Substance and Sexual Activity   Alcohol use: Never   Drug use: No   Sexual activity:  Yes    Partners: Male    Birth control/protection: None, Post-menopausal

## 2022-08-19 NOTE — Telephone Encounter (Signed)
Can you please get auth for Visco for her left knee per [facility]. Thank you

## 2022-08-24 NOTE — Telephone Encounter (Signed)
VOB submitted for Monovisc, left knee 

## 2022-09-07 ENCOUNTER — Telehealth (INDEPENDENT_AMBULATORY_CARE_PROVIDER_SITE_OTHER): Payer: Self-pay | Admitting: Adult Health

## 2022-09-07 ENCOUNTER — Encounter: Payer: Self-pay | Admitting: Adult Health

## 2022-09-07 DIAGNOSIS — Z0389 Encounter for observation for other suspected diseases and conditions ruled out: Secondary | ICD-10-CM

## 2022-09-07 NOTE — Progress Notes (Deleted)
Abigail Wright [id number] 18-Feb-1966 57 y.o.  Virtual Visit via Video Note  I connected with pt @ on 09/07/22 at 12:00 PM EDT by a video enabled telemedicine application and verified that I am speaking with the correct person using two identifiers.   I discussed the limitations of evaluation and management by telemedicine and the availability of in person appointments. The patient expressed understanding and agreed to proceed.  I discussed the assessment and treatment plan with the patient. The patient was provided an opportunity to ask questions and all were answered. The patient agreed with the plan and demonstrated an understanding of the instructions.   The patient was advised to call back or seek an in-person evaluation if the symptoms worsen or if the condition fails to improve as anticipated.  I provided 15 minutes of non-face-to-face time during this encounter.  The patient was located at home.  The provider was located at [facility].   Dorothyann Gibbs, NP   Subjective:   Patient ID:  Abigail Wright is a 57 y.o. (DOB 1966-01-02) female.  Chief Complaint: No chief complaint on file.   HPI Abigail Wright presents for follow-up of anxiety, ADHD and depression.  Describes mood today as "ok". Pleasant. Denies tearfulness. Mood symptoms - denies depression, anxiety and irritability. Mood is consistent. Stating "I'm doing good"". Feels like medications are working well. Family doing well. Stable interest and motivation. Taking medications as prescribed.  Energy levels stable. Active, does not have a regular exercise routine.  Enjoys some usual interests and activities. Married. Lives with husband. Spending time with family - 2 daughters - 2 granddaughters. Appetite adequate. Weight stable.  Sleeps well most nights. Averages 7 to 8  hours.  Focus and concentration stable. Completing tasks. 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   Denies SI or HI.   Denies AH or VH. Denies self harm.    Review of Systems:  Review of Systems  Musculoskeletal:  Negative for gait problem.  Neurological:  Negative for tremors.  Psychiatric/Behavioral:         Please refer to HPI    Medications: {medication reviewed/display:3041432}  Current Outpatient Medications  Medication Sig Dispense Refill   acetaminophen (TYLENOL) 325 MG tablet Take 325 mg by mouth every 6 (six) hours as needed for moderate pain.     amphetamine-dextroamphetamine (ADDERALL) 30 MG tablet Take 1 tablet by mouth 2 (two) times daily. 60 tablet 0   amphetamine-dextroamphetamine (ADDERALL) 30 MG tablet Take 1 tablet by mouth 2 (two) times daily. 60 tablet 0   amphetamine-dextroamphetamine (ADDERALL) 30 MG tablet Take 1 tablet by mouth 2 (two) times daily. 60 tablet 0   buPROPion (WELLBUTRIN XL) 300 MG 24 hr tablet Take 1 tablet (300 mg total) by mouth daily. 90 tablet 3   citalopram (CELEXA) 20 MG tablet TAKE 1 TABLET BY MOUTH EACH DAY 90 tablet 3   clonazePAM (KLONOPIN) 0.5 MG tablet Take 1 tablet (0.5 mg total) by mouth at bedtime. 90 tablet 2   fluticasone (FLONASE) 50 MCG/ACT nasal spray Place 1 spray into both nostrils daily.     meloxicam (MOBIC) 15 MG tablet Take 1 tablet (15 mg total) by mouth daily. 30 tablet 0   Multiple Vitamins-Minerals (MULTIVITAMIN ADULT EXTRA C PO) Take 1 tablet by mouth daily.     No current facility-administered medications for this visit.    Medication Side Effects: None  Allergies: No Known Allergies  Past Medical History:  Diagnosis Date   ADD (  attention deficit disorder)    Anxiety    Arthritis    Blood transfusion without reported diagnosis    1993   Depression    GERD (gastroesophageal reflux disease)    in past but recently controlled with diet and exercise.   Hyperlipidemia     Family History  Problem Relation Age of Onset   Cancer Mother    Diabetes Father    Heart disease Paternal Uncle    Hypertension Paternal Uncle     Colon cancer Neg Hx    Esophageal cancer Neg Hx    Rectal cancer Neg Hx    Stomach cancer Neg Hx     Social History   Socioeconomic History   Marital status: Married    Spouse name: Not on file   Number of children: Not on file   Years of education: Not on file   Highest education level: Not on file  Occupational History   Not on file  Tobacco Use   Smoking status: Former   Smokeless tobacco: Never  Vaping Use   Vaping status: Never Used  Substance and Sexual Activity   Alcohol use: Never   Drug use: No   Sexual activity: Yes    Partners: Male    Birth control/protection: None, Post-menopausal  Other Topics Concern   Not on file  Social History Narrative   Not on file   Social Determinants of Health   Financial Resource Strain: Low Risk  (03/28/2022)   Received from [facility]   Overall Financial Resource Strain (CARDIA)    Difficulty of Paying Living Expenses: Not very hard  Food Insecurity: No Food Insecurity (03/28/2022)   [facility] from [facility]   Hunger Vital Sign    Worried About Running Out of Food in the Last Year: Never true    Ran Out of Food in the Last Year: Never true  Transportation Needs: No Transportation Needs (03/28/2022)   Received from [facility] - Transportation    Lack of Transportation (Medical): No    Lack of Transportation (Non-Medical): No  Physical Activity: Insufficiently Active (03/28/2022)   Received from [facility]   Exercise Vital Sign    Days of Exercise per Week: 2 days    Minutes of Exercise per Session: 20 min  Stress: No Stress Concern Present (03/28/2022)   Received from [facility] - Occupational Stress Questionnaire    Feeling of Stress : Not at all  Social Connections: Moderately Integrated (03/28/2022)   Received from [facility]   Social Network    How would you rate your social network (family, work, friends)?: Adequate participation with social networks   Intimate Partner Violence: Not At Risk (03/28/2022)   Received from [facility]   HITS    Over the last 12 months how often did your partner physically hurt you?: 1    Over the last 12 months how often did your partner insult you or talk down to you?: 1    Over the last 12 months how often did your partner threaten you with physical harm?: 1    Over the last 12 months how often did your partner scream or curse at you?: 1    Past Medical History, Surgical history, Social history, and Family history were reviewed and updated as appropriate.   Please see review of systems for further details on the patient's review from today.   Objective:   Physical Exam:  There  were no vitals taken for this visit.  Physical Exam Constitutional:      General: She is not in acute distress. Musculoskeletal:        General: No deformity.  Neurological:     Mental Status: She is alert and oriented to person, place, and time.     Coordination: Coordination normal.  Psychiatric:        Attention and Perception: Attention and perception normal. She does not perceive auditory or visual hallucinations.        Mood and Affect: Affect is not labile, blunt, angry or inappropriate.        Speech: Speech normal.        Behavior: Behavior normal.        Thought Content: Thought content normal. Thought content is not paranoid or delusional. Thought content does not include homicidal or suicidal ideation. Thought content does not include homicidal or suicidal plan.        Cognition and Memory: Cognition and memory normal.        Judgment: Judgment normal.     Comments: Insight intact     Lab Review:     Component Value Date/Time   NA 140 04/12/2022 0938   K 5.0 04/12/2022 0938   CL 99 04/12/2022 0938   CO2 33 (H) 04/12/2022 0938   GLUCOSE 103 (H) 04/12/2022 0938   BUN 17 04/12/2022 0938   CREATININE 0.91 04/12/2022 0938   CALCIUM 10.1 04/12/2022 0938   PROT 7.2 04/12/2022 0938   ALBUMIN 4.4  04/12/2022 0938   AST 20 04/12/2022 0938   ALT 24 04/12/2022 0938   ALKPHOS 91 04/12/2022 0938   BILITOT 0.5 04/12/2022 0938       Component Value Date/Time   WBC 7.7 04/12/2022 0938   RBC 5.23 (H) 04/12/2022 0938   HGB 14.5 04/12/2022 0938   HCT 44.1 04/12/2022 0938   PLT 298.0 04/12/2022 0938   MCV 84.3 04/12/2022 0938   MCH 28.5 06/30/2021 1455   MCHC 32.8 04/12/2022 0938   RDW 14.8 04/12/2022 0938    No results found for: "POCLITH", "LITHIUM"   No results found for: "PHENYTOIN", "PHENOBARB", "VALPROATE", "CBMZ"   .res Assessment: Plan:    Plan:  1. Adderall 30mg  BID 2. Celexa 20 mg daily 3. Wellbutrin XL 300mg  4. Clonazepam 0.5mg  daily   Continue to monitor BP - WNL  RTC 3 months  Patient advised to contact office with any questions, adverse effects, or acute worsening in signs and symptoms.  Discussed potential benefits, risk, and side effects of benzodiazepines to include potential risk of tolerance and dependence, as well as possible drowsiness.  Advised patient not to drive if experiencing drowsiness and to take lowest possible effective dose to minimize risk of dependence and tolerance.  Discussed potential benefits, risks, and side effects of stimulants with patient to include increased heart rate, palpitations, insomnia, increased anxiety, increased irritability, or decreased appetite.  Instructed patient to contact office if experiencing any significant tolerability issues.  There are no diagnoses linked to this encounter.   Please see After Visit Summary for patient specific instructions.  Future Appointments  Date Time Provider Department Center  09/07/2022 12:00 PM Kaedon Fanelli, Thereasa Solo, NP [facility] None  04/15/2023  9:00 AM Carmelia Roller, Jilda Roche, DO [facility]    No orders of the defined types were placed in this encounter.     -------------------------------

## 2022-09-07 NOTE — Progress Notes (Signed)
Patient no show appointment. ? ?

## 2022-09-13 ENCOUNTER — Other Ambulatory Visit: Payer: Self-pay | Admitting: Physician Assistant

## 2022-09-13 ENCOUNTER — Telehealth: Payer: Self-pay | Admitting: Physician Assistant

## 2022-09-13 MED ORDER — MELOXICAM 15 MG PO TABS: 15.0000 mg | ORAL_TABLET | Freq: Every day | ORAL | 0 refills | Status: DC

## 2022-09-13 NOTE — Telephone Encounter (Signed)
I called and advised. 

## 2022-09-13 NOTE — Telephone Encounter (Signed)
Patient called. Would like a refill on meloxicam. Her cb# [phone]. Also she would like a call if she needs an appointment.

## 2022-09-14 ENCOUNTER — Telehealth (INDEPENDENT_AMBULATORY_CARE_PROVIDER_SITE_OTHER): Payer: No Typology Code available for payment source | Admitting: Adult Health

## 2022-09-14 ENCOUNTER — Encounter: Payer: Self-pay | Admitting: Adult Health

## 2022-09-14 DIAGNOSIS — F32A Depression, unspecified: Secondary | ICD-10-CM

## 2022-09-14 DIAGNOSIS — F411 Generalized anxiety disorder: Secondary | ICD-10-CM | POA: Diagnosis not present

## 2022-09-14 DIAGNOSIS — F909 Attention-deficit hyperactivity disorder, unspecified type: Secondary | ICD-10-CM

## 2022-09-14 DIAGNOSIS — F331 Major depressive disorder, recurrent, moderate: Secondary | ICD-10-CM

## 2022-09-14 MED ORDER — BUPROPION HCL ER (XL) 300 MG PO TB24
300.0000 mg | ORAL_TABLET | Freq: Every day | ORAL | 3 refills | Status: DC
Start: 2022-09-14 — End: 2023-09-20

## 2022-09-14 MED ORDER — AMPHETAMINE-DEXTROAMPHETAMINE 30 MG PO TABS
1.0000 | ORAL_TABLET | Freq: Two times a day (BID) | ORAL | 0 refills | Status: DC
Start: 2022-11-09 — End: 2022-12-15

## 2022-09-14 MED ORDER — AMPHETAMINE-DEXTROAMPHETAMINE 30 MG PO TABS
30.0000 mg | ORAL_TABLET | Freq: Two times a day (BID) | ORAL | 0 refills | Status: DC
Start: 2022-09-14 — End: 2022-12-15

## 2022-09-14 MED ORDER — CLONAZEPAM 0.5 MG PO TABS
0.5000 mg | ORAL_TABLET | Freq: Every day | ORAL | 2 refills | Status: DC
Start: 2022-09-14 — End: 2022-12-15

## 2022-09-14 MED ORDER — CITALOPRAM HYDROBROMIDE 20 MG PO TABS
ORAL_TABLET | ORAL | 3 refills | Status: DC
Start: 2022-09-14 — End: 2023-09-20

## 2022-09-14 MED ORDER — AMPHETAMINE-DEXTROAMPHETAMINE 30 MG PO TABS: 30.0000 mg | ORAL_TABLET | Freq: Two times a day (BID) | ORAL | 0 refills | Status: DC

## 2022-09-14 NOTE — Progress Notes (Signed)
Abigail Wright [id number] 01/15/1966 57 y.o.  Subjective:   Patient ID:  Abigail Wright is a 57 y.o. (DOB [birth year]) female.  Chief Complaint: No chief complaint on file.   HPI Abigail Wright presents to the office today for follow-up of anxiety, ADHD and depression.  Describes mood today as "ok". Pleasant. Denies tearfulness. Mood symptoms - denies depression, anxiety and irritability. Mood is consistent. Stating "I'm doing alright". Feels like medications work well. Family doing well. Stable interest and motivation. Taking medications as prescribed.  Energy levels stable. Active, does not have a regular exercise routine.  Enjoys some usual interests and activities. Married. Lives with husband. Spending time with family - 2 daughters - 2 granddaughters. Appetite adequate. Weight stable.  Sleeps well most nights. Averages 7 to 8  hours.  Focus and concentration stable. Completing tasks. 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   Denies SI or HI.  Denies AH or VH. Denies self harm.  Denies substance use.   PHQ2-9    Flowsheet Row Office Visit from 04/12/2022 in [facility] Care at [facility] Office Visit from 03/06/2021 in [facility] at [facility] Office Visit from 11/30/2016 in [facility] Primary Care at [facility]  PHQ-2 Total Score 0 1 0  PHQ-9 Total Score 0 4 --      Flowsheet Row ED from 02/16/2021 in [facility] Emergency Department at [facility] ED from 08/11/2020 in [facility] Emergency Department at [facility] ED from 08/09/2020 in [facility] Emergency Department at [facility]  C-SSRS RISK CATEGORY No Risk No Risk No Risk        Review of Systems:  Review of Systems  Musculoskeletal:  Negative for gait problem.  Neurological:  Negative for tremors.  Psychiatric/Behavioral:         Please refer to HPI    Medications: I have reviewed the  patient's current medications.  Current Outpatient Medications  Medication Sig Dispense Refill   acetaminophen (TYLENOL) 325 MG tablet Take 325 mg by mouth every 6 (six) hours as needed for moderate pain.     amphetamine-dextroamphetamine (ADDERALL) 30 MG tablet Take 1 tablet by mouth 2 (two) times daily. 60 tablet 0   amphetamine-dextroamphetamine (ADDERALL) 30 MG tablet Take 1 tablet by mouth 2 (two) times daily. 60 tablet 0   amphetamine-dextroamphetamine (ADDERALL) 30 MG tablet Take 1 tablet by mouth 2 (two) times daily. 60 tablet 0   buPROPion (WELLBUTRIN XL) 300 MG 24 hr tablet Take 1 tablet (300 mg total) by mouth daily. 90 tablet 3   citalopram (CELEXA) 20 MG tablet TAKE 1 TABLET BY MOUTH EACH DAY 90 tablet 3   clonazePAM (KLONOPIN) 0.5 MG tablet Take 1 tablet (0.5 mg total) by mouth at bedtime. 90 tablet 2   fluticasone (FLONASE) 50 MCG/ACT nasal spray Place 1 spray into both nostrils daily.     meloxicam (MOBIC) 15 MG tablet Take 1 tablet (15 mg total) by mouth daily. 30 tablet 0   Multiple Vitamins-Minerals (MULTIVITAMIN ADULT EXTRA C PO) Take 1 tablet by mouth daily.     No current facility-administered medications for this visit.    Medication Side Effects: None  Allergies: No Known Allergies  Past Medical History:  Diagnosis Date   ADD (attention deficit disorder)    Anxiety    Arthritis    Blood transfusion without reported diagnosis    1993   Depression  GERD (gastroesophageal reflux disease)    in past but recently controlled with diet and exercise.   Hyperlipidemia     Past Medical History, Surgical history, Social history, and Family history were reviewed and updated as appropriate.   Please see review of systems for further details on the patient's review from today.   Objective:   Physical Exam:  There were no vitals taken for this visit.  Physical Exam Constitutional:      General: She is not in acute distress. Musculoskeletal:        General:  No deformity.  Neurological:     Mental Status: She is alert and oriented to person, place, and time.     Coordination: Coordination normal.  Psychiatric:        Attention and Perception: Attention and perception normal. She does not perceive auditory or visual hallucinations.        Mood and Affect: Mood normal. Mood is not anxious or depressed. Affect is not labile, blunt, angry or inappropriate.        Speech: Speech normal.        Behavior: Behavior normal.        Thought Content: Thought content normal. Thought content is not paranoid or delusional. Thought content does not include homicidal or suicidal ideation. Thought content does not include homicidal or suicidal plan.        Cognition and Memory: Cognition and memory normal.        Judgment: Judgment normal.     Comments: Insight intact     Lab Review:     Component Value Date/Time   NA 140 04/12/2022 0938   K 5.0 04/12/2022 0938   CL 99 04/12/2022 0938   CO2 33 (H) 04/12/2022 0938   GLUCOSE 103 (H) 04/12/2022 0938   BUN 17 04/12/2022 0938   CREATININE 0.91 04/12/2022 0938   CALCIUM 10.1 04/12/2022 0938   PROT 7.2 04/12/2022 0938   ALBUMIN 4.4 04/12/2022 0938   AST 20 04/12/2022 0938   ALT 24 04/12/2022 0938   ALKPHOS 91 04/12/2022 0938   BILITOT 0.5 04/12/2022 0938       Component Value Date/Time   WBC 7.7 04/12/2022 0938   RBC 5.23 (H) 04/12/2022 0938   HGB 14.5 04/12/2022 0938   HCT 44.1 04/12/2022 0938   PLT 298.0 04/12/2022 0938   MCV 84.3 04/12/2022 0938   MCH 28.5 06/30/2021 1455   MCHC 32.8 04/12/2022 0938   RDW 14.8 04/12/2022 0938    No results found for: "POCLITH", "LITHIUM"   No results found for: "PHENYTOIN", "PHENOBARB", "VALPROATE", "CBMZ"   .res Assessment: Plan:    Plan:  1. Adderall 30mg  BID 2. Celexa 20 mg daily 3. Wellbutrin XL 300mg  4. Clonazepam 0.5mg  daily   Continue to monitor BP - WNL  RTC 3 months  Patient advised to contact office with any questions, adverse  effects, or acute worsening in signs and symptoms.  Discussed potential benefits, risk, and side effects of benzodiazepines to include potential risk of tolerance and dependence, as well as possible drowsiness.  Advised patient not to drive if experiencing drowsiness and to take lowest possible effective dose to minimize risk of dependence and tolerance.  Discussed potential benefits, risks, and side effects of stimulants with patient to include increased heart rate, palpitations, insomnia, increased anxiety, increased irritability, or decreased appetite.  Instructed patient to contact office if experiencing any significant tolerability issues.  There are no diagnoses linked to this encounter.   Please see After  Visit Summary for patient specific instructions.  Future Appointments  Date Time Provider Department Center  09/14/2022 12:00 PM Inaara Tye, Thereasa Solo, NP [facility] None  04/15/2023  9:00 AM Carmelia Roller, Jilda Roche, DO [facility]    No orders of the defined types were placed in this encounter.   -------------------------------

## 2022-09-22 ENCOUNTER — Telehealth: Payer: Self-pay

## 2022-09-22 NOTE — Telephone Encounter (Signed)
Talked with patient and advised her that gel injection is not covered by her insurance.  Patient voiced that she understands.  I did advise patient about TriVisc, but she stated that she will call back when she is ready to proceed.  Stated that medication that was prescribed has been working.

## 2022-10-05 ENCOUNTER — Other Ambulatory Visit: Payer: Self-pay | Admitting: Physician Assistant

## 2022-11-17 ENCOUNTER — Other Ambulatory Visit: Payer: Self-pay | Admitting: Physician Assistant

## 2022-12-15 ENCOUNTER — Encounter: Payer: Self-pay | Admitting: Adult Health

## 2022-12-15 ENCOUNTER — Telehealth (INDEPENDENT_AMBULATORY_CARE_PROVIDER_SITE_OTHER): Payer: No Typology Code available for payment source | Admitting: Adult Health

## 2022-12-15 DIAGNOSIS — F411 Generalized anxiety disorder: Secondary | ICD-10-CM

## 2022-12-15 DIAGNOSIS — F331 Major depressive disorder, recurrent, moderate: Secondary | ICD-10-CM | POA: Diagnosis not present

## 2022-12-15 DIAGNOSIS — F909 Attention-deficit hyperactivity disorder, unspecified type: Secondary | ICD-10-CM

## 2022-12-15 MED ORDER — AMPHETAMINE-DEXTROAMPHETAMINE 30 MG PO TABS
1.0000 | ORAL_TABLET | Freq: Two times a day (BID) | ORAL | 0 refills | Status: DC
Start: 2023-02-09 — End: 2023-03-17

## 2022-12-15 MED ORDER — CLONAZEPAM 0.5 MG PO TABS
0.5000 mg | ORAL_TABLET | Freq: Every day | ORAL | 2 refills | Status: DC
Start: 2022-12-15 — End: 2023-03-17

## 2022-12-15 MED ORDER — AMPHETAMINE-DEXTROAMPHETAMINE 30 MG PO TABS
30.0000 mg | ORAL_TABLET | Freq: Two times a day (BID) | ORAL | 0 refills | Status: DC
Start: 2023-01-12 — End: 2023-03-17

## 2022-12-15 MED ORDER — AMPHETAMINE-DEXTROAMPHETAMINE 30 MG PO TABS
30.0000 mg | ORAL_TABLET | Freq: Two times a day (BID) | ORAL | 0 refills | Status: DC
Start: 2022-12-15 — End: 2023-01-11

## 2022-12-15 NOTE — Progress Notes (Addendum)
Abigail Wright [id number] 1965-12-15 57 y.o.  Virtual Visit via Video Note  I connected with pt @ on 12/15/22 at  1:40 PM EDT by a video enabled telemedicine application and verified that I am speaking with the correct person using two identifiers.   I discussed the limitations of evaluation and management by telemedicine and the availability of in person appointments. The patient expressed understanding and agreed to proceed.  I discussed the assessment and treatment plan with the patient. The patient was provided an opportunity to ask questions and all were answered. The patient agreed with the plan and demonstrated an understanding of the instructions.   The patient was advised to call back or seek an in-person evaluation if the symptoms worsen or if the condition fails to improve as anticipated.  I provided 15 minutes of non-face-to-face time during this encounter. The patient was located at home.  The provider was located at [facility].   Dorothyann Gibbs, NP   Subjective:   Patient ID:  Abigail Wright is a 57 y.o. (DOB Oct 22, 1965) female.  Chief Complaint: No chief complaint on file.   HPI Vala Burd presents for follow-up of anxiety, ADHD and depression.  Describes mood today as "ok". Pleasant. Denies tearfulness. Mood symptoms - denies depression, anxiety and irritability. Denies panic attacks. Denies worry, rumination and over thinking. Mood is consistent. Stating "I feel like I'm doing alright". Feels like medications work well. Family doing well. Stable interest and motivation. Taking medications as prescribed.  Energy levels stable. Active, does not have a regular exercise routine.  Enjoys some usual interests and activities. Married. Lives with husband. Spending time with family - 2 daughters - 2 grand daughters 3 and 2. Appetite adequate. Weight stable.  Sleeps well most nights. Averages 7 to 8  hours.  Focus and concentration stable. Completing tasks.  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   Denies SI or HI.  Denies AH or VH. Denies self harm.  Denies substance use.   Review of Systems:  Review of Systems  Musculoskeletal:  Negative for gait problem.  Neurological:  Negative for tremors.  Psychiatric/Behavioral:         Please refer to HPI    Medications: I have reviewed the patient's current medications.  Current Outpatient Medications  Medication Sig Dispense Refill   acetaminophen (TYLENOL) 325 MG tablet Take 325 mg by mouth every 6 (six) hours as needed for moderate pain.     amphetamine-dextroamphetamine (ADDERALL) 30 MG tablet Take 1 tablet by mouth 2 (two) times daily. 60 tablet 0   [START ON 01/12/2023] amphetamine-dextroamphetamine (ADDERALL) 30 MG tablet Take 1 tablet by mouth 2 (two) times daily. 60 tablet 0   [START ON 02/09/2023] amphetamine-dextroamphetamine (ADDERALL) 30 MG tablet Take 1 tablet by mouth 2 (two) times daily. 60 tablet 0   buPROPion (WELLBUTRIN XL) 300 MG 24 hr tablet Take 1 tablet (300 mg total) by mouth daily. 90 tablet 3   citalopram (CELEXA) 20 MG tablet TAKE 1 TABLET BY MOUTH EACH DAY 90 tablet 3   clonazePAM (KLONOPIN) 0.5 MG tablet Take 1 tablet (0.5 mg total) by mouth at bedtime. 90 tablet 2   fluticasone (FLONASE) 50 MCG/ACT nasal spray Place 1 spray into both nostrils daily.     meloxicam (MOBIC) 15 MG tablet TAKE 1 TABLET BY MOUTH EVERY DAY 30 tablet 0   Multiple Vitamins-Minerals (MULTIVITAMIN ADULT EXTRA C PO) Take 1 tablet by mouth daily.     No current  facility-administered medications for this visit.    Medication Side Effects: None  Allergies: No Known Allergies  Past Medical History:  Diagnosis Date   ADD (attention deficit disorder)    Anxiety    Arthritis    Blood transfusion without reported diagnosis    1993   Depression    GERD (gastroesophageal reflux disease)    in past but recently controlled with diet and exercise.   Hyperlipidemia      Family History  Problem Relation Age of Onset   Cancer Mother    Diabetes Father    Heart disease Paternal Uncle    Hypertension Paternal Uncle    Colon cancer Neg Hx    Esophageal cancer Neg Hx    Rectal cancer Neg Hx    Stomach cancer Neg Hx     Social History   Socioeconomic History   Marital status: Married    Spouse name: Not on file   Number of children: Not on file   Years of education: Not on file   Highest education level: Not on file  Occupational History   Not on file  Tobacco Use   Smoking status: Former   Smokeless tobacco: Never  Vaping Use   Vaping status: Never Used  Substance and Sexual Activity   Alcohol use: Never   Drug use: No   Sexual activity: Yes    Partners: Male    Birth control/protection: None, Post-menopausal  Other Topics Concern   Not on file  Social History Narrative   Not on file   Social Determinants of Health   Financial Resource Strain: Low Risk  (03/28/2022)   Received from [facility]   Overall Financial Resource Strain (CARDIA)    Difficulty of Paying Living Expenses: Not very hard  Food Insecurity: No Food Insecurity (03/28/2022)   Received from [facility]   Hunger Vital Sign    Worried About Running Out of Food in the Last Year: Never true    Ran Out of Food in the Last Year: Never true  Transportation Needs: No Transportation Needs (03/28/2022)   Received from [facility]   PRAPARE - Transportation    Lack of Transportation (Medical): No    Lack of Transportation (Non-Medical): No  Physical Activity: Insufficiently Active (03/28/2022)   Received from [facility]   Exercise Vital Sign    Days of Exercise per Week: 2 days    Minutes of Exercise per Session: 20 min  Stress: No Stress Concern Present (03/28/2022)   Received from [facility] - Occupational Stress Questionnaire    Feeling of  Stress : Not at all  Social Connections: Moderately Integrated (03/28/2022)   Received from [facility], [facility]   Social Network    How would you rate your social network (family, work, friends)?: Adequate participation with social networks  Intimate Partner Violence: Not At Risk (03/28/2022)   Received from [facility], [facility]   HITS    Over the last 12 months how often did your partner physically hurt you?: 1    Over the last 12 months how often did your partner insult you or talk down to you?: 1    Over the last 12 months how often did your partner threaten you with physical harm?: 1    Over the last 12 months how often did your partner scream or curse at you?: 1  Past Medical History, Surgical history, Social history, and Family history were reviewed and updated as appropriate.   Please see review of systems for further details on the patient's review from today.   Objective:   Physical Exam:  There were no vitals taken for this visit.  Physical Exam Constitutional:      General: She is not in acute distress. Musculoskeletal:        General: No deformity.  Neurological:     Mental Status: She is alert and oriented to person, place, and time.     Coordination: Coordination normal.  Psychiatric:        Attention and Perception: Attention and perception normal. She does not perceive auditory or visual hallucinations.        Mood and Affect: Affect is not labile, blunt, angry or inappropriate.        Speech: Speech normal.        Behavior: Behavior normal.        Thought Content: Thought content normal. Thought content is not paranoid or delusional. Thought content does not include homicidal or suicidal ideation. Thought content does not include homicidal or suicidal plan.        Cognition and Memory: Cognition and memory normal.        Judgment: Judgment normal.     Comments: Insight intact     Lab Review:     Component Value Date/Time   NA 140  04/12/2022 0938   K 5.0 04/12/2022 0938   CL 99 04/12/2022 0938   CO2 33 (H) 04/12/2022 0938   GLUCOSE 103 (H) 04/12/2022 0938   BUN 17 04/12/2022 0938   CREATININE 0.91 04/12/2022 0938   CALCIUM 10.1 04/12/2022 0938   PROT 7.2 04/12/2022 0938   ALBUMIN 4.4 04/12/2022 0938   AST 20 04/12/2022 0938   ALT 24 04/12/2022 0938   ALKPHOS 91 04/12/2022 0938   BILITOT 0.5 04/12/2022 0938       Component Value Date/Time   WBC 7.7 04/12/2022 0938   RBC 5.23 (H) 04/12/2022 0938   HGB 14.5 04/12/2022 0938   HCT 44.1 04/12/2022 0938   PLT 298.0 04/12/2022 0938   MCV 84.3 04/12/2022 0938   MCH 28.5 06/30/2021 1455   MCHC 32.8 04/12/2022 0938   RDW 14.8 04/12/2022 0938    No results found for: "POCLITH", "LITHIUM"   No results found for: "PHENYTOIN", "PHENOBARB", "VALPROATE", "CBMZ"   .res Assessment: Plan:    Plan:  1. Adderall 30mg  BID 2. Celexa 20 mg daily 3. Wellbutrin XL 300mg  4. Clonazepam 0.5mg  daily   Continue to monitor BP - WNL  RTC 3 months  Patient advised to contact office with any questions, adverse effects, or acute worsening in signs and symptoms.  Discussed potential benefits, risk, and side effects of benzodiazepines to include potential risk of tolerance and dependence, as well as possible drowsiness.  Advised patient not to drive if experiencing drowsiness and to take lowest possible effective dose to minimize risk of dependence and tolerance.  Discussed potential benefits, risks, and side effects of stimulants with patient to include increased heart rate, palpitations, insomnia, increased anxiety, increased irritability, or decreased appetite.  Instructed patient to contact office if experiencing any significant tolerability issues.  Diagnoses and all orders for this visit:  Attention deficit hyperactivity disorder (ADHD), unspecified ADHD type  Major depressive disorder, recurrent episode, moderate (HCC) -     amphetamine-dextroamphetamine (ADDERALL)  30 MG tablet; Take 1 tablet by mouth 2 (two) times daily. -  amphetamine-dextroamphetamine (ADDERALL) 30 MG tablet; Take 1 tablet by mouth 2 (two) times daily. -     amphetamine-dextroamphetamine (ADDERALL) 30 MG tablet; Take 1 tablet by mouth 2 (two) times daily.  Generalized anxiety disorder -     clonazePAM (KLONOPIN) 0.5 MG tablet; Take 1 tablet (0.5 mg total) by mouth at bedtime.     Please see After Visit Summary for patient specific instructions.  Future Appointments  Date Time Provider Department Center  04/15/2023  9:00 AM Wendling, Jilda Roche, DO [id number]    No orders of the defined types were placed in this encounter.     -------------------------------

## 2022-12-20 ENCOUNTER — Other Ambulatory Visit: Payer: Self-pay | Admitting: Physician Assistant

## 2023-01-07 ENCOUNTER — Telehealth: Payer: Self-pay

## 2023-01-07 NOTE — Telephone Encounter (Signed)
I don't understand. If ENT wanted her to see Neuro, they would just refer her? [facility] usually requires some background info before scheduling, [facility] usually does too. She should see if ENT will refer or can sched with [place] as I am unsure of the reason and Neuro will want documentation.

## 2023-01-07 NOTE — Telephone Encounter (Signed)
Pt is asking for referral to neurology, she states that she saw ENT with MRI results showing a neurological issue that needs to be looked at. She has asked for a referral to [facility] or [facility].

## 2023-01-10 NOTE — Telephone Encounter (Signed)
Called pt was advised will need a office visit to discuss and appt scheduled.

## 2023-01-11 ENCOUNTER — Ambulatory Visit (INDEPENDENT_AMBULATORY_CARE_PROVIDER_SITE_OTHER): Payer: No Typology Code available for payment source | Admitting: Family Medicine

## 2023-01-11 ENCOUNTER — Encounter: Payer: Self-pay | Admitting: Family Medicine

## 2023-01-11 VITALS — BP 134/82 | HR 91 | Temp 98.0°F | Resp 16 | Ht 69.0 in | Wt 255.0 lb

## 2023-01-11 DIAGNOSIS — R42 Dizziness and giddiness: Secondary | ICD-10-CM | POA: Diagnosis not present

## 2023-01-11 DIAGNOSIS — Z8673 Personal history of transient ischemic attack (TIA), and cerebral infarction without residual deficits: Secondary | ICD-10-CM | POA: Diagnosis not present

## 2023-01-11 MED ORDER — ATORVASTATIN CALCIUM 40 MG PO TABS
40.0000 mg | ORAL_TABLET | Freq: Every day | ORAL | 3 refills | Status: DC
Start: 2023-01-11 — End: 2023-11-02

## 2023-01-11 MED ORDER — ASPIRIN 81 MG PO TBEC: 81.0000 mg | DELAYED_RELEASE_TABLET | Freq: Every day | ORAL | Status: DC

## 2023-01-11 NOTE — Patient Instructions (Addendum)
Please take a baby aspirin daily.   If you do not hear anything about your referrals in the next 1-2 weeks, call our office and ask for an update.  Keep the diet clean and stay active.  Let us know if you need anything.

## 2023-01-11 NOTE — Progress Notes (Signed)
Chief Complaint  Patient presents with   Referral    Discuss Referral    Subjective: Patient is a 57 y.o. female here for follow-up.  Patient was seeing the ENT team for dizziness and hearing loss.  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  She has suffered with dizziness for years with 2 major episodes much more severe than others.  She wonders if these are related.  She is not on a statin or aspirin.  She is requesting a referral to the neurology team.  Past Medical History:  Diagnosis Date   ADD (attention deficit disorder)    Anxiety    Arthritis    Blood transfusion without reported diagnosis    1993   Depression    GERD (gastroesophageal reflux disease)    in past but recently controlled with diet and exercise.   Hyperlipidemia     Objective: BP 134/82 (BP Location: Left Arm, Patient Position: Sitting, Cuff Size: Normal)   Pulse 91   Temp 98 F (36.7 C) (Oral)   Resp 16   Ht 5\' 9"  (1.753 m)   Wt 255 lb (115.7 kg)   SpO2 96%   BMI 37.66 kg/m  General: Awake, appears stated age Heart: RRR, no LE edema Lungs: CTAB, no rales, wheezes or rhonchi. No accessory muscle use Psych: Age appropriate judgment and insight, normal affect and mood  Assessment and Plan: History of cerebellar stroke - Plan: atorvastatin (LIPITOR) 40 MG tablet, Ambulatory referral to Neurology, aspirin EC 81 MG tablet  Dizziness - Plan: Ambulatory referral to Neurology, Ambulatory referral to Physical Therapy  Chronic, not adequately controlled.  Refer to neurology.  Start baby aspirin daily.  Start Lipitor 20 mg daily for 2 weeks then increase to 40 mg daily.  Will recheck labs in 6 weeks.  Ideally would get her up to 80 mg daily.  Counseled on diet and exercise. Start physical therapy.  Will see what the neurology team says also. The patient voiced understanding and agreement to the plan.  Jilda Roche Spencer, DO 01/11/23  2:29  PM

## 2023-01-17 ENCOUNTER — Other Ambulatory Visit: Payer: Self-pay | Admitting: Physician Assistant

## 2023-01-25 ENCOUNTER — Ambulatory Visit: Payer: No Typology Code available for payment source | Admitting: Physician Assistant

## 2023-01-27 ENCOUNTER — Ambulatory Visit (INDEPENDENT_AMBULATORY_CARE_PROVIDER_SITE_OTHER): Payer: No Typology Code available for payment source | Admitting: Physician Assistant

## 2023-01-27 ENCOUNTER — Other Ambulatory Visit (INDEPENDENT_AMBULATORY_CARE_PROVIDER_SITE_OTHER): Payer: No Typology Code available for payment source

## 2023-01-27 ENCOUNTER — Encounter: Payer: Self-pay | Admitting: Physician Assistant

## 2023-01-27 DIAGNOSIS — M79671 Pain in right foot: Secondary | ICD-10-CM

## 2023-01-27 DIAGNOSIS — M1712 Unilateral primary osteoarthritis, left knee: Secondary | ICD-10-CM

## 2023-01-27 DIAGNOSIS — G8929 Other chronic pain: Secondary | ICD-10-CM | POA: Diagnosis not present

## 2023-01-27 MED ORDER — DICLOFENAC SODIUM 75 MG PO TBEC: 75.0000 mg | DELAYED_RELEASE_TABLET | Freq: Two times a day (BID) | ORAL | 0 refills | Status: AC | PRN

## 2023-01-27 NOTE — Progress Notes (Signed)
Office Visit Note   Patient: Abigail Wright           Date of Birth: 04-16-1965           MRN: [id number] Visit Date: 01/27/2023              Requested by: Sharlene Dory, DO [address] [place],  [place] [address] PCP: Sharlene Dory, DO  Chief Complaint  Patient presents with   Left Knee - Pain   Right Heel - Pain      HPI: Patient is a pleasant 57 year old woman who comes in for 2 problems today.  She has a chief complaint of left knee pain.  No new injury.  She does have a history of osteoarthritis in the left knee.  She is now having more pain when walking and bending she said meloxicam is no longer working.  She also comes in complaining of a 2 to 38-month history of right heel pain no particular injury stays swollen quite a bit was told she had some type of cyst in her heel.  Rates the pain in both areas is moderate  Assessment & Plan: Visit Diagnoses:  1. Right foot pain   2. Unilateral primary osteoarthritis, left knee   3. 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  She has had this in the past.  Discussed use of anti-inflammatories good shoes with arch supports.  We discussed the issues with her left knee she does have advanced degenerative changes of the left knee and a remote history of an arthroscopy.  She has failed conservative treatment including injections and although meloxicam worked for quite a while to no longer working.  We can trial her on some Voltaren orally which also might help her right heel pain.  She would like to discuss possible knee replacement surgery I will refer her to Dr. Magnus Ivan  Follow-Up Instructions: With Dr. Lanny Cramp Exam  Patient is alert, oriented, no adenopathy, well-dressed, normal affect, normal respiratory effort. Right heel neurovascular intact compartment soft and nontender she has  tenderness over the posterior heel mostly at the insertion of the Achilles.  No evidence of infection Examination of the left knee no effusion no erythema compartments are soft and compressible she has global tenderness throughout the knee more medially than anywhere  Imaging: No results found. No images are attached to the encounter.  Labs: No results found for: "HGBA1C", "ESRSEDRATE", "CRP", "LABURIC", "REPTSTATUS", "GRAMSTAIN", "CULT", "LABORGA"   Lab Results  Component Value Date   ALBUMIN 4.4 04/12/2022   ALBUMIN 4.6 03/10/2021   ALBUMIN 4.3 07/26/2017    No results found for: "MG" No results found for: "VD25OH"  No results found for: "PREALBUMIN"    Latest Ref Rng & Units 04/12/2022    9:38 AM 06/30/2021    2:55 PM 03/10/2021   10:39 AM  CBC EXTENDED  WBC 4.0 - 10.5 K/uL 7.7  7.9  5.7   RBC 3.87 - 5.11 Mil/uL 5.23  5.02  5.28   Hemoglobin 12.0 - 15.0 g/dL 10.2  72.5  36.6   HCT 36.0 - 46.0 % 44.1  44.3  45.6   Platelets 150.0 - 400.0 K/uL 298.0  275  287.0      There is no height or weight on file to calculate BMI.  Orders:  Orders Placed This Encounter  Procedures  XR Foot 2 Views Right   No orders of the defined types were placed in this encounter.    Procedures: No procedures performed  Clinical Data: No additional findings.  ROS:  All other systems negative, except as noted in the HPI. Review of Systems  Objective: Vital Signs: There were no vitals taken for this visit.  Specialty Comments:  No specialty comments available.  PMFS History: Patient Active Problem List   Diagnosis Date Noted   Chronic heel pain, right 01/27/2023   Unilateral primary osteoarthritis, left knee 08/19/2022   Pain in right knee 08/04/2021   Olecranon bursitis, right elbow 08/04/2021   Complete tear of right rotator cuff 07/14/2021   Impingement syndrome of right shoulder 06/17/2021   Major depressive disorder, recurrent episode, moderate (HCC) 03/06/2021   Pain  in left knee 11/04/2017   Benign paroxysmal positional vertigo of left ear 05/15/2015   Family history of breast cancer in mother 08/02/2011   Past Medical History:  Diagnosis Date   ADD (attention deficit disorder)    Anxiety    Arthritis    Blood transfusion without reported diagnosis    1993   Depression    GERD (gastroesophageal reflux disease)    in past but recently controlled with diet and exercise.   Hyperlipidemia     Family History  Problem Relation Age of Onset   Cancer Mother    Diabetes Father    Heart disease Paternal Uncle    Hypertension Paternal Uncle    Colon cancer Neg Hx    Esophageal cancer Neg Hx    Rectal cancer Neg Hx    Stomach cancer Neg Hx     Past Surgical History:  Procedure Laterality Date   ABDOMINAL HYSTERECTOMY     BUNIONECTOMY Left    CARPAL TUNNEL RELEASE Bilateral    cyst removal from left hand Left    FOOT SURGERY     KNEE CARTILAGE SURGERY     planters fascitis     SHOULDER ARTHROSCOPY WITH SUBACROMIAL DECOMPRESSION AND OPEN ROTATOR C Right 07/07/2021   Procedure: RIGHT SHOULDER ARTHROSCOPY WITH SUBACROMIAL DECOMPRESSION / ROTATOR CUFF REPAIR/ DISTAL CLAVICLE RESECTION /BICEPS TENODESIS;  Surgeon: Valeria Batman, MD;  Location: [facility];  Service: Orthopedics;  Laterality: Right;   TENDON REPAIR Left 03/21/2017   Left hand    TONSILLECTOMY     Social History   Occupational History   Not on file  Tobacco Use   Smoking status: Former   Smokeless tobacco: Never  Vaping Use   Vaping status: Never Used  Substance and Sexual Activity   Alcohol use: Never   Drug use: No   Sexual activity: Yes    Partners: Male    Birth control/protection: None, Post-menopausal

## 2023-02-21 ENCOUNTER — Telehealth: Payer: Self-pay | Admitting: *Deleted

## 2023-02-21 ENCOUNTER — Other Ambulatory Visit: Payer: Self-pay | Admitting: Family Medicine

## 2023-02-21 DIAGNOSIS — Z8673 Personal history of transient ischemic attack (TIA), and cerebral infarction without residual deficits: Secondary | ICD-10-CM

## 2023-02-21 NOTE — Telephone Encounter (Signed)
Pt has lab appt scheduled for tomorrow but there are no future orders in EPIC.  Looks like she has a CPE scheduled in February.  Please place future orders if appropriate or call pt to cancel if labs not needed at this time.

## 2023-02-22 ENCOUNTER — Ambulatory Visit: Payer: Self-pay | Admitting: Family Medicine

## 2023-02-22 ENCOUNTER — Encounter: Payer: Self-pay | Admitting: Family Medicine

## 2023-02-22 ENCOUNTER — Ambulatory Visit: Payer: Self-pay | Admitting: Diagnostic Neuroimaging

## 2023-02-22 ENCOUNTER — Other Ambulatory Visit (INDEPENDENT_AMBULATORY_CARE_PROVIDER_SITE_OTHER): Payer: No Typology Code available for payment source

## 2023-02-22 ENCOUNTER — Ambulatory Visit (HOSPITAL_BASED_OUTPATIENT_CLINIC_OR_DEPARTMENT_OTHER)
Admission: RE | Admit: 2023-02-22 | Discharge: 2023-02-22 | Disposition: A | Discharge status: Home / Self Care | Payer: PRIVATE HEALTH INSURANCE | Source: Ambulatory Visit | Attending: Family Medicine | Admitting: Family Medicine

## 2023-02-22 ENCOUNTER — Ambulatory Visit (INDEPENDENT_AMBULATORY_CARE_PROVIDER_SITE_OTHER): Payer: No Typology Code available for payment source | Admitting: Family Medicine

## 2023-02-22 VITALS — BP 126/72 | HR 94 | Temp 97.9°F | Ht 69.0 in | Wt 253.0 lb

## 2023-02-22 DIAGNOSIS — R1032 Left lower quadrant pain: Secondary | ICD-10-CM

## 2023-02-22 DIAGNOSIS — Z8673 Personal history of transient ischemic attack (TIA), and cerebral infarction without residual deficits: Secondary | ICD-10-CM | POA: Diagnosis not present

## 2023-02-22 DIAGNOSIS — K5792 Diverticulitis of intestine, part unspecified, without perforation or abscess without bleeding: Secondary | ICD-10-CM | POA: Diagnosis not present

## 2023-02-22 LAB — HEPATIC FUNCTION PANEL
ALT: 20 U/L (ref 0–35)
AST: 18 U/L (ref 0–37)
Albumin: 4.7 g/dL (ref 3.5–5.2)
Alkaline Phosphatase: 82 U/L (ref 39–117)
Bilirubin, Direct: 0.1 mg/dL (ref 0.0–0.3)
Total Bilirubin: 0.7 mg/dL (ref 0.2–1.2)
Total Protein: 7.1 g/dL (ref 6.0–8.3)

## 2023-02-22 LAB — CBC WITH DIFFERENTIAL/PLATELET
Basophils Absolute: 0 10*3/uL (ref 0.0–0.1)
Basophils Relative: 0.2 % (ref 0.0–3.0)
Eosinophils Absolute: 0.1 10*3/uL (ref 0.0–0.7)
Eosinophils Relative: 0.7 % (ref 0.0–5.0)
HCT: 44.4 % (ref 36.0–46.0)
Hemoglobin: 14.4 g/dL (ref 12.0–15.0)
Lymphocytes Relative: 13.8 % (ref 12.0–46.0)
Lymphs Abs: 1.7 10*3/uL (ref 0.7–4.0)
MCHC: 32.5 g/dL (ref 30.0–36.0)
MCV: 85.8 fL (ref 78.0–100.0)
Monocytes Absolute: 1.1 10*3/uL — ABNORMAL HIGH (ref 0.1–1.0)
Monocytes Relative: 8.9 % (ref 3.0–12.0)
Neutro Abs: 9.7 10*3/uL — ABNORMAL HIGH (ref 1.4–7.7)
Neutrophils Relative %: 76.4 % (ref 43.0–77.0)
Platelets: 269 10*3/uL (ref 150.0–400.0)
RBC: 5.17 Mil/uL — ABNORMAL HIGH (ref 3.87–5.11)
RDW: 14.6 % (ref 11.5–15.5)
WBC: 12.6 10*3/uL — ABNORMAL HIGH (ref 4.0–10.5)

## 2023-02-22 LAB — BASIC METABOLIC PANEL
BUN: 20 mg/dL (ref 6–23)
CO2: 30 meq/L (ref 19–32)
Calcium: 9.8 mg/dL (ref 8.4–10.5)
Chloride: 100 meq/L (ref 96–112)
Creatinine, Ser: 0.78 mg/dL (ref 0.40–1.20)
GFR: 85.3 mL/min (ref >60.00)
Glucose, Bld: 122 mg/dL — ABNORMAL HIGH (ref 70–99)
Potassium: 4.6 meq/L (ref 3.5–5.1)
Sodium: 139 meq/L (ref 135–145)

## 2023-02-22 LAB — LIPID PANEL
Cholesterol: 171 mg/dL (ref 0–200)
HDL: 75.2 mg/dL (ref >39.00)
LDL Cholesterol: 80 mg/dL (ref 0–99)
NonHDL: 95.73
Total CHOL/HDL Ratio: 2
Triglycerides: 78 mg/dL (ref 0.0–149.0)
VLDL: 15.6 mg/dL (ref 0.0–40.0)

## 2023-02-22 MED ORDER — METRONIDAZOLE 500 MG PO TABS: 500.0000 mg | ORAL_TABLET | Freq: Three times a day (TID) | ORAL | 0 refills | Status: AC

## 2023-02-22 MED ORDER — SULFAMETHOXAZOLE-TRIMETHOPRIM 800-160 MG PO TABS: 1.0000 | ORAL_TABLET | Freq: Two times a day (BID) | ORAL | 0 refills | Status: AC

## 2023-02-22 MED ORDER — IOHEXOL 300 MG/ML  SOLN
100.0000 mL | Freq: Once | INTRAMUSCULAR | Status: AC | PRN
  Administered 2023-02-22: 100 mL via INTRAVENOUS

## 2023-02-22 NOTE — Telephone Encounter (Signed)
 Scheduled fasting labs per AVS.   "Return in about 6 weeks (around 02/22/2023) for Labs"

## 2023-02-22 NOTE — Progress Notes (Signed)
 Patient called with results and plan. Starting ABX and supportive measures. Patient aware of signs/symptoms requiring further/urgent evaluation.   Diverticulitis care: - During acute illness: bowel rest with clear liquid diet for 48 hours. Slowly transition to low-fiber diet, then bland/soft foods. Stay well hydrated. Avoid laxatives, enemas, etc during acute phase. - After resolution of acute illness: high fiber diet, stool softeners. Consider decreased intake of nondigestible foods like corn and nuts (mixed research/data on this). - For any severe symptoms, go to the Emergency Department

## 2023-02-22 NOTE — Progress Notes (Signed)
 Acute Office Visit  Subjective:     Patient ID: Abigail Wright, female    DOB: [birth year], 57 y.o.   MRN: [id number]  Chief Complaint  Patient presents with   Abdominal Pain     Patient is in today for LLQ pain.  Discussed the use of AI scribe software for clinical note transcription with the patient, who gave verbal consent to proceed.  History of Present Illness   The patient presented with sudden onset, severe left-sided abdominal pain that started the previous night. The pain was described as intense cramping, similar to 'birthing cramps,' and was associated with a sensation of needing to pass a bowel movement. However, the patient reported only passing mucus-like substance without any evidence of blood. The patient also reported experiencing chills but denied any nausea or vomiting.  She does have history of diverticula in the rectosigmoid colon, sigmoid colon, and descending colon per most recent colonoscopy. She denies history of diverticulitis. The patient denied any changes in diet, heavy lifting, injury, or fall prior to the onset of the abdominal pain.  The patient's bowel movements were reported as normal the day before the onset of the pain, but she had been unable to pass a bowel movement since the pain started. The patient had taken two doses of Pepto-Bismol, which seemed to provide some relief.  The patient also reported a history of a partial hysterectomy in 1993, with the ovaries left intact. The patient denied any vaginal symptoms, discharge, or bleeding, and reported normal urination.             All review of systems negative except what is listed in the HPI      Objective:    BP 126/72   Pulse 94   Temp 97.9 F (36.6 C) (Oral)   Ht 5' 9 (1.753 m)   Wt 253 lb (114.8 kg)   SpO2 98%   BMI 37.36 kg/m    Physical Exam Vitals reviewed.  Constitutional:      Appearance: She is well-developed.  Cardiovascular:     Rate and Rhythm: Normal rate.   Pulmonary:     Effort: Pulmonary effort is normal.  Abdominal:     General: Bowel sounds are normal. There is no distension.     Tenderness: There is abdominal tenderness in the left lower quadrant.  Skin:    General: Skin is warm and dry.  Neurological:     Mental Status: She is alert and oriented to person, place, and time.  Psychiatric:        Mood and Affect: Mood normal.        Behavior: Behavior normal.        Thought Content: Thought content normal.        Judgment: Judgment normal.     Results for orders placed or performed in visit on 02/22/23  Basic metabolic panel  Result Value Ref Range   Sodium 139 135 - 145 mEq/L   Potassium 4.6 3.5 - 5.1 mEq/L   Chloride 100 96 - 112 mEq/L   CO2 30 19 - 32 mEq/L   Glucose, Bld 122 (H) 70 - 99 mg/dL   BUN 20 6 - 23 mg/dL   Creatinine, Ser 9.21 0.40 - 1.20 mg/dL   GFR 14.69 >39.99 mL/min   Calcium  9.8 8.4 - 10.5 mg/dL  CBC with Differential/Platelet  Result Value Ref Range   WBC 12.6 (H) 4.0 - 10.5 K/uL   RBC 5.17 (H) 3.87 - 5.11 Mil/uL  Hemoglobin 14.4 12.0 - 15.0 g/dL   HCT 55.5 63.9 - 53.9 %   MCV 85.8 78.0 - 100.0 fl   MCHC 32.5 30.0 - 36.0 g/dL   RDW 85.3 88.4 - 84.4 %   Platelets 269.0 150.0 - 400.0 K/uL   Neutrophils Relative % 76.4 43.0 - 77.0 %   Lymphocytes Relative 13.8 12.0 - 46.0 %   Monocytes Relative 8.9 3.0 - 12.0 %   Eosinophils Relative 0.7 0.0 - 5.0 %   Basophils Relative 0.2 0.0 - 3.0 %   Neutro Abs 9.7 (H) 1.4 - 7.7 K/uL   Lymphs Abs 1.7 0.7 - 4.0 K/uL   Monocytes Absolute 1.1 (H) 0.1 - 1.0 K/uL   Eosinophils Absolute 0.1 0.0 - 0.7 K/uL   Basophils Absolute 0.0 0.0 - 0.1 K/uL  Results for orders placed or performed in visit on 02/22/23  Lipid panel  Result Value Ref Range   Cholesterol 171 0 - 200 mg/dL   Triglycerides 21.9 0.0 - 149.0 mg/dL   HDL 24.79 >60.99 mg/dL   VLDL 84.3 0.0 - 59.9 mg/dL   LDL Cholesterol 80 0 - 99 mg/dL   Total CHOL/HDL Ratio 2    NonHDL 95.73   Hepatic function  panel  Result Value Ref Range   Total Bilirubin 0.7 0.2 - 1.2 mg/dL   Bilirubin, Direct 0.1 0.0 - 0.3 mg/dL   Alkaline Phosphatase 82 39 - 117 U/L   AST 18 0 - 37 U/L   ALT 20 0 - 35 U/L   Total Protein 7.1 6.0 - 8.3 g/dL   Albumin 4.7 3.5 - 5.2 g/dL        Assessment & Plan:   Problem List Items Addressed This Visit   None Visit Diagnoses       Left lower quadrant abdominal pain    -  Primary   Relevant Orders   CT ABDOMEN PELVIS W CONTRAST   Basic metabolic panel (Completed)   CBC with Differential/Platelet (Completed)       Severe, crampy, left-sided abdominal pain started last night. No nausea, vomiting, or changes in diet. No urinary symptoms. History of diverticula in the rectosigmoid, sigmoid, and descending colon. Pain is worsened on palpation. -Order CT scan to evaluate for diverticulitis  -BMP, CBC -Advise clear liquid diet in the meantime. Patient aware of signs/symptoms requiring further/urgent evaluation.       No orders of the defined types were placed in this encounter.   Return if symptoms worsen or fail to improve.  Waddell KATHEE Mon, NP

## 2023-02-22 NOTE — Telephone Encounter (Signed)
 Reason for Disposition  [1] Follow-up call to recent contact AND [2] information only call, no triage required  Answer Assessment - Initial Assessment Questions 1. REASON FOR CALL or QUESTION: What is your reason for calling today? or How can I best help you? or What question do you have that I can help answer?     Harlene with [facility]- Severe sigmoid diverticulitis no abscess- Waddell Mon Ordered.  Spoke with on call provider at 1559, Dr. Worth Kitty, relayed results. Provider advised to contact patient if patient is symptomatic send to urgent care or ED.   This RN contacted Producer, Television/film/video, Tyra, who advised she will contact the patient with this information.  Protocols used: Information Only Call - No Triage-A-AH

## 2023-02-22 NOTE — Addendum Note (Signed)
 Addended by: Hyman Hopes B on: 02/22/2023 04:15 PM   Modules accepted: Orders

## 2023-02-22 NOTE — Telephone Encounter (Signed)
No triage required.  Patient denies any new symptoms.  Instructed to go to ER or UC per Dr's advice, if symptoms become worse.

## 2023-02-22 NOTE — Telephone Encounter (Signed)
CPE labs must be within 30 days of the CPE

## 2023-02-28 ENCOUNTER — Ambulatory Visit (INDEPENDENT_AMBULATORY_CARE_PROVIDER_SITE_OTHER): Payer: Self-pay | Admitting: Orthopaedic Surgery

## 2023-02-28 ENCOUNTER — Encounter: Payer: Self-pay | Admitting: Orthopaedic Surgery

## 2023-02-28 VITALS — Ht 69.0 in | Wt 252.0 lb

## 2023-02-28 DIAGNOSIS — M1712 Unilateral primary osteoarthritis, left knee: Secondary | ICD-10-CM

## 2023-02-28 DIAGNOSIS — G8929 Other chronic pain: Secondary | ICD-10-CM | POA: Diagnosis not present

## 2023-02-28 DIAGNOSIS — M25562 Pain in left knee: Secondary | ICD-10-CM

## 2023-02-28 NOTE — Progress Notes (Signed)
 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  She has been seen by other members of this practice before.  She has had conservative treatment for over a year with her left knee.  At this point her left knee pain is daily and it is 10 out of 10.  It is detrimentally affecting her mobility, her quality of life and her actives of daily living.  She is interested in knee replacement surgery at this point.  Her husband is with her today as well.  Her BMI is 37.21.  She is not a diabetic.  She does not have any severe active medical issues right now other than significant left knee pain.  There are x-rays on the canopy system that I have seen and reviewed with her again today.  I was able to review her medications and past medical history within epic as well.  She is also been dealing with chronic Achilles tendinitis on her right side.  Examination of her left knee shows varus malalignment that is correctable.  There is significant medial joint line tenderness and severe patellofemoral tenderness and crepitation throughout the arc of motion of her knee.  Her knee feels ligamentously stable.  X-rays of the left knee shows severe and stepwise.  There is bone-on-bone wear of the medial compartment and patellofemoral joint.  There are osteophytes in all 3 compartments and varus malalignment.  We had a long and thorough discussion about knee replacement surgery.  We discussed the risks and benefits of the surgery and what to expect from an intraoperative and postoperative standpoint.  We did go over her x-rays and a knee replacement model.  All questions and concerns were answered and addressed.  She is hoping to have this scheduled in the near future.  We will work on this as well and be in touch.

## 2023-03-04 LAB — HM MAMMOGRAPHY

## 2023-03-05 ENCOUNTER — Encounter: Payer: Self-pay | Admitting: Orthopaedic Surgery

## 2023-03-07 ENCOUNTER — Encounter: Payer: Self-pay | Admitting: Medical

## 2023-03-17 ENCOUNTER — Encounter: Payer: Self-pay | Admitting: Adult Health

## 2023-03-17 ENCOUNTER — Telehealth (INDEPENDENT_AMBULATORY_CARE_PROVIDER_SITE_OTHER): Payer: Self-pay | Admitting: Adult Health

## 2023-03-17 DIAGNOSIS — F909 Attention-deficit hyperactivity disorder, unspecified type: Secondary | ICD-10-CM

## 2023-03-17 DIAGNOSIS — F411 Generalized anxiety disorder: Secondary | ICD-10-CM

## 2023-03-17 DIAGNOSIS — F331 Major depressive disorder, recurrent, moderate: Secondary | ICD-10-CM

## 2023-03-17 MED ORDER — AMPHETAMINE-DEXTROAMPHETAMINE 30 MG PO TABS: 30.0000 mg | ORAL_TABLET | Freq: Two times a day (BID) | ORAL | 0 refills | Status: DC

## 2023-03-17 MED ORDER — AMPHETAMINE-DEXTROAMPHETAMINE 30 MG PO TABS: 1.0000 | ORAL_TABLET | Freq: Two times a day (BID) | ORAL | 0 refills | Status: DC

## 2023-03-17 MED ORDER — CLONAZEPAM 0.5 MG PO TABS: 0.5000 mg | ORAL_TABLET | Freq: Every day | ORAL | 2 refills | Status: DC

## 2023-03-17 NOTE — Progress Notes (Signed)
Abigail Wright [id number] 1965/04/18 58 y.o.  Virtual Visit via Video Note  I connected with pt @ on 03/17/23 at 11:00 AM EST by a video enabled telemedicine application and verified that I am speaking with the correct person using two identifiers.   I discussed the limitations of evaluation and management by telemedicine and the availability of in person appointments. The patient expressed understanding and agreed to proceed.  I discussed the assessment and treatment plan with the patient. The patient was provided an opportunity to ask questions and all were answered. The patient agreed with the plan and demonstrated an understanding of the instructions.   The patient was advised to call back or seek an in-person evaluation if the symptoms worsen or if the condition fails to improve as anticipated.  I provided 15 minutes of non-face-to-face time during this encounter.  The patient was located at home.  The provider was located at [facility].   Dorothyann Gibbs, NP   Subjective:   Patient ID:  Abigail Wright is a 58 y.o. (DOB Aug 26, 1965) female.  Chief Complaint: No chief complaint on file.   HPI Abigail Wright presents for follow-up of anxiety, ADHD and depression.  Describes mood today as "ok". Pleasant. Denies tearfulness. Mood symptoms - denies depression, anxiety and irritability. Reports stable interest and motivation. Denies panic attacks. Denies worry, rumination and over thinking. Mood is consistent. Stating "I feel like I'm doing good". Feels like medications work well. Stable interest and motivation. Taking medications as prescribed.  Energy levels stable. Active, does not have a regular exercise routine.  Enjoys some usual interests and activities. Married. Lives with husband. Spending time with family - 2 daughters - 2 grand daughters 3 and 2. Appetite adequate. Weight stable.  Sleeps well most nights. Averages 7 to 8  hours.  Focus and concentration stable.  Completing tasks. 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   Denies SI or HI.  Denies AH or VH. Denies self harm.  Denies substance use.   Review of Systems:  Review of Systems  Musculoskeletal:  Negative for gait problem.  Neurological:  Negative for tremors.  Psychiatric/Behavioral:         Please refer to HPI    Medications: I have reviewed the patient's current medications.  Current Outpatient Medications  Medication Sig Dispense Refill   [START ON 05/12/2023] amphetamine-dextroamphetamine (ADDERALL) 30 MG tablet Take 1 tablet by mouth 2 (two) times daily. 60 tablet 0   amphetamine-dextroamphetamine (ADDERALL) 30 MG tablet Take 1 tablet by mouth 2 (two) times daily. 60 tablet 0   [START ON 04/14/2023] amphetamine-dextroamphetamine (ADDERALL) 30 MG tablet Take 1 tablet by mouth 2 (two) times daily. 60 tablet 0   aspirin EC 81 MG tablet Take 1 tablet (81 mg total) by mouth daily. Swallow whole.     atorvastatin (LIPITOR) 40 MG tablet Take 1 tablet (40 mg total) by mouth daily. 90 tablet 3   buPROPion (WELLBUTRIN XL) 300 MG 24 hr tablet Take 1 tablet (300 mg total) by mouth daily. 90 tablet 3   citalopram (CELEXA) 20 MG tablet TAKE 1 TABLET BY MOUTH EACH DAY 90 tablet 3   clonazePAM (KLONOPIN) 0.5 MG tablet Take 1 tablet (0.5 mg total) by mouth at bedtime. 90 tablet 2   fluticasone (FLONASE) 50 MCG/ACT nasal spray Place 1 spray into both nostrils daily.     meloxicam (MOBIC) 15 MG tablet TAKE 1 TABLET BY MOUTH EVERY DAY 30 tablet 0   Multiple  Vitamins-Minerals (MULTIVITAMIN ADULT EXTRA C PO) Take 1 tablet by mouth daily.     No current facility-administered medications for this visit.    Medication Side Effects: None  Allergies: No Known Allergies  Past Medical History:  Diagnosis Date   ADD (attention deficit disorder)    Anxiety    Arthritis    Blood transfusion without reported diagnosis    1993   Depression    GERD (gastroesophageal reflux  disease)    in past but recently controlled with diet and exercise.   Hyperlipidemia     Family History  Problem Relation Age of Onset   Cancer Mother    Diabetes Father    Heart disease Paternal Uncle    Hypertension Paternal Uncle    Colon cancer Neg Hx    Esophageal cancer Neg Hx    Rectal cancer Neg Hx    Stomach cancer Neg Hx     Social History   Socioeconomic History   Marital status: Married    Spouse name: Not on file   Number of children: Not on file   Years of education: Not on file   Highest education level: Some college, no degree  Occupational History   Not on file  Tobacco Use   Smoking status: Former   Smokeless tobacco: Never  Vaping Use   Vaping status: Never Used  Substance and Sexual Activity   Alcohol use: Never   Drug use: No   Sexual activity: Yes    Partners: Male    Birth control/protection: None, Post-menopausal  Other Topics Concern   Not on file  Social History Narrative   Not on file   Social Drivers of Health   Financial Resource Strain: Low Risk  (01/10/2023)   Overall Financial Resource Strain (CARDIA)    Difficulty of Paying Living Expenses: Not hard at all  Food Insecurity: No Food Insecurity (01/10/2023)   Hunger Vital Sign    Worried About Running Out of Food in the Last Year: Never true    Ran Out of Food in the Last Year: Never true  Transportation Needs: No Transportation Needs (01/10/2023)   PRAPARE - Administrator, Civil Service (Medical): No    Lack of Transportation (Non-Medical): No  Physical Activity: Unknown (01/10/2023)   Exercise Vital Sign    Days of Exercise per Week: 0 days    Minutes of Exercise per Session: Not on file  Stress: No Stress Concern Present (01/10/2023)   [employer] of Occupational Health - Occupational Stress Questionnaire    Feeling of Stress : Not at all  Social Connections: Moderately Integrated (01/10/2023)   Social Connection and Isolation Panel [NHANES]     Frequency of Communication with Friends and Family: More than three times a week    Frequency of Social Gatherings with Friends and Family: More than three times a week    Attends Religious Services: More than 4 times per year    Active Member of [employer] or Organizations: No    Attends Banker Meetings: Not on file    Marital Status: Married  Catering manager Violence: Not At Risk (03/28/2022)   Received from [employer], [facility]   HITS    Over the last 12 months how often did your partner physically hurt you?: Never    Over the last 12 months how often did your partner insult you or talk down to you?: Never    Over the last 12 months how often did  your partner threaten you with physical harm?: Never    Over the last 12 months how often did your partner scream or curse at you?: Never    Past Medical History, Surgical history, Social history, and Family history were reviewed and updated as appropriate.   Please see review of systems for further details on the patient's review from today.   Objective:   Physical Exam:  There were no vitals taken for this visit.  Physical Exam Constitutional:      General: She is not in acute distress. Musculoskeletal:        General: No deformity.  Neurological:     Mental Status: She is alert and oriented to person, place, and time.     Coordination: Coordination normal.  Psychiatric:        Attention and Perception: Attention and perception normal. She does not perceive auditory or visual hallucinations.        Mood and Affect: Mood normal. Mood is not anxious or depressed. Affect is not labile, blunt, angry or inappropriate.        Speech: Speech normal.        Behavior: Behavior normal.        Thought Content: Thought content normal. Thought content is not paranoid or delusional. Thought content does not include homicidal or suicidal ideation. Thought content does not include homicidal or suicidal plan.        Cognition and  Memory: Cognition and memory normal.        Judgment: Judgment normal.     Comments: Insight intact     Lab Review:     Component Value Date/Time   NA 139 02/22/2023 1055   K 4.6 02/22/2023 1055   CL 100 02/22/2023 1055   CO2 30 02/22/2023 1055   GLUCOSE 122 (H) 02/22/2023 1055   BUN 20 02/22/2023 1055   CREATININE 0.78 02/22/2023 1055   CALCIUM 9.8 02/22/2023 1055   PROT 7.1 02/22/2023 0959   ALBUMIN 4.7 02/22/2023 0959   AST 18 02/22/2023 0959   ALT 20 02/22/2023 0959   ALKPHOS 82 02/22/2023 0959   BILITOT 0.7 02/22/2023 0959       Component Value Date/Time   WBC 12.6 (H) 02/22/2023 1055   RBC 5.17 (H) 02/22/2023 1055   HGB 14.4 02/22/2023 1055   HCT 44.4 02/22/2023 1055   PLT 269.0 02/22/2023 1055   MCV 85.8 02/22/2023 1055   MCH 28.5 06/30/2021 1455   MCHC 32.5 02/22/2023 1055   RDW 14.6 02/22/2023 1055   LYMPHSABS 1.7 02/22/2023 1055   MONOABS 1.1 (H) 02/22/2023 1055   EOSABS 0.1 02/22/2023 1055   BASOSABS 0.0 02/22/2023 1055    No results found for: "POCLITH", "LITHIUM"   No results found for: "PHENYTOIN", "PHENOBARB", "VALPROATE", "CBMZ"   .res Assessment: Plan:    Plan:  1. Adderall 30mg  BID 2. Celexa 20 mg daily 3. Wellbutrin XL 300mg  4. Clonazepam 0.5mg  daily   Continue to monitor BP - WNL  RTC 3 months  15 minutes spent dedicated to the care of this patient on the date of this encounter to include pre-visit review of records, ordering of medication, post visit documentation, and face-to-face time with the patient discussing depression, anxiety, insomnia and obsessional thoughts. Discussed starting Zoloft to help manage current symptoms.anxiety, ADHD and depression.  Patient advised to contact office with any questions, adverse effects, or acute worsening in signs and symptoms.  Discussed potential benefits, risk, and side effects of benzodiazepines to include potential risk  of tolerance and dependence, as well as possible drowsiness. Advised  patient not to drive if experiencing drowsiness and to take lowest possible effective dose to minimize risk of dependence and tolerance.  Discussed potential benefits, risks, and side effects of stimulants with patient to include increased heart rate, palpitations, insomnia, increased anxiety, increased irritability, or decreased appetite.  Instructed patient to contact office if experiencing any significant tolerability issues.  Diagnoses and all orders for this visit:  Major depressive disorder, recurrent episode, moderate (HCC) -     amphetamine-dextroamphetamine (ADDERALL) 30 MG tablet; Take 1 tablet by mouth 2 (two) times daily. -     amphetamine-dextroamphetamine (ADDERALL) 30 MG tablet; Take 1 tablet by mouth 2 (two) times daily. -     amphetamine-dextroamphetamine (ADDERALL) 30 MG tablet; Take 1 tablet by mouth 2 (two) times daily.  Generalized anxiety disorder -     clonazePAM (KLONOPIN) 0.5 MG tablet; Take 1 tablet (0.5 mg total) by mouth at bedtime.  Attention deficit hyperactivity disorder (ADHD), unspecified ADHD type     Please see After Visit Summary for patient specific instructions.  Future Appointments  Date Time Provider Department Center  04/06/2023 10:00 AM Penumalli, Glenford Bayley, MD [facility] None  04/15/2023  9:00 AM Sharlene Dory, DO [facility]  04/21/2023  2:30 PM Kathryne Hitch, MD [facility] None    No orders of the defined types were placed in this encounter.     -------------------------------

## 2023-03-18 ENCOUNTER — Emergency Department (HOSPITAL_BASED_OUTPATIENT_CLINIC_OR_DEPARTMENT_OTHER)
Admission: EM | Admit: 2023-03-18 | Discharge: 2023-03-18 | Disposition: A | Discharge status: Home / Self Care | Payer: PRIVATE HEALTH INSURANCE | Attending: Emergency Medicine | Admitting: Emergency Medicine

## 2023-03-18 ENCOUNTER — Encounter: Payer: Self-pay | Admitting: Family Medicine

## 2023-03-18 ENCOUNTER — Encounter (HOSPITAL_BASED_OUTPATIENT_CLINIC_OR_DEPARTMENT_OTHER): Payer: Self-pay

## 2023-03-18 ENCOUNTER — Emergency Department (HOSPITAL_BASED_OUTPATIENT_CLINIC_OR_DEPARTMENT_OTHER): Payer: PRIVATE HEALTH INSURANCE

## 2023-03-18 ENCOUNTER — Other Ambulatory Visit: Payer: Self-pay

## 2023-03-18 ENCOUNTER — Ambulatory Visit: Payer: Self-pay | Admitting: Family Medicine

## 2023-03-18 DIAGNOSIS — Z79899 Other long term (current) drug therapy: Secondary | ICD-10-CM | POA: Insufficient documentation

## 2023-03-18 DIAGNOSIS — J189 Pneumonia, unspecified organism: Secondary | ICD-10-CM | POA: Insufficient documentation

## 2023-03-18 DIAGNOSIS — Z7982 Long term (current) use of aspirin: Secondary | ICD-10-CM | POA: Insufficient documentation

## 2023-03-18 DIAGNOSIS — Z20822 Contact with and (suspected) exposure to covid-19: Secondary | ICD-10-CM | POA: Diagnosis not present

## 2023-03-18 DIAGNOSIS — R059 Cough, unspecified: Secondary | ICD-10-CM | POA: Diagnosis present

## 2023-03-18 LAB — CBC
HCT: 47 % — ABNORMAL HIGH (ref 36.0–46.0)
Hemoglobin: 14.8 g/dL (ref 12.0–15.0)
MCH: 27.3 pg (ref 26.0–34.0)
MCHC: 31.5 g/dL (ref 30.0–36.0)
MCV: 86.7 fL (ref 80.0–100.0)
Platelets: 274 10*3/uL (ref 150–400)
RBC: 5.42 MIL/uL — ABNORMAL HIGH (ref 3.87–5.11)
RDW: 13.9 % (ref 11.5–15.5)
WBC: 6.8 10*3/uL (ref 4.0–10.5)
nRBC: 0 % (ref 0.0–0.2)

## 2023-03-18 LAB — BASIC METABOLIC PANEL
Anion gap: 8 (ref 5–15)
BUN: 17 mg/dL (ref 6–20)
CO2: 29 mmol/L (ref 22–32)
Calcium: 9.6 mg/dL (ref 8.9–10.3)
Chloride: 100 mmol/L (ref 98–111)
Creatinine, Ser: 0.78 mg/dL (ref 0.44–1.00)
GFR, Estimated: >60 mL/min (ref >60)
Glucose, Bld: 95 mg/dL (ref 70–99)
Potassium: 3.9 mmol/L (ref 3.5–5.1)
Sodium: 137 mmol/L (ref 135–145)

## 2023-03-18 LAB — TROPONIN I (HIGH SENSITIVITY): Troponin I (High Sensitivity): 3 ng/L (ref <18)

## 2023-03-18 LAB — RESP PANEL BY RT-PCR (RSV, FLU A&B, COVID)  RVPGX2
Influenza A by PCR: NEGATIVE
Influenza B by PCR: NEGATIVE
Resp Syncytial Virus by PCR: NEGATIVE
SARS Coronavirus 2 by RT PCR: NEGATIVE

## 2023-03-18 LAB — BRAIN NATRIURETIC PEPTIDE: B Natriuretic Peptide: 17.4 pg/mL (ref 0.0–100.0)

## 2023-03-18 MED ORDER — PREDNISONE 20 MG PO TABS
40.0000 mg | ORAL_TABLET | Freq: Every day | ORAL | 0 refills | Status: DC
Start: 2023-03-18 — End: 2023-04-06

## 2023-03-18 MED ORDER — AZITHROMYCIN 250 MG PO TABS: ORAL_TABLET | ORAL | 0 refills | Status: AC

## 2023-03-18 NOTE — ED Notes (Signed)
Ambulated PT per MD order while on room air. Highest Sp02 96% with HR 87. Lowest Sp02 94% with HR 111. PT was able to continue to speak in complete sentences during ambulation. PT states post ambulation that she is slightly short of breath. PT now resting comfortably in bed on RA with Sp02 97%, HR 81.

## 2023-03-18 NOTE — Telephone Encounter (Signed)
Copied from CRM [id number]. Topic: Clinical - Red Word Triage >> Mar 18, 2023  9:11 AM Gaetano Hawthorne wrote: [facility] that prompted transfer to Nurse Triage: Patient experiencing coughing, shortness of breath  Chief Complaint: SOB Symptoms:  O2@88 -89 yesterday,  94 - 97 - 95 @0910  sitting,  93 with movement @ 0921, difficulty completing sentences, chest pressure, SOB, hard to talk, hoarse, heavy in chest, tired,  Frequency: yesterday Pertinent Negatives: Patient denies n/v Disposition: [x] ED /[] Urgent Care (no appt availability in office) / [] Appointment(In office/virtual)/ []  [place] [facility]/ [] Home Care/ [] Refused Recommended Disposition /[] [place] [facility]/ []  Follow-up with PCP Additional Notes: lower than 90 two or three times today. O2 drops with slight movement.  Pt states hard to take deep breath, difficulty completing sentences.  Reason for Disposition  [1] MODERATE difficulty breathing (e.g., speaks in phrases, SOB even at rest, pulse 100-120) AND [2] NEW-onset or WORSE than normal  Answer Assessment - Initial Assessment Questions 1. RESPIRATORY STATUS: "Describe your breathing?" (e.g., wheezing, shortness of breath, unable to speak, severe coughing)      SOB, hard to talk, hoarse, heavy in chest, tired, strain to talk,  2. ONSET: "When did this breathing problem begin?"      X3 days and getting worse 3. PATTERN "Does the difficult breathing come and go, or has it been constant since it started?"      constant 4. SEVERITY: "How bad is your breathing?" (e.g., mild, moderate, severe)    - MILD: No SOB at rest, mild SOB with walking, speaks normally in sentences, can lie down, no retractions, pulse < 100.    - MODERATE: SOB at rest, SOB with minimal exertion and prefers to sit, cannot lie down flat, speaks in phrases, mild retractions, audible wheezing, pulse 100-120.    - SEVERE: Very SOB at rest, speaks in single words, struggling to breathe, sitting hunched forward, retractions,  pulse > 120      moderate 5. RECURRENT SYMPTOM: "Have you had difficulty breathing before?" If Yes, ask: "When was the last time?" and "What happened that time?"      no 6. CARDIAC HISTORY: "Do you have any history of heart disease?" (e.g., heart attack, angina, bypass surgery, angioplasty)      no 7. LUNG HISTORY: "Do you have any history of lung disease?"  (e.g., pulmonary embolus, asthma, emphysema)     No - COVID side effects 8. CAUSE: "What do you think is causing the breathing problem?"      unknown 9. OTHER SYMPTOMS: "Do you have any other symptoms? (e.g., dizziness, runny nose, cough, chest pain, fever)     SOB, cough, drainage in throat, nasal congestion 10. O2 SATURATION MONITOR:  "Do you use an oxygen saturation monitor (pulse oximeter) at home?" If Yes, ask: "What is your reading (oxygen level) today?" "What is your usual oxygen saturation reading?" (e.g., 95%)       Now 95 11. PREGNANCY: "Is there any chance you are pregnant?" "When was your last menstrual period?"       N/a 12. TRAVEL: "Have you traveled out of the country in the last month?" (e.g., travel history, exposures)       N/a  Protocols used: Breathing Difficulty-A-AH

## 2023-03-18 NOTE — ED Provider Notes (Signed)
[facility] Provider Note   CSN: [id number] Arrival date & time: 03/18/23  1025     History  Chief Complaint  Patient presents with   Cough    Abigail Wright is a 58 y.o. female with medical history of vertigo, MDD, GERD.  Patient presents to ED for evaluation of shortness of breath, cough.  Patient reports that on Tuesday she developed a cough associated with shortness of breath only when exerting herself.  States that her symptoms have persisted since onset.  Reports that she was at [facility] the week prior then returned home and began to feel ill.  She denies sore throat, body aches or chills, nausea, vomiting, abdominal pain.  Denies any chest pain.  Husband at the bedside states he feels as if she has pneumonia.  Patient vapes, used to smoke cigarettes.  Denies any known sick contacts.  Denies fevers.   Cough Associated symptoms: shortness of breath   Associated symptoms: no chest pain, no chills, no fever, no myalgias and no sore throat        Home Medications Prior to Admission medications   Medication Sig Start Date End Date Taking? Authorizing Provider  azithromycin (ZITHROMAX Z-PAK) 250 MG tablet Take 2 tablets (500 mg total) by mouth daily for 1 day, THEN 1 tablet (250 mg total) daily for 4 days. 03/18/23 03/23/23 Yes Al Decant, PA-C  predniSONE (DELTASONE) 20 MG tablet Take 2 tablets (40 mg total) by mouth daily. 03/18/23  Yes Al Decant, PA-C  amphetamine-dextroamphetamine (ADDERALL) 30 MG tablet Take 1 tablet by mouth 2 (two) times daily. 03/17/23   Mozingo, Thereasa Solo, NP  amphetamine-dextroamphetamine (ADDERALL) 30 MG tablet Take 1 tablet by mouth 2 (two) times daily. 04/14/23   Mozingo, Thereasa Solo, NP  amphetamine-dextroamphetamine (ADDERALL) 30 MG tablet Take 1 tablet by mouth 2 (two) times daily. 05/12/23   Mozingo, Thereasa Solo, NP  aspirin EC 81 MG tablet Take 1 tablet (81 mg total) by  mouth daily. Swallow whole. 01/11/23   Sharlene Dory, DO  atorvastatin (LIPITOR) 40 MG tablet Take 1 tablet (40 mg total) by mouth daily. 01/11/23   Sharlene Dory, DO  buPROPion (WELLBUTRIN XL) 300 MG 24 hr tablet Take 1 tablet (300 mg total) by mouth daily. 09/14/22   Mozingo, Thereasa Solo, NP  citalopram (CELEXA) 20 MG tablet TAKE 1 TABLET BY MOUTH EACH DAY 09/14/22   Mozingo, Thereasa Solo, NP  clonazePAM (KLONOPIN) 0.5 MG tablet Take 1 tablet (0.5 mg total) by mouth at bedtime. 03/17/23   Mozingo, Thereasa Solo, NP  fluticasone (FLONASE) 50 MCG/ACT nasal spray Place 1 spray into both nostrils daily.    [provider]  meloxicam (MOBIC) 15 MG tablet TAKE 1 TABLET BY MOUTH EVERY DAY 01/17/23   Persons, [place], PA  Multiple Vitamins-Minerals (MULTIVITAMIN ADULT EXTRA C PO) Take 1 tablet by mouth daily.    [provider]      Allergies    Patient has no known allergies.    Review of Systems   Review of Systems  Constitutional:  Negative for chills and fever.  HENT:  Negative for sore throat.   Respiratory:  Positive for cough, chest tightness and shortness of breath.   Cardiovascular:  Negative for chest pain and leg swelling.  Gastrointestinal:  Negative for abdominal pain, nausea and vomiting.  Musculoskeletal:  Negative for myalgias.  All other systems reviewed and are negative.   Physical Exam Updated  Vital Signs BP 111/72 (BP Location: Left Arm)   Pulse 75   Temp 97.9 F (36.6 C) (Oral)   Resp 18   Wt 113.4 kg   SpO2 94%   BMI 36.92 kg/m  Physical Exam Vitals and nursing note reviewed.  Constitutional:      General: She is not in acute distress.    Appearance: Normal appearance. She is not ill-appearing, toxic-appearing or diaphoretic.  HENT:     Head: Normocephalic and atraumatic.     Nose: Nose normal.     Mouth/Throat:     Mouth: Mucous membranes are moist.     Pharynx: Oropharynx is clear. No oropharyngeal exudate or  posterior oropharyngeal erythema.     Comments: Uvula midline and handling secretions appropriately Eyes:     Extraocular Movements: Extraocular movements intact.     Conjunctiva/sclera: Conjunctivae normal.     Pupils: Pupils are equal, round, and reactive to light.  Cardiovascular:     Rate and Rhythm: Normal rate and regular rhythm.  Musculoskeletal:     Cervical back: Normal range of motion and neck supple.     Right lower leg: No edema.     Left lower leg: No edema.     Comments: No edema to bilateral lower extremities  Skin:    Capillary Refill: Capillary refill takes more than 3 seconds.  Neurological:     General: No focal deficit present.     Mental Status: She is alert and oriented to person, place, and time.     ED Results / Procedures / Treatments   Labs (all labs ordered are listed, but only abnormal results are displayed) Labs Reviewed  CBC - Abnormal; Notable for the following components:      Result Value   RBC 5.42 (*)    HCT 47.0 (*)    All other components within normal limits  RESP PANEL BY RT-PCR (RSV, FLU A&B, COVID)  [id number]  BASIC METABOLIC PANEL  BRAIN NATRIURETIC PEPTIDE  TROPONIN I (HIGH SENSITIVITY)    EKG EKG Interpretation Date/Time:  Friday March 18 2023 10:51:07 EST Ventricular Rate:  84 PR Interval:  182 QRS Duration:  75 QT Interval:  356 QTC Calculation: 421 R Axis:   20  Text Interpretation: Sinus rhythm Low voltage, precordial leads no significant change since May 2023 Confirmed by Pricilla Loveless [phone]) on 03/18/2023 2:03:28 PM  Radiology DG Chest 2 View Result Date: 03/18/2023 CLINICAL DATA:  Chest heaviness cough, shortness of breath EXAM: CHEST - 2 VIEW COMPARISON:  None Available. FINDINGS: The heart size and mediastinal contours are within normal limits. Mild diffuse interstitial opacity. The visualized skeletal structures are unremarkable. IMPRESSION: Mild diffuse interstitial opacity, consistent with edema or  atypical/viral infection. No focal airspace opacity. Electronically Signed   By: Jearld Lesch M.D.   On: 03/18/2023 11:46    Procedures Procedures   Medications Ordered in ED Medications - No data to display  ED Course/ Medical Decision Making/ A&P  Medical Decision Making Amount and/or Complexity of Data Reviewed Labs: ordered. Radiology: ordered.   58 year old female presents to ED for evaluation.  Please see HPI for further details.  On exam, the patient is afebrile and nontachycardic.  Her lung sounds are clear bilaterally, no wheezing, no rhonchi, no rales.  She is not hypoxic on room air.  She was ambulated and maintained oxygen saturation greater than 95%.  Abdomen soft and compressible with no tenderness.  No edema to bilateral lower extremities.  Neurological examinations at  baseline.  Posterior oropharynx without erythema, exudate.  Uvula is midline.  Will collect basic labs.  Will collect troponins, BNP, viral panel.  Will collect chest x-ray.  CBC without leukocytosis or anemia.  Metabolic panel without electrolyte derangement.  Viral panel negative for all.  Troponin 3.  BNP 17.4.  Chest x-ray shows a mild diffuse interstitial opacity consistent with edema or atypical/viral infection.  EKG sinus rhythm.  Patient ambulated and maintained oxygen saturation.  At this time, will treat patient with azithromycin, steroids.  Will have patient follow-up with her PCP.  Encouraged rest at home, hydration.   Final Clinical Impression(s) / ED Diagnoses Final diagnoses:  Pneumonia due to infectious organism, unspecified laterality, unspecified part of lung    Rx / DC Orders ED Discharge Orders          Ordered    azithromycin (ZITHROMAX Z-PAK) 250 MG tablet  Daily        03/18/23 1507    predniSONE (DELTASONE) 20 MG tablet  Daily        03/18/23 1507              Al Decant, [place] 03/18/23 1522    Pricilla Loveless, MD 03/19/23 [phone]

## 2023-03-18 NOTE — ED Triage Notes (Signed)
Pt complains of being hoarse on Tuesday. Coughing. SOB with exertion. Pt has been checking pulse ox at home and reports 88-90% when ambulating

## 2023-03-18 NOTE — Discharge Instructions (Addendum)
It was a pleasure taking part in your care.  As we discussed, it appears that you might have pneumonia based off of your chest x-ray.  I am starting you on an azithromycin pack.  Please take 5 mg on day 1, followed by 250 mg on days 2 through 5.  Please begin taking steroids, prednisone, 40 mg once a day for the next 3 days.  Please follow-up with your PCP.  Please return to the ED with any new or worsening signs or symptoms.

## 2023-03-23 NOTE — Progress Notes (Signed)
 COVID Vaccine Completed:  Date of COVID positive in last 90 days:  PCP - Mabel Pry, DO Cardiologist -   Chest x-ray - 03/18/23 Epic EKG - 03/18/23 Epic Stress Test -  ECHO -  Cardiac Cath -  Pacemaker/ICD device last checked: Spinal Cord Stimulator:  Bowel Prep -   Sleep Study -  CPAP -   Fasting Blood Sugar -  Checks Blood Sugar _____ times a day  Last dose of GLP1 agonist-  N/A GLP1 instructions:  Hold 7 days before surgery    Last dose of SGLT-2 inhibitors-  N/A SGLT-2 instructions:  Hold 3 days before surgery    Blood Thinner Instructions:  Time Aspirin  Instructions: ASA 81 Last Dose:  Activity level:  Can go up a flight of stairs and perform activities of daily living without stopping and without symptoms of chest pain or shortness of breath.  Able to exercise without symptoms  Unable to go up a flight of stairs without symptoms of     Anesthesia review:   Patient denies shortness of breath, fever, cough and chest pain at PAT appointment  Patient verbalized understanding of instructions that were given to them at the PAT appointment. Patient was also instructed that they will need to review over the PAT instructions again at home before surgery.

## 2023-03-23 NOTE — Progress Notes (Signed)
Please place orders for PAT appointment scheduled 03/29/23.

## 2023-03-23 NOTE — Patient Instructions (Addendum)
SURGICAL WAITING ROOM VISITATION  Patients having surgery or a procedure may have no more than 2 support people in the waiting area - these visitors may rotate.    Children under the age of 61 must have an adult with them who is not the patient.  Due to an increase in RSV and influenza rates and associated hospitalizations, children ages 31 and under may not visit patients in [facility] hospitals.  Visitors with respiratory illnesses are discouraged from visiting and should remain at home.  If the patient needs to stay at the hospital during part of their recovery, the visitor guidelines for inpatient rooms apply. Pre-op nurse will coordinate an appropriate time for 1 support person to accompany patient in pre-op.  This support person may not rotate.    Please refer to the [facility] website for the visitor guidelines for Inpatients (after your surgery is over and you are in a regular room).    Your procedure is scheduled on: 04/08/23   Report to [facility] Main Entrance    Report to admitting at 11:15 AM   Call this number if you have problems the morning of surgery [phone]   Do not eat food :After Midnight.   After Midnight you may have the following liquids until 10:45 AM DAY OF SURGERY  Water Non-Citrus Juices (without pulp, NO RED-Apple, White grape, White cranberry) Black Coffee (NO MILK/CREAM OR CREAMERS, sugar ok)  Clear Tea (NO MILK/CREAM OR CREAMERS, sugar ok) regular and decaf                             Plain Jell-O (NO RED)                                           Fruit ices (not with fruit pulp, NO RED)                                     Popsicles (NO RED)                                                               Sports drinks like Gatorade (NO RED)                 The day of surgery:  Drink ONE (1) Pre-Surgery Clear Ensure at 10:45 AM the morning of surgery. Drink in one sitting. Do not sip.  This drink was given to you during your hospital   pre-op appointment visit. Nothing else to drink after completing the  Pre-Surgery Clear Ensure.      Oral Hygiene is also important to reduce your risk of infection.                                    Remember - BRUSH YOUR TEETH THE MORNING OF SURGERY WITH YOUR REGULAR TOOTHPASTE   Stop all vitamins and herbal supplements 7 days before surgery.   Take these medicines the morning of surgery with A  SIP OF WATER: Atorvastatin, Bupropion, Omeprazole   DO NOT TAKE ANY ORAL DIABETIC MEDICATIONS DAY OF YOUR SURGERY  Bring CPAP mask and tubing day of surgery.                              You may not have any metal on your body including hair pins, jewelry, and body piercing             Do not wear make-up, lotions, powders, perfumes, or deodorant  Do not wear nail polish including gel and S&S, artificial/acrylic nails, or any other type of covering on natural nails including finger and toenails. If you have artificial nails, gel coating, etc. that needs to be removed by a nail salon please have this removed prior to surgery or surgery may need to be canceled/ delayed if the surgeon/ anesthesia feels like they are unable to be safely monitored.   Do not shave  48 hours prior to surgery.    Do not bring valuables to the hospital. [facility] IS NOT             RESPONSIBLE   FOR VALUABLES.   Contacts, glasses, dentures or bridgework may not be worn into surgery.   Bring small overnight bag day of surgery.   DO NOT BRING YOUR HOME MEDICATIONS TO THE HOSPITAL. PHARMACY WILL DISPENSE MEDICATIONS LISTED ON YOUR MEDICATION LIST TO YOU DURING YOUR ADMISSION IN THE HOSPITAL!   Special Instructions: Bring a copy of your healthcare power of attorney and living will documents the day of surgery if you haven't scanned them before.              Please read over the following fact sheets you were given: IF YOU HAVE QUESTIONS ABOUT YOUR PRE-OP INSTRUCTIONS PLEASE CALL [phone] [facility]   If you  received a COVID test during your pre-op visit  it is requested that you wear a mask when out in public, stay away from anyone that may not be feeling well and notify your surgeon if you develop symptoms. If you test positive for Covid or have been in contact with anyone that has tested positive in the last 10 days please notify you surgeon.      Pre-operative 5 CHG Bath Instructions   You can play a key role in reducing the risk of infection after surgery. Your skin needs to be as free of germs as possible. You can reduce the number of germs on your skin by washing with CHG (chlorhexidine gluconate) soap before surgery. CHG is an antiseptic soap that kills germs and continues to kill germs even after washing.   DO NOT use if you have an allergy to chlorhexidine/CHG or antibacterial soaps. If your skin becomes reddened or irritated, stop using the CHG and notify one of our RNs at [phone].   Please shower with the CHG soap starting 4 days before surgery using the following schedule:     Please keep in mind the following:  DO NOT shave, including legs and underarms, starting the day of your first shower.   You may shave your face at any point before/day of surgery.  Place clean sheets on your bed the day you start using CHG soap. Use a clean washcloth (not used since being washed) for each shower. DO NOT sleep with pets once you start using the CHG.   CHG Shower Instructions:  If you choose to wash your hair and  private area, wash first with your normal shampoo/soap.  After you use shampoo/soap, rinse your hair and body thoroughly to remove shampoo/soap residue.  Turn the water OFF and apply about 3 tablespoons (45 ml) of CHG soap to a CLEAN washcloth.  Apply CHG soap ONLY FROM YOUR NECK DOWN TO YOUR TOES (washing for 3-5 minutes)  DO NOT use CHG soap on face, private areas, open wounds, or sores.  Pay special attention to the area where your surgery is being performed.  If you are  having back surgery, having someone wash your back for you may be helpful. Wait 2 minutes after CHG soap is applied, then you may rinse off the CHG soap.  Pat dry with a clean towel  Put on clean clothes/pajamas   If you choose to wear lotion, please use ONLY the CHG-compatible lotions on the back of this paper.     Additional instructions for the day of surgery: DO NOT APPLY any lotions, deodorants, cologne, or perfumes.   Put on clean/comfortable clothes.  Brush your teeth.  Ask your nurse before applying any prescription medications to the skin.      CHG Compatible Lotions   Aveeno Moisturizing lotion  Cetaphil Moisturizing Cream  Cetaphil Moisturizing Lotion  Clairol Herbal Essence Moisturizing Lotion, Dry Skin  Clairol Herbal Essence Moisturizing Lotion, Extra Dry Skin  Clairol Herbal Essence Moisturizing Lotion, Normal Skin  Curel Age Defying Therapeutic Moisturizing Lotion with Alpha Hydroxy  Curel Extreme Care Body Lotion  Curel Soothing Hands Moisturizing Hand Lotion  Curel Therapeutic Moisturizing Cream, Fragrance-Free  Curel Therapeutic Moisturizing Lotion, Fragrance-Free  Curel Therapeutic Moisturizing Lotion, Original Formula  Eucerin Daily Replenishing Lotion  Eucerin Dry Skin Therapy Plus Alpha Hydroxy Crme  Eucerin Dry Skin Therapy Plus Alpha Hydroxy Lotion  Eucerin Original Crme  Eucerin Original Lotion  Eucerin Plus Crme Eucerin Plus Lotion  Eucerin TriLipid Replenishing Lotion  Keri Anti-Bacterial Hand Lotion  Keri Deep Conditioning Original Lotion Dry Skin Formula Softly Scented  Keri Deep Conditioning Original Lotion, Fragrance Free Sensitive Skin Formula  Keri Lotion Fast Absorbing Fragrance Free Sensitive Skin Formula  Keri Lotion Fast Absorbing Softly Scented Dry Skin Formula  Keri Original Lotion  Keri Skin Renewal Lotion Keri Silky Smooth Lotion  Keri Silky Smooth Sensitive Skin Lotion  Nivea Body Creamy Conditioning Oil  Nivea Body Extra  Enriched Teacher, adult education Moisturizing Lotion Nivea Crme  Nivea Skin Firming Lotion  NutraDerm 30 Skin Lotion  NutraDerm Skin Lotion  NutraDerm Therapeutic Skin Cream  NutraDerm Therapeutic Skin Lotion  ProShield Protective Hand Cream  Provon moisturizing lotion   View Pre-Surgery Education Videos:  [email or web address]

## 2023-03-29 ENCOUNTER — Other Ambulatory Visit: Payer: Self-pay

## 2023-03-29 ENCOUNTER — Encounter (HOSPITAL_COMMUNITY)
Admission: RE | Admit: 2023-03-29 | Discharge: 2023-03-29 | Disposition: A | Discharge status: Home / Self Care | Payer: PRIVATE HEALTH INSURANCE | Source: Ambulatory Visit | Attending: Orthopaedic Surgery | Admitting: Orthopaedic Surgery

## 2023-03-29 ENCOUNTER — Encounter (HOSPITAL_COMMUNITY): Payer: Self-pay

## 2023-03-29 VITALS — BP 148/95 | HR 94 | Temp 98.3°F | Resp 18 | Ht 68.0 in | Wt 250.0 lb

## 2023-03-29 DIAGNOSIS — M1712 Unilateral primary osteoarthritis, left knee: Secondary | ICD-10-CM | POA: Diagnosis not present

## 2023-03-29 DIAGNOSIS — Z01818 Encounter for other preprocedural examination: Secondary | ICD-10-CM | POA: Diagnosis present

## 2023-03-29 LAB — SURGICAL PCR SCREEN
MRSA, PCR: NEGATIVE
Staphylococcus aureus: NEGATIVE

## 2023-03-29 NOTE — Progress Notes (Addendum)
 COVID Vaccine Completed:never had one  Date of COVID positive in last 50 days:no  PCP - Mabel Pry, DO Cardiologist -   Chest x-ray - 03/18/23 Epic EKG - 03/18/23 Epic Stress Test -  ECHO -  Cardiac Cath -  Pacemaker/ICD device last checked:No Spinal Cord Stimulator:no  Bowel Prep -   Sleep Study - no CPAP - no  Fasting Blood Sugar - no Checks Blood Sugar ____0_ times a day  Last dose of GLP1 agonist-  N/A GLP1 instructions:  Hold 7 days before surgery    Last dose of SGLT-2 inhibitors-  N/A SGLT-2 instructions:  Hold 3 days before surgery    Blood Thinner Instructions:  Time Aspirin  Instructions: ASA 81- hold x 5-7 days prior to surgery Last Dose:  Activity level:  Can go up a flight of stairs and perform activities of daily living without stopping and without symptoms of chest pain or shortness of breath.  Able to exercise without symptoms  Anesthesia review: Pneumonia 03/17/23. Took antibx and steroid. No symptoms now  Patient denies shortness of breath, fever, cough and chest pain at PAT appointment  Patient verbalized understanding of instructions that were given to them at the PAT appointment. Patient was also instructed that they will need to review over the PAT instructions again at home before surgery.

## 2023-04-04 ENCOUNTER — Telehealth: Payer: Self-pay | Admitting: Family Medicine

## 2023-04-06 ENCOUNTER — Ambulatory Visit (INDEPENDENT_AMBULATORY_CARE_PROVIDER_SITE_OTHER): Payer: PRIVATE HEALTH INSURANCE | Admitting: Diagnostic Neuroimaging

## 2023-04-06 ENCOUNTER — Encounter: Payer: Self-pay | Admitting: Diagnostic Neuroimaging

## 2023-04-06 DIAGNOSIS — H9319 Tinnitus, unspecified ear: Secondary | ICD-10-CM | POA: Diagnosis not present

## 2023-04-06 DIAGNOSIS — H9190 Unspecified hearing loss, unspecified ear: Secondary | ICD-10-CM

## 2023-04-06 DIAGNOSIS — R42 Dizziness and giddiness: Secondary | ICD-10-CM

## 2023-04-06 NOTE — Progress Notes (Signed)
[facility]  PATIENT: Abigail Wright DOB: 1965-09-17  REFERRING CLINICIAN: Sharlene Dory* HISTORY FROM: patient  REASON FOR VISIT: new consult   HISTORICAL  CHIEF COMPLAINT:  Chief Complaint  Patient presents with   New Patient (Initial Visit)    Pt in 6 with husband Pt here for dizziness and discuss MRI results     HISTORY OF PRESENT ILLNESS:   58 year old female here for evaluation of vertigo spells.  Since 2018 patient has had a few intermittent episodes of severe vertigo's, sometimes associate with nausea.  1 year ago patient was on a international flight when all of a sudden she had severe nausea and vertigo.  She was treated on board with some Benadryl.  When she landed her symptoms had resolved.  Eventually she had MRI of the brain which showed mild proximal vessel ischemic disease and a small chronic right cerebellar ischemic infarction.  Patient never had any unilateral incoordination, slurred speech, stroke type symptoms in the past.    REVIEW OF SYSTEMS: Full 14 system review of systems performed and negative with exception of: as per HPI.  ALLERGIES: No Known Allergies  HOME MEDICATIONS: Outpatient Medications Prior to Visit  Medication Sig Dispense Refill   amphetamine-dextroamphetamine (ADDERALL) 30 MG tablet Take 1 tablet by mouth 2 (two) times daily. (Patient taking differently: Take 30 mg by mouth in the morning.) 60 tablet 0   aspirin EC 81 MG tablet Take 1 tablet (81 mg total) by mouth daily. Swallow whole.     atorvastatin (LIPITOR) 40 MG tablet Take 1 tablet (40 mg total) by mouth daily. 90 tablet 3   Biotin-Vitamin C (HAIR SKIN NAILS GUMMIES PO) Take 2 tablets by mouth in the morning.     buPROPion (WELLBUTRIN XL) 300 MG 24 hr tablet Take 1 tablet (300 mg total) by mouth daily. 90 tablet 3   carboxymethylcellulose (REFRESH TEARS) 0.5 % SOLN Place 1 drop into both eyes in the morning.     citalopram (CELEXA) 20 MG tablet  TAKE 1 TABLET BY MOUTH EACH DAY (Patient taking differently: Take 20 mg by mouth at bedtime.) 90 tablet 3   clonazePAM (KLONOPIN) 0.5 MG tablet Take 1 tablet (0.5 mg total) by mouth at bedtime. 90 tablet 2   diclofenac (VOLTAREN) 75 MG EC tablet Take 75 mg by mouth 2 (two) times daily.     fluticasone (FLONASE) 50 MCG/ACT nasal spray Place 1 spray into both nostrils in the morning.     Multiple Vitamin (MULTIVITAMIN WITH MINERALS) TABS tablet Take 1 tablet by mouth in the morning. One A Day for Women 50+     omeprazole (PRILOSEC OTC) 20 MG tablet Take 20 mg by mouth daily before breakfast.     Psyllium (METAMUCIL PO) Take 3 tablets by mouth in the morning. Metamucil Fiber Gummies Plus Probiotics for Digestive Health     [START ON 04/14/2023] amphetamine-dextroamphetamine (ADDERALL) 30 MG tablet Take 1 tablet by mouth 2 (two) times daily. (Patient not taking: Reported on 03/23/2023) 60 tablet 0   [START ON 05/12/2023] amphetamine-dextroamphetamine (ADDERALL) 30 MG tablet Take 1 tablet by mouth 2 (two) times daily. (Patient not taking: Reported on 03/23/2023) 60 tablet 0   predniSONE (DELTASONE) 20 MG tablet Take 2 tablets (40 mg total) by mouth daily. 6 tablet 0   No facility-administered medications prior to visit.    PAST MEDICAL HISTORY: Past Medical History:  Diagnosis Date   ADD (attention deficit disorder)    Anxiety    Arthritis  Blood transfusion without reported diagnosis    1993   Depression    GERD (gastroesophageal reflux disease)    in past but recently controlled with diet and exercise.   Hyperlipidemia     PAST SURGICAL HISTORY: Past Surgical History:  Procedure Laterality Date   ABDOMINAL HYSTERECTOMY  1993   BUNIONECTOMY Left    CARPAL TUNNEL RELEASE Bilateral    cyst removal from left hand Left    FOOT SURGERY     KNEE CARTILAGE SURGERY     planters fascitis     SHOULDER ARTHROSCOPY WITH SUBACROMIAL DECOMPRESSION AND OPEN ROTATOR C Right 07/07/2021   Procedure:  RIGHT SHOULDER ARTHROSCOPY WITH SUBACROMIAL DECOMPRESSION / ROTATOR CUFF REPAIR/ DISTAL CLAVICLE RESECTION /BICEPS TENODESIS;  Surgeon: Valeria Batman, MD;  Location: [facility];  Service: Orthopedics;  Laterality: Right;   TENDON REPAIR Left 03/21/2017   Left hand    TONSILLECTOMY      FAMILY HISTORY: Family History  Problem Relation Age of Onset   Cancer Mother    Diabetes Father    Heart disease Paternal Uncle    Hypertension Paternal Uncle    Colon cancer Neg Hx    Esophageal cancer Neg Hx    Rectal cancer Neg Hx    Stomach cancer Neg Hx    Stroke Neg Hx     SOCIAL HISTORY: Social History   Socioeconomic History   Marital status: Married    Spouse name: Not on file   Number of children: Not on file   Years of education: Not on file   Highest education level: Some college, no degree  Occupational History   Not on file  Tobacco Use   Smoking status: Former   Smokeless tobacco: Never  Vaping Use   Vaping status: Every Day  Substance and Sexual Activity   Alcohol use: Yes    Comment: occasionally   Drug use: No   Sexual activity: Yes    Partners: Male    Birth control/protection: None, Post-menopausal  Other Topics Concern   Not on file  Social History Narrative   Pt lives husband    Pt works    Social Drivers of Corporate investment banker Strain: Low Risk  (01/10/2023)   Overall Financial Resource Strain (CARDIA)    Difficulty of Paying Living Expenses: Not hard at all  Food Insecurity: No Food Insecurity (01/10/2023)   Hunger Vital Sign    Worried About Running Out of Food in the Last Year: Never true    Ran Out of Food in the Last Year: Never true  Transportation Needs: No Transportation Needs (01/10/2023)   PRAPARE - Administrator, Civil Service (Medical): No    Lack of Transportation (Non-Medical): No  Physical Activity: Unknown (01/10/2023)   Exercise Vital Sign    Days of Exercise per Week: 0 days    Minutes of Exercise per Session:  Not on file  Stress: No Stress Concern Present (01/10/2023)   [employer] of Occupational Health - Occupational Stress Questionnaire    Feeling of Stress : Not at all  Social Connections: Moderately Integrated (01/10/2023)   Social Connection and Isolation Panel [NHANES]    Frequency of Communication with Friends and Family: More than three times a week    Frequency of Social Gatherings with Friends and Family: More than three times a week    Attends Religious Services: More than 4 times per year    Active Member of Clubs or Organizations: No  Attends Banker Meetings: Not on file    Marital Status: Married  Intimate Partner Violence: Not At Risk (03/28/2022)   Received from [facility]   HITS    Over the last 12 months how often did your partner physically hurt you?: Never    Over the last 12 months how often did your partner insult you or talk down to you?: Never    Over the last 12 months how often did your partner threaten you with physical harm?: Never    Over the last 12 months how often did your partner scream or curse at you?: Never     PHYSICAL EXAM  GENERAL EXAM/CONSTITUTIONAL: Vitals: There were no vitals filed for this visit. There is no height or weight on file to calculate BMI. Wt Readings from Last 3 Encounters:  03/29/23 250 lb (113.4 kg)  03/18/23 250 lb (113.4 kg)  02/28/23 252 lb (114.3 kg)  Orthostatic VS for the past 24 hrs (Last 3 readings):  BP- Lying Pulse- Lying BP- Standing at 0 minutes Pulse- Standing at 0 minutes BP- Standing at 3 minutes Pulse- Standing at 3 minutes  04/06/23 1052 126/78 69 127/87 94 141/89 92    Patient is in no distress; well developed, nourished and groomed; neck is supple  CARDIOVASCULAR: Examination of carotid arteries is normal; no carotid bruits Regular rate and rhythm, no murmurs Examination of peripheral vascular system by observation and palpation is normal  EYES: Ophthalmoscopic  exam of optic discs and posterior segments is normal; no papilledema or hemorrhages No results found.  MUSCULOSKELETAL: Gait, strength, tone, movements noted in Neurologic exam below  NEUROLOGIC: MENTAL STATUS:      No data to display         awake, alert, oriented to person, place and time recent and remote memory intact normal attention and concentration language fluent, comprehension intact, naming intact fund of knowledge appropriate  CRANIAL NERVE:  2nd - no papilledema on fundoscopic exam 2nd, 3rd, 4th, 6th - pupils equal and reactive to light, visual fields full to confrontation, extraocular muscles intact, no nystagmus 5th - facial sensation symmetric 7th - facial strength symmetric 8th - hearing intact 9th - palate elevates symmetrically, uvula midline 11th - shoulder shrug symmetric 12th - tongue protrusion midline  MOTOR:  normal bulk and tone, full strength in the BUE, BLE  SENSORY:  normal and symmetric to light touch, temperature, vibration  COORDINATION:  finger-nose-finger, fine finger movements normal  REFLEXES:  deep tendon reflexes 1+ and symmetric  GAIT/STATION:  narrow based gait     DIAGNOSTIC DATA (LABS, IMAGING, TESTING) - I reviewed patient records, labs, notes, testing and imaging myself where available.  Lab Results  Component Value Date   WBC 6.8 03/18/2023   HGB 14.8 03/18/2023   HCT 47.0 (H) 03/18/2023   MCV 86.7 03/18/2023   PLT 274 03/18/2023      Component Value Date/Time   NA 137 03/18/2023 1330   K 3.9 03/18/2023 1330   CL 100 03/18/2023 1330   CO2 29 03/18/2023 1330   GLUCOSE 95 03/18/2023 1330   BUN 17 03/18/2023 1330   CREATININE 0.78 03/18/2023 1330   CALCIUM 9.6 03/18/2023 1330   PROT 7.1 02/22/2023 0959   ALBUMIN 4.7 02/22/2023 0959   AST 18 02/22/2023 0959   ALT 20 02/22/2023 0959   ALKPHOS 82 02/22/2023 0959   BILITOT 0.7 02/22/2023 0959   GFRNONAA >60 03/18/2023 1330   Lab Results  Component  Value Date   CHOL 171 02/22/2023   HDL 75.20 02/22/2023   LDLCALC 80 02/22/2023   TRIG 78.0 02/22/2023   CHOLHDL 2 02/22/2023   No results found for: "HGBA1C" No results found for: "VITAMINB12" No results found for: "TSH"  12/10/22 MRI brain (with and without) [report only] 1. No acute intracranial abnormality.  2. Negative internal auditory canal imaging. No retrocochlear  lesion.  3. Mild chronic small vessel ischemic disease.  4. Small chronic right cerebellar infarct.    ASSESSMENT AND PLAN  58 y.o. year old female here with:  Dx:  1. Vertigo   2. Hearing loss, unspecified hearing loss type, unspecified laterality   3. Tinnitus, unspecified laterality    PLAN:  INTERMITTENT VERTIGO, HEARING LOSS, TINNITUS (possible meniere's disease; remote history of migraine; no current migraine symptoms) - may follow up with ENT - limit salt, caffeine, alcohol  MRI findings - mild chronic small vessel ischemic disease; right cerebellar infarct likely incidental finding - continue medical mgmt of vascular risk factors (continue atorvastatin 40mg ; ok to stop aspirin as not clear that patient had a stroke event)  Return for pending if symptoms worsen or fail to improve, return to PCP.    Suanne Marker, MD 04/06/2023, 2:44 PM Certified in Neurology, Neurophysiology and Neuroimaging  [facility] [address], [address] [place], [place] [address] [phone]

## 2023-04-06 NOTE — Patient Instructions (Signed)
  INTERMITTENT VERTIGO, HEARING LOSS, TINNITUS (possible meniere's disease; remote history of migraine; no current migraine symptoms) - may follow up with ENT - limit salt, caffeine, alcohol  MRI findings - mild chronic small vessel ischemic disease; right cerebellar infarct likely incidental finding - continue medical mgmt of vascular risk factors (continue atorvastatin 40mg ; ok to stop aspirin as not clear that patient had a stroke event)

## 2023-04-08 DIAGNOSIS — M1712 Unilateral primary osteoarthritis, left knee: Secondary | ICD-10-CM

## 2023-04-15 ENCOUNTER — Encounter: Payer: No Typology Code available for payment source | Admitting: Family Medicine

## 2023-04-21 ENCOUNTER — Encounter: Payer: No Typology Code available for payment source | Admitting: Orthopaedic Surgery

## 2023-04-30 NOTE — Progress Notes (Signed)
 COVID Vaccine received:  [x]  No []  Yes Date of any COVID positive Test in last 90 days:  none  PCP - Arva Chafe, DO [phone] (Work)  [phone] (Fax)  Cardiologist - none Neurology- Joycelyn Schmid, MD   Chest x-ray - 03-18-2023  2v  Epic EKG - 03-18-2023  Epic  Stress Test -  ECHO -  Cardiac Cath -   PCR screen: [x]  Ordered & Completed []   No Order but Needs PROFEND     []   N/A for this surgery  Surgery Plan:  []  Ambulatory   [x]  Outpatient in bed  []  Admit Anesthesia:    []  General  [x]  Spinal  []   Choice []   MAC  Pacemaker / ICD device [x]  No []  Yes   Spinal Cord Stimulator:[x]  No []  Yes       History of Sleep Apnea? [x]  No []  Yes   CPAP used?- [x]  No []  Yes    Does the patient monitor blood sugar?   [x]  N/A   []  No []  Yes  Patient has: [x]  NO Hx DM   []  Pre-DM   []  DM1  []   DM2  Blood Thinner / Instructions:   none Aspirin Instructions:  ASA 81mg    Hold x 7day  ERAS Protocol Ordered: []  No  [x]  Yes PRE-SURGERY [x]  ENSURE  []  G2   Patient is to be NPO after: 0700  Dental hx: []  Dentures:  [x]  N/A      []  Bridge or Partial:                   []  Loose or Damaged teeth:   Comments: Patient was given the 5 CHG shower / bath instructions for TKA  surgery along with 2 bottles of the CHG soap. Patient will start this on:  05-09-2023 All questions were asked and answered, Patient voiced understanding of this process.   Activity level: Patient is able to climb a flight of stairs without difficulty; [x]  No CP  but would have _SOB and Leg pain  Patient can perform ADLs without assistance.   Anesthesia review:  Recent Pneumonia 02-2023, HTN, vertigo, MDD, ADHD, vapes daily, HOH- Tinnitus  Patient denies shortness of breath, fever, cough and chest pain at PAT appointment.  Patient verbalized understanding and agreement to the Pre-Surgical Instructions that were given to them at this PAT appointment. Patient was also educated of the need to review these PAT instructions  again prior to her surgery.I reviewed the appropriate phone numbers to call if they have any and questions or concerns.

## 2023-05-01 NOTE — Patient Instructions (Signed)
 SURGICAL WAITING ROOM VISITATION Patients having surgery or a procedure may have no more than 2 support people in the waiting area - these visitors may rotate in the visitor waiting room.   Due to an increase in RSV and influenza rates and associated hospitalizations, children ages 57 and under may not visit patients in [facility] hospitals. If the patient needs to stay at the hospital during part of their recovery, the visitor guidelines for inpatient rooms apply.  PRE-OP VISITATION  Pre-op nurse will coordinate an appropriate time for 1 support person to accompany the patient in pre-op.  This support person may not rotate.  This visitor will be contacted when the time is appropriate for the visitor to come back in the pre-op area.  Please refer to the [facility] website for the visitor guidelines for Inpatients (after your surgery is over and you are in a regular room).  You are not required to quarantine at this time prior to your surgery. However, you must do this: Hand Hygiene often Do NOT share personal items Notify your provider if you are in close contact with someone who has COVID or you develop fever 100.4 or greater, new onset of sneezing, cough, sore throat, shortness of breath or body aches.  If you test positive for Covid or have been in contact with anyone that has tested positive in the last 10 days please notify you surgeon.    Your procedure is scheduled on:  Friday  May 13, 2023  Report to [facility] Main Entrance: [facility] entrance where the [employer] is available.   Report to admitting at:  07:30   AM  Call this number if you have any questions or problems the morning of surgery [phone]  Do not eat food after Midnight the night prior to your surgery/procedure.  After Midnight you may have the following liquids until   07:00 AM DAY OF SURGERY  Clear Liquid Diet Water Black Coffee (sugar ok, NO MILK/CREAM OR CREAMERS)  Tea (sugar ok, NO  MILK/CREAM OR CREAMERS) regular and decaf                             Plain Jell-O  with no fruit (NO RED)                                           Fruit ices (not with fruit pulp, NO RED)                                     Popsicles (NO RED)                                                                  Juice: NO CITRUS JUICES: only apple, WHITE grape, WHITE cranberry Sports drinks like Gatorade or Powerade (NO RED)                   The day of surgery:  Drink ONE (1) Pre-Surgery Clear Ensure  at  07:00 AM the morning of  surgery. Drink in one sitting. Do not sip.  This drink was given to you during your hospital pre-op appointment visit. Nothing else to drink after completing the Pre-Surgery Clear Ensure : No candy, chewing gum or throat lozenges.    FOLLOW ANY ADDITIONAL PRE OP INSTRUCTIONS YOU RECEIVED FROM YOUR SURGEON'S OFFICE!!!   Oral Hygiene is also important to reduce your risk of infection.        Remember - BRUSH YOUR TEETH THE MORNING OF SURGERY WITH YOUR REGULAR TOOTHPASTE  Do NOT smoke after Midnight the night before surgery.  STOP TAKING all Vitamins, Herbs and supplements 1 week before your surgery.   Take ONLY these medicines the morning of surgery with A SIP OF WATER: omeprazole, bupropion. You may use your Flonase nasal spray and Refresh eye drops if needed.                  You may not have any metal on your body including hair pins, jewelry, and body piercing  Do not wear make-up, lotions, powders, perfumes or deodorant  Do not wear nail polish including gel and S&S, artificial / acrylic nails, or any other type of covering on natural nails including finger and toenails. If you have artificial nails, gel coating, etc., that needs to be removed by a nail salon, Please have this removed prior to surgery. Not doing so may mean that your surgery could be cancelled or delayed if the Surgeon or anesthesia staff feels like they are unable to monitor you safely.   Do  not shave 48 hours prior to surgery to avoid nicks in your skin which may contribute to postoperative infections.   Contacts, Hearing Aids, dentures or bridgework may not be worn into surgery. DENTURES WILL BE REMOVED PRIOR TO SURGERY PLEASE DO NOT APPLY "Poly grip" OR ADHESIVES!!!  You may bring a small overnight bag with you on the day of surgery, only pack items that are not valuable. [facility] IS NOT RESPONSIBLE   FOR VALUABLES THAT ARE LOST OR STOLEN.   Do not bring your home medications to the hospital. The Pharmacy will dispense medications listed on your medication list to you during your admission in the Hospital.  Special Instructions: Bring a copy of your healthcare power of attorney and living will documents the day of surgery, if you wish to have them scanned into your [facility] Records- EPIC  Please read over the following fact sheets you were given: IF YOU HAVE QUESTIONS ABOUT YOUR PRE-OP INSTRUCTIONS, PLEASE CALL [phone].     Pre-operative 5 CHG Bath Instructions   You can play a key role in reducing the risk of infection after surgery. Your skin needs to be as free of germs as possible. You can reduce the number of germs on your skin by washing with CHG (chlorhexidine gluconate) soap before surgery. CHG is an antiseptic soap that kills germs and continues to kill germs even after washing.   DO NOT use if you have an allergy to chlorhexidine/CHG or antibacterial soaps. If your skin becomes reddened or irritated, stop using the CHG and notify one of our RNs at [phone]  Please shower with the CHG soap starting 4 days before surgery using the following schedule: START SHOWERS ON   MONDAY  May 09, 2023  Please keep in mind the following:  DO NOT shave, including legs and underarms,  starting the day of your first shower.   You may shave your face at any point before/day of surgery.   Place clean sheets on your bed the day you start using CHG soap. Use a clean washcloth (not used since being washed) for each shower. DO NOT sleep with pets once you start using the CHG.   CHG Shower Instructions:  If you choose to wash your hair and private area, wash first with your normal shampoo/soap.  After you use shampoo/soap, rinse your hair and body thoroughly to remove shampoo/soap residue.  Turn the water OFF and apply about 3 tablespoons (45 ml) of CHG soap to a CLEAN washcloth.  Apply CHG soap ONLY FROM YOUR NECK DOWN TO YOUR TOES (washing for 3-5 minutes)  DO NOT use CHG soap on face, private areas, open wounds, or sores.  Pay special attention to the area where your surgery is being performed.  If you are having back surgery, having someone wash your back for you may be helpful.  Wait 2 minutes after CHG soap is applied, then you may rinse off the CHG soap.  Pat dry with a clean towel  Put on clean clothes/pajamas   If you choose to wear lotion, please use ONLY the CHG-compatible lotions on the back of this paper.     Additional instructions for the day of surgery: DO NOT APPLY any lotions, deodorants, cologne, or perfumes.   Put on clean/comfortable clothes.  Brush your teeth.  Ask your nurse before applying any prescription medications to the skin.      CHG Compatible Lotions   Aveeno Moisturizing lotion  Cetaphil Moisturizing Cream  Cetaphil Moisturizing Lotion  Clairol Herbal Essence Moisturizing Lotion, Dry Skin  Clairol Herbal Essence Moisturizing Lotion, Extra Dry Skin  Clairol Herbal Essence Moisturizing Lotion, Normal Skin  Curel Age Defying Therapeutic Moisturizing Lotion with Alpha Hydroxy  Curel Extreme Care Body Lotion  Curel Soothing Hands Moisturizing Hand Lotion  Curel Therapeutic Moisturizing Cream, Fragrance-Free  Curel Therapeutic  Moisturizing Lotion, Fragrance-Free  Curel Therapeutic Moisturizing Lotion, Original Formula  Eucerin Daily Replenishing Lotion  Eucerin Dry Skin Therapy Plus Alpha Hydroxy Crme  Eucerin Dry Skin Therapy Plus Alpha Hydroxy Lotion  Eucerin Original Crme  Eucerin Original Lotion  Eucerin Plus Crme Eucerin Plus Lotion  Eucerin TriLipid Replenishing Lotion  Keri Anti-Bacterial Hand Lotion  Keri Deep Conditioning Original Lotion Dry Skin Formula Softly Scented  Keri Deep Conditioning Original Lotion, Fragrance Free Sensitive Skin Formula  Keri Lotion Fast Absorbing Fragrance Free Sensitive Skin Formula  Keri Lotion Fast Absorbing Softly Scented Dry Skin Formula  Keri Original Lotion  Keri Skin Renewal Lotion Keri Silky Smooth Lotion  Keri Silky Smooth Sensitive Skin Lotion  Nivea Body Creamy Conditioning Oil  Nivea Body Extra Enriched Lotion  Nivea Body Original Lotion  Nivea Body Sheer Moisturizing Lotion Nivea Crme  Nivea Skin Firming Lotion  NutraDerm 30 Skin Lotion  NutraDerm Skin Lotion  NutraDerm Therapeutic Skin Cream  NutraDerm Therapeutic Skin Lotion  ProShield Protective Hand Cream  Provon moisturizing lotion   FAILURE TO FOLLOW THESE INSTRUCTIONS MAY RESULT IN THE CANCELLATION OF YOUR SURGERY  PATIENT SIGNATURE_________________________________  NURSE SIGNATURE__________________________________  ________________________________________________________________________       Abigail Wright    An incentive spirometer is a tool that can help keep your lungs clear and active. This tool measures how well you are filling your lungs with each breath. Taking  long deep breaths may help reverse or decrease the chance of developing breathing (pulmonary) problems (especially infection) following: A long period of time when you are unable to move or be active. BEFORE THE PROCEDURE  If the spirometer includes an indicator to show your best effort, your nurse or  respiratory therapist will set it to a desired goal. If possible, sit up straight or lean slightly forward. Try not to slouch. Hold the incentive spirometer in an upright position. INSTRUCTIONS FOR USE  Sit on the edge of your bed if possible, or sit up as far as you can in bed or on a chair. Hold the incentive spirometer in an upright position. Breathe out normally. Place the mouthpiece in your mouth and seal your lips tightly around it. Breathe in slowly and as deeply as possible, raising the piston or the ball toward the top of the column. Hold your breath for 3-5 seconds or for as long as possible. Allow the piston or ball to fall to the bottom of the column. Remove the mouthpiece from your mouth and breathe out normally. Rest for a few seconds and repeat Steps 1 through 7 at least 10 times every 1-2 hours when you are awake. Take your time and take a few normal breaths between deep breaths. The spirometer may include an indicator to show your best effort. Use the indicator as a goal to work toward during each repetition. After each set of 10 deep breaths, practice coughing to be sure your lungs are clear. If you have an incision (the cut made at the time of surgery), support your incision when coughing by placing a pillow or rolled up towels firmly against it. Once you are able to get out of bed, walk around indoors and cough well. You may stop using the incentive spirometer when instructed by your caregiver.  RISKS AND COMPLICATIONS Take your time so you do not get dizzy or light-headed. If you are in pain, you may need to take or ask for pain medication before doing incentive spirometry. It is harder to take a deep breath if you are having pain. AFTER USE Rest and breathe slowly and easily. It can be helpful to keep track of a log of your progress. Your caregiver can provide you with a simple table to help with this. If you are using the spirometer at home, follow these  instructions: SEEK MEDICAL CARE IF:  You are having difficultly using the spirometer. You have trouble using the spirometer as often as instructed. Your pain medication is not giving enough relief while using the spirometer. You develop fever of 100.5 F (38.1 C) or higher.                                                                                                    SEEK IMMEDIATE MEDICAL CARE IF:  You cough up bloody sputum that had not been present before. You develop fever of 102 F (38.9 C) or greater. You develop worsening pain at or near the incision site. MAKE SURE YOU:  Understand these instructions. Will watch your condition.  Will get help right away if you are not doing well or get worse. Document Released: 06/21/2006 Document Revised: 05/03/2011 Document Reviewed: 08/22/2006 [facility] Patient Information 2014 [place], [place].       If you would like to see a video about joint replacement:   [email or web address]

## 2023-05-02 ENCOUNTER — Encounter (HOSPITAL_COMMUNITY): Payer: Self-pay

## 2023-05-02 ENCOUNTER — Encounter (HOSPITAL_COMMUNITY)
Admission: RE | Admit: 2023-05-02 | Discharge: 2023-05-02 | Disposition: A | Discharge status: Home / Self Care | Payer: PRIVATE HEALTH INSURANCE | Source: Ambulatory Visit | Attending: Orthopaedic Surgery | Admitting: Orthopaedic Surgery

## 2023-05-02 ENCOUNTER — Other Ambulatory Visit: Payer: Self-pay

## 2023-05-02 VITALS — BP 130/87 | HR 95 | Temp 98.3°F | Resp 20 | Ht 68.0 in | Wt 250.0 lb

## 2023-05-02 DIAGNOSIS — Z01818 Encounter for other preprocedural examination: Secondary | ICD-10-CM

## 2023-05-02 DIAGNOSIS — M1712 Unilateral primary osteoarthritis, left knee: Secondary | ICD-10-CM | POA: Insufficient documentation

## 2023-05-02 DIAGNOSIS — Z01812 Encounter for preprocedural laboratory examination: Secondary | ICD-10-CM | POA: Insufficient documentation

## 2023-05-02 DIAGNOSIS — I10 Essential (primary) hypertension: Secondary | ICD-10-CM | POA: Insufficient documentation

## 2023-05-02 DIAGNOSIS — F909 Attention-deficit hyperactivity disorder, unspecified type: Secondary | ICD-10-CM | POA: Insufficient documentation

## 2023-05-02 DIAGNOSIS — Z79899 Other long term (current) drug therapy: Secondary | ICD-10-CM | POA: Diagnosis not present

## 2023-05-02 HISTORY — DX: Essential (primary) hypertension: I10

## 2023-05-02 HISTORY — DX: Pneumonia, unspecified organism: J18.9

## 2023-05-02 LAB — COMPREHENSIVE METABOLIC PANEL
ALT: 36 U/L (ref 0–44)
AST: 25 U/L (ref 15–41)
Albumin: 4 g/dL (ref 3.5–5.0)
Alkaline Phosphatase: 83 U/L (ref 38–126)
Anion gap: 10 (ref 5–15)
BUN: 20 mg/dL (ref 6–20)
CO2: 28 mmol/L (ref 22–32)
Calcium: 9.8 mg/dL (ref 8.9–10.3)
Chloride: 101 mmol/L (ref 98–111)
Creatinine, Ser: 0.93 mg/dL (ref 0.44–1.00)
GFR, Estimated: >60 mL/min (ref >60)
Glucose, Bld: 148 mg/dL — ABNORMAL HIGH (ref 70–99)
Potassium: 4.1 mmol/L (ref 3.5–5.1)
Sodium: 139 mmol/L (ref 135–145)
Total Bilirubin: 0.8 mg/dL (ref 0.0–1.2)
Total Protein: 7.3 g/dL (ref 6.5–8.1)

## 2023-05-02 LAB — CBC
HCT: 46.2 % — ABNORMAL HIGH (ref 36.0–46.0)
Hemoglobin: 14.7 g/dL (ref 12.0–15.0)
MCH: 28.4 pg (ref 26.0–34.0)
MCHC: 31.8 g/dL (ref 30.0–36.0)
MCV: 89.4 fL (ref 80.0–100.0)
Platelets: 236 10*3/uL (ref 150–400)
RBC: 5.17 MIL/uL — ABNORMAL HIGH (ref 3.87–5.11)
RDW: 13.8 % (ref 11.5–15.5)
WBC: 6.4 10*3/uL (ref 4.0–10.5)
nRBC: 0 % (ref 0.0–0.2)

## 2023-05-02 LAB — SURGICAL PCR SCREEN
MRSA, PCR: NEGATIVE
Staphylococcus aureus: NEGATIVE

## 2023-05-04 ENCOUNTER — Encounter: Payer: Self-pay | Admitting: Family Medicine

## 2023-05-04 ENCOUNTER — Ambulatory Visit (INDEPENDENT_AMBULATORY_CARE_PROVIDER_SITE_OTHER): Payer: PRIVATE HEALTH INSURANCE | Admitting: Family Medicine

## 2023-05-04 VITALS — BP 132/86 | HR 79 | Temp 98.6°F | Ht 68.0 in | Wt 261.8 lb

## 2023-05-04 DIAGNOSIS — R718 Other abnormality of red blood cells: Secondary | ICD-10-CM

## 2023-05-04 DIAGNOSIS — Z0000 Encounter for general adult medical examination without abnormal findings: Secondary | ICD-10-CM | POA: Diagnosis not present

## 2023-05-04 DIAGNOSIS — R0683 Snoring: Secondary | ICD-10-CM | POA: Diagnosis not present

## 2023-05-04 LAB — LIPID PANEL
Cholesterol: 190 mg/dL (ref 0–200)
HDL: 70.3 mg/dL (ref >39.00)
LDL Cholesterol: 86 mg/dL (ref 0–99)
NonHDL: 119.2
Total CHOL/HDL Ratio: 3
Triglycerides: 165 mg/dL — ABNORMAL HIGH (ref 0.0–149.0)
VLDL: 33 mg/dL (ref 0.0–40.0)

## 2023-05-04 LAB — COMPREHENSIVE METABOLIC PANEL
ALT: 35 U/L (ref 0–35)
AST: 21 U/L (ref 0–37)
Albumin: 4.7 g/dL (ref 3.5–5.2)
Alkaline Phosphatase: 101 U/L (ref 39–117)
BUN: 17 mg/dL (ref 6–23)
CO2: 32 meq/L (ref 19–32)
Calcium: 10 mg/dL (ref 8.4–10.5)
Chloride: 100 meq/L (ref 96–112)
Creatinine, Ser: 0.79 mg/dL (ref 0.40–1.20)
GFR: 83.89 mL/min (ref >60.00)
Glucose, Bld: 84 mg/dL (ref 70–99)
Potassium: 4.5 meq/L (ref 3.5–5.1)
Sodium: 139 meq/L (ref 135–145)
Total Bilirubin: 0.5 mg/dL (ref 0.2–1.2)
Total Protein: 7.1 g/dL (ref 6.0–8.3)

## 2023-05-04 LAB — CBC WITH DIFFERENTIAL/PLATELET
Basophils Absolute: 0 10*3/uL (ref 0.0–0.1)
Basophils Relative: 0.4 % (ref 0.0–3.0)
Eosinophils Absolute: 0.1 10*3/uL (ref 0.0–0.7)
Eosinophils Relative: 1.7 % (ref 0.0–5.0)
HCT: 44.8 % (ref 36.0–46.0)
Hemoglobin: 14.7 g/dL (ref 12.0–15.0)
Lymphocytes Relative: 43.2 % (ref 12.0–46.0)
Lymphs Abs: 2.7 10*3/uL (ref 0.7–4.0)
MCHC: 32.8 g/dL (ref 30.0–36.0)
MCV: 86.7 fl (ref 78.0–100.0)
Monocytes Absolute: 0.7 10*3/uL (ref 0.1–1.0)
Monocytes Relative: 11 % (ref 3.0–12.0)
Neutro Abs: 2.8 10*3/uL (ref 1.4–7.7)
Neutrophils Relative %: 43.7 % (ref 43.0–77.0)
Platelets: 262 10*3/uL (ref 150.0–400.0)
RBC: 5.17 Mil/uL — ABNORMAL HIGH (ref 3.87–5.11)
RDW: 14.5 % (ref 11.5–15.5)
WBC: 6.3 10*3/uL (ref 4.0–10.5)

## 2023-05-04 NOTE — Progress Notes (Signed)
 Chief Complaint  Patient presents with   Annual Exam    Patient presents today for a physical exam.     Well Woman Abigail Wright is here for a complete physical.   Her last physical was >1 year ago.  Current diet: in general, a "healthy" diet. Current exercise: elliptical machine. Weight is stable and she denies fatigue out of ordinary. No LMP recorded. Patient has had a hysterectomy. Seatbelt? Yes Advanced directive? Yes  Health Maintenance Pap/HPV- Yes Mammogram- Yes Colon cancer screening-Yes Shingrix- Yes Tetanus- Yes Hep C screening- Yes HIV screening- Yes  Past Medical History:  Diagnosis Date   ADD (attention deficit disorder)    Anxiety    Arthritis    Blood transfusion without reported diagnosis    1993   Depression    GERD (gastroesophageal reflux disease)    in past but recently controlled with diet and exercise.   Hyperlipidemia    Hypertension    Pneumonia      Past Surgical History:  Procedure Laterality Date   ABDOMINAL HYSTERECTOMY  1993   BUNIONECTOMY Left    CARPAL TUNNEL RELEASE Bilateral    cyst removal from left hand Bilateral    FOOT SURGERY Left    plantar fascicitis surgery   KNEE CARTILAGE SURGERY Left    SHOULDER ARTHROSCOPY WITH SUBACROMIAL DECOMPRESSION AND OPEN ROTATOR C Right 07/07/2021   Procedure: RIGHT SHOULDER ARTHROSCOPY WITH SUBACROMIAL DECOMPRESSION / ROTATOR CUFF REPAIR/ DISTAL CLAVICLE RESECTION /BICEPS TENODESIS;  Surgeon: Valeria Batman, MD;  Location: [facility];  Service: Orthopedics;  Laterality: Right;   TENDON REPAIR Left 03/21/2017   Left hand    TONSILLECTOMY      Medications  Current Outpatient Medications on File Prior to Visit  Medication Sig Dispense Refill   amphetamine-dextroamphetamine (ADDERALL) 30 MG tablet Take 1 tablet by mouth 2 (two) times daily. (Patient taking differently: Take 30 mg by mouth in the morning.) 60 tablet 0   aspirin EC 81 MG tablet Take 1 tablet (81 mg total) by mouth daily.  Swallow whole.     atorvastatin (LIPITOR) 40 MG tablet Take 1 tablet (40 mg total) by mouth daily. 90 tablet 3   Biotin-Vitamin C (HAIR SKIN NAILS GUMMIES PO) Take 2 tablets by mouth in the morning.     buPROPion (WELLBUTRIN XL) 300 MG 24 hr tablet Take 1 tablet (300 mg total) by mouth daily. 90 tablet 3   carboxymethylcellulose (REFRESH TEARS) 0.5 % SOLN Place 1 drop into both eyes in the morning.     citalopram (CELEXA) 20 MG tablet TAKE 1 TABLET BY MOUTH EACH DAY (Patient taking differently: Take 20 mg by mouth at bedtime.) 90 tablet 3   clonazePAM (KLONOPIN) 0.5 MG tablet Take 1 tablet (0.5 mg total) by mouth at bedtime. 90 tablet 2   diclofenac (VOLTAREN) 75 MG EC tablet Take 75 mg by mouth 2 (two) times daily.     fluticasone (FLONASE) 50 MCG/ACT nasal spray Place 1 spray into both nostrils in the morning.     Loratadine 10 MG CAPS Take 10 mg by mouth daily.     Multiple Vitamin (MULTIVITAMIN WITH MINERALS) TABS tablet Take 1 tablet by mouth in the morning. One A Day for Women 50+     omeprazole (PRILOSEC OTC) 20 MG tablet Take 20 mg by mouth daily before breakfast.     Psyllium (METAMUCIL PO) Take 3 tablets by mouth in the morning. Metamucil Fiber Gummies Plus Probiotics for Digestive Health  Allergies No Known Allergies  Review of Systems: Constitutional:  no unexpected weight changes Eye:  no recent significant change in vision Ear/Nose/Mouth/Throat:  Ears:  no recent change in hearing Nose/Mouth/Throat:  no complaints of nasal congestion, no sore throat Cardiovascular: no chest pain Respiratory:  no shortness of breath Gastrointestinal:  no abdominal pain, no change in bowel habits GU:  Female: negative for dysuria or pelvic pain Musculoskeletal/Extremities:  no pain of the joints Integumentary (Skin/Breast):  no abnormal skin lesions reported Neurologic:  no headaches Endocrine:  denies fatigue  Exam BP 132/86   Pulse 79   Temp 98.6 F (37 C)   Ht 5\' 8"  (1.727 m)    Wt 261 lb 12.8 oz (118.8 kg)   SpO2 99%   BMI 39.81 kg/m  General:  well developed, well nourished, in no apparent distress Skin:  no significant moles, warts, or growths Head:  no masses, lesions, or tenderness Eyes:  pupils equal and round, sclera anicteric without injection Ears:  canals without lesions, TMs shiny without retraction, no obvious effusion, no erythema Nose:  nares patent, mucosa normal, and no drainage  Throat/Pharynx:  lips and gingiva without lesion; tongue and uvula midline; non-inflamed pharynx; no exudates or postnasal drainage Neck: neck supple without adenopathy, thyromegaly, or masses Lungs:  clear to auscultation, breath sounds equal bilaterally, no respiratory distress Cardio:  regular rate and rhythm, no LE edema Abdomen:  abdomen soft, nontender; bowel sounds normal; no masses or organomegaly Genital: Defer to GYN Musculoskeletal:  symmetrical muscle groups noted without atrophy or deformity Extremities:  no clubbing, cyanosis, or edema, no deformities, no skin discoloration Neuro:  gait normal; deep tendon reflexes normal and symmetric Psych: well oriented with normal range of affect and appropriate judgment/insight  Assessment and Plan  Well adult exam - Plan: Lipid panel, CBC w/Diff, Comprehensive metabolic panel  Elevated hematocrit - Plan: Ambulatory referral to Neurology  Snoring - Plan: Ambulatory referral to Neurology   Well 58 y.o. female. Counseled on diet and exercise. Advanced directive form requested today.  Other orders as above. Follow up in 6 mo. The patient voiced understanding and agreement to the plan.  [name], DO 05/04/23 10:28 AM

## 2023-05-04 NOTE — Patient Instructions (Signed)
 Give [place] 2-3 business days to get the results of your labs back.   Keep the diet clean and stay active.  Please get me a copy of your advanced directive form at your convenience.   Let us know if you need anything.

## 2023-05-12 NOTE — H&P (Signed)
 TOTAL KNEE ADMISSION H&P  Patient is being admitted for left total knee arthroplasty.  Subjective:  Chief Complaint:left knee pain.  HPI: Abigail Wright, 58 y.o. female, has a history of pain and functional disability in the left knee due to arthritis and has failed non-surgical conservative treatments for greater than 12 weeks to includeNSAID's and/or analgesics, corticosteriod injections, flexibility and strengthening excercises, weight reduction as appropriate, and activity modification.  Onset of symptoms was gradual, starting 2 years ago with gradually worsening course since that time. The patient noted prior procedures on the knee to include  arthroscopy on the left knee(s).  Patient currently rates pain in the left knee(s) at 10 out of 10 with activity. Patient has night pain, worsening of pain with activity and weight bearing, pain that interferes with activities of daily living, pain with passive range of motion, crepitus, and joint swelling.  Patient has evidence of subchondral sclerosis, periarticular osteophytes, and joint space narrowing by imaging studies. There is no active infection.  Patient Active Problem List   Diagnosis Date Noted   Chronic heel pain, right 01/27/2023   Unilateral primary osteoarthritis, left knee 08/19/2022   Pain in right knee 08/04/2021   Olecranon bursitis, right elbow 08/04/2021   Complete tear of right rotator cuff 07/14/2021   Impingement syndrome of right shoulder 06/17/2021   Major depressive disorder, recurrent episode, moderate (HCC) 03/06/2021   Pain in left knee 11/04/2017   Benign paroxysmal positional vertigo of left ear 05/15/2015   Family history of breast cancer in mother 08/02/2011   Past Medical History:  Diagnosis Date   ADD (attention deficit disorder)    Anxiety    Arthritis    Blood transfusion without reported diagnosis    1993   Depression    GERD (gastroesophageal reflux disease)    in past but recently controlled with  diet and exercise.   Hyperlipidemia    Hypertension    Pneumonia     Past Surgical History:  Procedure Laterality Date   ABDOMINAL HYSTERECTOMY  1993   BUNIONECTOMY Left    CARPAL TUNNEL RELEASE Bilateral    cyst removal from left hand Bilateral    FOOT SURGERY Left    plantar fascicitis surgery   KNEE CARTILAGE SURGERY Left    SHOULDER ARTHROSCOPY WITH SUBACROMIAL DECOMPRESSION AND OPEN ROTATOR C Right 07/07/2021   Procedure: RIGHT SHOULDER ARTHROSCOPY WITH SUBACROMIAL DECOMPRESSION / ROTATOR CUFF REPAIR/ DISTAL CLAVICLE RESECTION /BICEPS TENODESIS;  Surgeon: Valeria Batman, MD;  Location: [facility];  Service: Orthopedics;  Laterality: Right;   TENDON REPAIR Left 03/21/2017   Left hand    TONSILLECTOMY      No current facility-administered medications for this encounter.   Current Outpatient Medications  Medication Sig Dispense Refill Last Dose/Taking   amphetamine-dextroamphetamine (ADDERALL) 30 MG tablet Take 1 tablet by mouth 2 (two) times daily. (Patient taking differently: Take 30 mg by mouth in the morning.) 60 tablet 0 Taking Differently   aspirin EC 81 MG tablet Take 1 tablet (81 mg total) by mouth daily. Swallow whole.   Taking   atorvastatin (LIPITOR) 40 MG tablet Take 1 tablet (40 mg total) by mouth daily. 90 tablet 3 Taking   Biotin-Vitamin C (HAIR SKIN NAILS GUMMIES PO) Take 2 tablets by mouth in the morning.   Taking   buPROPion (WELLBUTRIN XL) 300 MG 24 hr tablet Take 1 tablet (300 mg total) by mouth daily. 90 tablet 3 Taking   carboxymethylcellulose (REFRESH TEARS) 0.5 % SOLN Place 1 drop  into both eyes in the morning.   Taking   citalopram (CELEXA) 20 MG tablet TAKE 1 TABLET BY MOUTH EACH DAY (Patient taking differently: Take 20 mg by mouth at bedtime.) 90 tablet 3 Taking Differently   clonazePAM (KLONOPIN) 0.5 MG tablet Take 1 tablet (0.5 mg total) by mouth at bedtime. 90 tablet 2 Taking   fluticasone (FLONASE) 50 MCG/ACT nasal spray Place 1 spray into both  nostrils in the morning.   Taking   Multiple Vitamin (MULTIVITAMIN WITH MINERALS) TABS tablet Take 1 tablet by mouth in the morning. One A Day for Women 50+   Taking   omeprazole (PRILOSEC OTC) 20 MG tablet Take 20 mg by mouth daily before breakfast.   Taking   Psyllium (METAMUCIL PO) Take 3 tablets by mouth in the morning. Metamucil Fiber Gummies Plus Probiotics for Digestive Health   Taking   diclofenac (VOLTAREN) 75 MG EC tablet Take 75 mg by mouth 2 (two) times daily.      Loratadine 10 MG CAPS Take 10 mg by mouth daily.      No Known Allergies  Social History   Tobacco Use   Smoking status: Former   Smokeless tobacco: Never  Substance Use Topics   Alcohol use: Yes    Comment: occasionally    Family History  Problem Relation Age of Onset   Cancer Mother    Diabetes Father    Heart disease Paternal Uncle    Hypertension Paternal Uncle    Colon cancer Neg Hx    Esophageal cancer Neg Hx    Rectal cancer Neg Hx    Stomach cancer Neg Hx    Stroke Neg Hx      Review of Systems  Objective:  Physical Exam Vitals reviewed.  Constitutional:      Appearance: Normal appearance. She is obese.  HENT:     Head: Normocephalic and atraumatic.  Eyes:     Extraocular Movements: Extraocular movements intact.     Pupils: Pupils are equal, round, and reactive to light.  Cardiovascular:     Rate and Rhythm: Normal rate and regular rhythm.     Pulses: Normal pulses.  Pulmonary:     Effort: Pulmonary effort is normal.     Breath sounds: Normal breath sounds.  Abdominal:     Palpations: Abdomen is soft.  Musculoskeletal:     Cervical back: Normal range of motion and neck supple.     Left knee: Effusion, bony tenderness and crepitus present. Decreased range of motion. Tenderness present over the medial joint line and lateral joint line. Abnormal alignment.  Neurological:     Mental Status: She is alert and oriented to person, place, and time.  Psychiatric:        Behavior: Behavior  normal.     Vital signs in last 24 hours:    Labs:   Estimated body mass index is 39.81 kg/m as calculated from the following:   Height as of 05/04/23: 5\' 8"  (1.727 m).   Weight as of 05/04/23: 118.8 kg.   Imaging Review Plain radiographs demonstrate severe degenerative joint disease of the left knee(s). The overall alignment ismild varus. The bone quality appears to be excellent for age and reported activity level.      Assessment/Plan:  End stage arthritis, left knee   The patient history, physical examination, clinical judgment of the provider and imaging studies are consistent with end stage degenerative joint disease of the left knee(s) and total knee arthroplasty is deemed medically  necessary. The treatment options including medical management, injection therapy arthroscopy and arthroplasty were discussed at length. The risks and benefits of total knee arthroplasty were presented and reviewed. The risks due to aseptic loosening, infection, stiffness, patella tracking problems, thromboembolic complications and other imponderables were discussed. The patient acknowledged the explanation, agreed to proceed with the plan and consent was signed. Patient is being admitted for inpatient treatment for surgery, pain control, PT, OT, prophylactic antibiotics, VTE prophylaxis, progressive ambulation and ADL's and discharge planning. The patient is planning to be discharged home with home health services

## 2023-05-13 ENCOUNTER — Ambulatory Visit (HOSPITAL_COMMUNITY): Payer: PRIVATE HEALTH INSURANCE | Admitting: Physician Assistant

## 2023-05-13 ENCOUNTER — Other Ambulatory Visit: Payer: Self-pay

## 2023-05-13 ENCOUNTER — Encounter (HOSPITAL_COMMUNITY): Payer: Self-pay | Admitting: Orthopaedic Surgery

## 2023-05-13 ENCOUNTER — Observation Stay (HOSPITAL_COMMUNITY): Payer: PRIVATE HEALTH INSURANCE

## 2023-05-13 ENCOUNTER — Observation Stay (HOSPITAL_COMMUNITY)
Admission: RE | Admit: 2023-05-13 | Discharge: 2023-05-14 | Disposition: A | Discharge status: Home / Self Care | Payer: PRIVATE HEALTH INSURANCE | Source: Ambulatory Visit | Attending: Orthopaedic Surgery | Admitting: Orthopaedic Surgery

## 2023-05-13 ENCOUNTER — Ambulatory Visit (HOSPITAL_BASED_OUTPATIENT_CLINIC_OR_DEPARTMENT_OTHER): Payer: PRIVATE HEALTH INSURANCE | Admitting: Anesthesiology

## 2023-05-13 ENCOUNTER — Encounter (HOSPITAL_COMMUNITY)
Admission: RE | Disposition: A | Discharge status: Home / Self Care | Payer: Self-pay | Source: Ambulatory Visit | Attending: Orthopaedic Surgery

## 2023-05-13 DIAGNOSIS — Z7982 Long term (current) use of aspirin: Secondary | ICD-10-CM | POA: Diagnosis not present

## 2023-05-13 DIAGNOSIS — M1712 Unilateral primary osteoarthritis, left knee: Secondary | ICD-10-CM

## 2023-05-13 DIAGNOSIS — Z87891 Personal history of nicotine dependence: Secondary | ICD-10-CM | POA: Insufficient documentation

## 2023-05-13 DIAGNOSIS — Z79899 Other long term (current) drug therapy: Secondary | ICD-10-CM | POA: Diagnosis not present

## 2023-05-13 DIAGNOSIS — Z96652 Presence of left artificial knee joint: Secondary | ICD-10-CM

## 2023-05-13 DIAGNOSIS — I10 Essential (primary) hypertension: Secondary | ICD-10-CM | POA: Diagnosis not present

## 2023-05-13 SURGERY — ARTHROPLASTY, KNEE, TOTAL
Anesthesia: Regional | Site: Knee | Laterality: Left

## 2023-05-13 MED ORDER — AMPHETAMINE-DEXTROAMPHETAMINE 30 MG PO TABS: 30.0000 mg | ORAL_TABLET | Freq: Every day | ORAL | Status: DC

## 2023-05-13 MED ORDER — MENTHOL 3 MG MT LOZG: 1.0000 | LOZENGE | OROMUCOSAL | Status: DC | PRN

## 2023-05-13 MED ORDER — CITALOPRAM HYDROBROMIDE 20 MG PO TABS
20.0000 mg | ORAL_TABLET | Freq: Every day | ORAL | Status: DC
  Administered 2023-05-13: 20 mg via ORAL
  Filled 2023-05-13: qty 1

## 2023-05-13 MED ORDER — PROPOFOL 500 MG/50ML IV EMUL
INTRAVENOUS | Status: AC
  Filled 2023-05-13: qty 50

## 2023-05-13 MED ORDER — DIPHENHYDRAMINE HCL 12.5 MG/5ML PO ELIX: 12.5000 mg | ORAL_SOLUTION | ORAL | Status: DC | PRN

## 2023-05-13 MED ORDER — ONDANSETRON HCL 4 MG/2ML IJ SOLN: 4.0000 mg | Freq: Four times a day (QID) | INTRAMUSCULAR | Status: DC | PRN

## 2023-05-13 MED ORDER — CLONAZEPAM 0.5 MG PO TABS
0.5000 mg | ORAL_TABLET | Freq: Every day | ORAL | Status: DC
  Administered 2023-05-13: 0.5 mg via ORAL
  Filled 2023-05-13: qty 1

## 2023-05-13 MED ORDER — SODIUM CHLORIDE 0.9 % IR SOLN
Status: DC | PRN
  Administered 2023-05-13: 1000 mL

## 2023-05-13 MED ORDER — BUPIVACAINE-EPINEPHRINE (PF) 0.25% -1:200000 IJ SOLN
INTRAMUSCULAR | Status: AC
  Filled 2023-05-13: qty 30

## 2023-05-13 MED ORDER — PHENOL 1.4 % MT LIQD: 1.0000 | OROMUCOSAL | Status: DC | PRN

## 2023-05-13 MED ORDER — 0.9 % SODIUM CHLORIDE (POUR BTL) OPTIME
TOPICAL | Status: DC | PRN
  Administered 2023-05-13: 1000 mL

## 2023-05-13 MED ORDER — PROPOFOL 1000 MG/100ML IV EMUL
INTRAVENOUS | Status: AC
Start: 2023-05-13 — End: ?
  Filled 2023-05-13: qty 100

## 2023-05-13 MED ORDER — CARBOXYMETHYLCELLULOSE SODIUM 0.5 % OP SOLN
1.0000 [drp] | Freq: Every morning | OPHTHALMIC | Status: DC
Start: 2023-05-14 — End: 2023-05-13

## 2023-05-13 MED ORDER — PROPOFOL 10 MG/ML IV BOLUS
INTRAVENOUS | Status: DC | PRN
  Administered 2023-05-13: 20 mg via INTRAVENOUS
  Administered 2023-05-13: 10 mg via INTRAVENOUS

## 2023-05-13 MED ORDER — FENTANYL CITRATE PF 50 MCG/ML IJ SOSY: 25.0000 ug | PREFILLED_SYRINGE | INTRAMUSCULAR | Status: DC | PRN

## 2023-05-13 MED ORDER — DOCUSATE SODIUM 100 MG PO CAPS
100.0000 mg | ORAL_CAPSULE | Freq: Two times a day (BID) | ORAL | Status: DC
  Administered 2023-05-13 – 2023-05-14 (×2): 100 mg via ORAL
  Filled 2023-05-13 (×2): qty 1

## 2023-05-13 MED ORDER — PROPOFOL 1000 MG/100ML IV EMUL
INTRAVENOUS | Status: AC
  Filled 2023-05-13: qty 100

## 2023-05-13 MED ORDER — HYDROMORPHONE HCL 1 MG/ML IJ SOLN: 0.5000 mg | INTRAMUSCULAR | Status: DC | PRN

## 2023-05-13 MED ORDER — POVIDONE-IODINE 10 % EX SWAB
2.0000 | Freq: Once | CUTANEOUS | Status: AC
  Administered 2023-05-13: 2 via TOPICAL

## 2023-05-13 MED ORDER — SODIUM CHLORIDE 0.9 % IV SOLN: INTRAVENOUS | Status: DC

## 2023-05-13 MED ORDER — OXYCODONE HCL 5 MG/5ML PO SOLN: 5.0000 mg | Freq: Once | ORAL | Status: DC | PRN

## 2023-05-13 MED ORDER — ONDANSETRON HCL 4 MG PO TABS: 4.0000 mg | ORAL_TABLET | Freq: Four times a day (QID) | ORAL | Status: DC | PRN

## 2023-05-13 MED ORDER — OXYCODONE HCL 5 MG PO TABS: 5.0000 mg | ORAL_TABLET | Freq: Once | ORAL | Status: DC | PRN

## 2023-05-13 MED ORDER — AMISULPRIDE (ANTIEMETIC) 5 MG/2ML IV SOLN: 10.0000 mg | Freq: Once | INTRAVENOUS | Status: DC | PRN

## 2023-05-13 MED ORDER — ACETAMINOPHEN 325 MG PO TABS
325.0000 mg | ORAL_TABLET | Freq: Four times a day (QID) | ORAL | Status: DC | PRN
  Administered 2023-05-13: 650 mg via ORAL
  Filled 2023-05-13: qty 2

## 2023-05-13 MED ORDER — CHLORHEXIDINE GLUCONATE 0.12 % MT SOLN
15.0000 mL | Freq: Once | OROMUCOSAL | Status: AC
  Administered 2023-05-13: 15 mL via OROMUCOSAL

## 2023-05-13 MED ORDER — ONDANSETRON HCL 4 MG/2ML IJ SOLN
INTRAMUSCULAR | Status: DC | PRN
  Administered 2023-05-13: 4 mg via INTRAVENOUS

## 2023-05-13 MED ORDER — STERILE WATER FOR IRRIGATION IR SOLN
Status: DC | PRN
  Administered 2023-05-13: 1000 mL

## 2023-05-13 MED ORDER — OXYCODONE HCL 5 MG PO TABS
10.0000 mg | ORAL_TABLET | ORAL | Status: DC | PRN
  Administered 2023-05-13: 10 mg via ORAL
  Filled 2023-05-13: qty 2

## 2023-05-13 MED ORDER — PHENYLEPHRINE 80 MCG/ML (10ML) SYRINGE FOR IV PUSH (FOR BLOOD PRESSURE SUPPORT)
PREFILLED_SYRINGE | INTRAVENOUS | Status: DC | PRN
  Administered 2023-05-13: 80 ug via INTRAVENOUS

## 2023-05-13 MED ORDER — FENTANYL CITRATE PF 50 MCG/ML IJ SOSY
50.0000 ug | PREFILLED_SYRINGE | Freq: Once | INTRAMUSCULAR | Status: DC
  Filled 2023-05-13: qty 2

## 2023-05-13 MED ORDER — METOCLOPRAMIDE HCL 5 MG PO TABS: 5.0000 mg | ORAL_TABLET | Freq: Three times a day (TID) | ORAL | Status: DC | PRN

## 2023-05-13 MED ORDER — BUPIVACAINE IN DEXTROSE 0.75-8.25 % IT SOLN
INTRATHECAL | Status: DC | PRN
Start: 2023-05-13 — End: 2023-05-13
  Administered 2023-05-13: 1.6 mL via INTRATHECAL

## 2023-05-13 MED ORDER — MIDAZOLAM HCL 2 MG/2ML IJ SOLN
1.0000 mg | Freq: Once | INTRAMUSCULAR | Status: AC
  Administered 2023-05-13: 2 mg via INTRAVENOUS
  Filled 2023-05-13: qty 2

## 2023-05-13 MED ORDER — LORATADINE 10 MG PO CAPS: 10.0000 mg | ORAL_CAPSULE | Freq: Every day | ORAL | Status: DC

## 2023-05-13 MED ORDER — KETOROLAC TROMETHAMINE 15 MG/ML IJ SOLN
7.5000 mg | Freq: Four times a day (QID) | INTRAMUSCULAR | Status: AC
  Administered 2023-05-13 – 2023-05-14 (×3): 7.5 mg via INTRAVENOUS
  Filled 2023-05-13 (×3): qty 1

## 2023-05-13 MED ORDER — ASPIRIN 81 MG PO CHEW
81.0000 mg | CHEWABLE_TABLET | Freq: Two times a day (BID) | ORAL | Status: DC
  Administered 2023-05-13 – 2023-05-14 (×2): 81 mg via ORAL
  Filled 2023-05-13 (×2): qty 1

## 2023-05-13 MED ORDER — OXYCODONE HCL 5 MG PO TABS
5.0000 mg | ORAL_TABLET | ORAL | Status: DC | PRN
  Administered 2023-05-13 – 2023-05-14 (×2): 10 mg via ORAL
  Administered 2023-05-14 (×2): 5 mg via ORAL
  Filled 2023-05-13 (×2): qty 1
  Filled 2023-05-13 (×2): qty 2

## 2023-05-13 MED ORDER — LACTATED RINGERS IV SOLN: INTRAVENOUS | Status: DC

## 2023-05-13 MED ORDER — CEFAZOLIN SODIUM-DEXTROSE 2-4 GM/100ML-% IV SOLN
2.0000 g | INTRAVENOUS | Status: AC
  Administered 2023-05-13: 2 g via INTRAVENOUS
  Filled 2023-05-13: qty 100

## 2023-05-13 MED ORDER — TIZANIDINE HCL 4 MG PO TABS
4.0000 mg | ORAL_TABLET | Freq: Four times a day (QID) | ORAL | Status: DC | PRN
  Administered 2023-05-13 – 2023-05-14 (×3): 4 mg via ORAL
  Filled 2023-05-13 (×3): qty 1

## 2023-05-13 MED ORDER — CEFAZOLIN SODIUM-DEXTROSE 2-4 GM/100ML-% IV SOLN
2.0000 g | Freq: Four times a day (QID) | INTRAVENOUS | Status: AC
  Administered 2023-05-13 (×2): 2 g via INTRAVENOUS
  Filled 2023-05-13 (×2): qty 100

## 2023-05-13 MED ORDER — LORATADINE 10 MG PO TABS
10.0000 mg | ORAL_TABLET | Freq: Every day | ORAL | Status: DC
  Administered 2023-05-13 – 2023-05-14 (×2): 10 mg via ORAL
  Filled 2023-05-13 (×2): qty 1

## 2023-05-13 MED ORDER — POLYVINYL ALCOHOL 1.4 % OP SOLN
1.0000 [drp] | Freq: Every day | OPHTHALMIC | Status: DC
  Filled 2023-05-13: qty 15

## 2023-05-13 MED ORDER — ORAL CARE MOUTH RINSE: 15.0000 mL | Freq: Once | OROMUCOSAL | Status: AC

## 2023-05-13 MED ORDER — BUPROPION HCL ER (XL) 300 MG PO TB24
300.0000 mg | ORAL_TABLET | Freq: Every day | ORAL | Status: DC
  Administered 2023-05-14: 300 mg via ORAL
  Filled 2023-05-13: qty 1

## 2023-05-13 MED ORDER — BUPIVACAINE-EPINEPHRINE 0.25% -1:200000 IJ SOLN
INTRAMUSCULAR | Status: DC | PRN
  Administered 2023-05-13: 30 mL

## 2023-05-13 MED ORDER — PANTOPRAZOLE SODIUM 40 MG PO TBEC
40.0000 mg | DELAYED_RELEASE_TABLET | Freq: Every day | ORAL | Status: DC
  Administered 2023-05-13 – 2023-05-14 (×2): 40 mg via ORAL
  Filled 2023-05-13 (×2): qty 1

## 2023-05-13 MED ORDER — BUPIVACAINE-EPINEPHRINE (PF) 0.5% -1:200000 IJ SOLN
INTRAMUSCULAR | Status: DC | PRN
  Administered 2023-05-13: 30 mL via PERINEURAL

## 2023-05-13 MED ORDER — METOCLOPRAMIDE HCL 5 MG/ML IJ SOLN: 5.0000 mg | Freq: Three times a day (TID) | INTRAMUSCULAR | Status: DC | PRN

## 2023-05-13 MED ORDER — TRANEXAMIC ACID-NACL 1000-0.7 MG/100ML-% IV SOLN
1000.0000 mg | INTRAVENOUS | Status: AC
  Administered 2023-05-13: 1000 mg via INTRAVENOUS
  Filled 2023-05-13: qty 100

## 2023-05-13 MED ORDER — ALUM & MAG HYDROXIDE-SIMETH 200-200-20 MG/5ML PO SUSP: 30.0000 mL | ORAL | Status: DC | PRN

## 2023-05-13 MED ORDER — ACETAMINOPHEN 10 MG/ML IV SOLN: 1000.0000 mg | Freq: Once | INTRAVENOUS | Status: DC | PRN

## 2023-05-13 MED ORDER — PROPOFOL 500 MG/50ML IV EMUL
INTRAVENOUS | Status: DC | PRN
  Administered 2023-05-13: 150 ug/kg/min via INTRAVENOUS

## 2023-05-13 SURGICAL SUPPLY — 57 items
BAG COUNTER SPONGE SURGICOUNT (BAG) IMPLANT
BAG ZIPLOCK 12X15 (MISCELLANEOUS) ×1 IMPLANT
BENZOIN TINCTURE PRP APPL 2/3 (GAUZE/BANDAGES/DRESSINGS) IMPLANT
BLADE SAG 18X100X1.27 (BLADE) ×1 IMPLANT
BLADE SURG SZ10 CARB STEEL (BLADE) IMPLANT
BNDG ELASTIC 6INX 5YD STR LF (GAUZE/BANDAGES/DRESSINGS) ×2 IMPLANT
BOWL SMART MIX CTS (DISPOSABLE) IMPLANT
CATH FOLEY 2WAY SLVR 5CC 12FR (CATHETERS) IMPLANT
CEMENT BONE R 1X40 (Cement) IMPLANT
COMP FEM KNEE STD PS 7 LT (Joint) ×1 IMPLANT
COMP MED POLY AS PERS S6-7 12 (Joint) ×1 IMPLANT
COMP PATELLA 3 PEG 29X9 (Joint) ×1 IMPLANT
COMPONENT FEM KNEE STD PS 7 LT (Joint) IMPLANT
COMPONENT MED PLY PERSS6-7 12 (Joint) IMPLANT
COMPONENT PATELLA 3 PEG 29X9 (Joint) IMPLANT
COOLER ICEMAN CLASSIC (MISCELLANEOUS) ×1 IMPLANT
COVER SURGICAL LIGHT HANDLE (MISCELLANEOUS) ×1 IMPLANT
CUFF TRNQT CYL 34X4.125X (TOURNIQUET CUFF) ×1 IMPLANT
DRAPE INCISE IOBAN 66X45 STRL (DRAPES) ×1 IMPLANT
DRAPE U-SHAPE 47X51 STRL (DRAPES) ×1 IMPLANT
DURAPREP 26ML APPLICATOR (WOUND CARE) ×1 IMPLANT
ELECT BLADE TIP CTD 4 INCH (ELECTRODE) ×1 IMPLANT
ELECT REM PT RETURN 15FT ADLT (MISCELLANEOUS) ×1 IMPLANT
GAUZE PAD ABD 8X10 STRL (GAUZE/BANDAGES/DRESSINGS) ×2 IMPLANT
GAUZE SPONGE 4X4 12PLY STRL (GAUZE/BANDAGES/DRESSINGS) ×1 IMPLANT
GAUZE XEROFORM 1X8 LF (GAUZE/BANDAGES/DRESSINGS) IMPLANT
GLOVE BIO SURGEON STRL SZ7.5 (GLOVE) ×1 IMPLANT
GLOVE BIOGEL PI IND STRL 8 (GLOVE) ×2 IMPLANT
GLOVE ECLIPSE 8.0 STRL XLNG CF (GLOVE) ×1 IMPLANT
GOWN STRL REUS W/ TWL XL LVL3 (GOWN DISPOSABLE) ×2 IMPLANT
HOLDER FOLEY CATH W/STRAP (MISCELLANEOUS) IMPLANT
IMMOBILIZER KNEE 20 (SOFTGOODS) ×1 IMPLANT
IMMOBILIZER KNEE 20 THIGH 36 (SOFTGOODS) ×1 IMPLANT
KIT TURNOVER KIT A (KITS) IMPLANT
MANIFOLD NEPTUNE II (INSTRUMENTS) ×1 IMPLANT
NS IRRIG 1000ML POUR BTL (IV SOLUTION) ×1 IMPLANT
PACK TOTAL KNEE CUSTOM (KITS) ×1 IMPLANT
PAD COLD SHLDR WRAP-ON (PAD) ×1 IMPLANT
PADDING CAST COTTON 6X4 STRL (CAST SUPPLIES) ×2 IMPLANT
PIN DRILL HDLS TROCAR 75 4PK (PIN) IMPLANT
PROTECTOR NERVE ULNAR (MISCELLANEOUS) ×1 IMPLANT
SCREW FEMALE HEX FIX 25X2.5 (ORTHOPEDIC DISPOSABLE SUPPLIES) IMPLANT
SET HNDPC FAN SPRY TIP SCT (DISPOSABLE) ×1 IMPLANT
SET PAD KNEE POSITIONER (MISCELLANEOUS) ×1 IMPLANT
SPIKE FLUID TRANSFER (MISCELLANEOUS) IMPLANT
STAPLER SKIN PROX WIDE 3.9 (STAPLE) IMPLANT
STEM TIB PS KNEE D 0D LT (Stem) IMPLANT
STRIP CLOSURE SKIN 1/2X4 (GAUZE/BANDAGES/DRESSINGS) IMPLANT
SUT MNCRL AB 4-0 PS2 18 (SUTURE) IMPLANT
SUT VIC AB 0 CT1 27XBRD ANTBC (SUTURE) ×1 IMPLANT
SUT VIC AB 1 CT1 36 (SUTURE) ×2 IMPLANT
SUT VIC AB 2-0 CT1 TAPERPNT 27 (SUTURE) ×2 IMPLANT
TOWEL GREEN STERILE FF (TOWEL DISPOSABLE) ×1 IMPLANT
TRAY FOLEY MTR SLVR 16FR STAT (SET/KITS/TRAYS/PACK) IMPLANT
TRAY FOLEY SLVR 14FR TEMP STAT (SET/KITS/TRAYS/PACK) IMPLANT
WATER STERILE IRR 1000ML POUR (IV SOLUTION) ×2 IMPLANT
YANKAUER SUCT BULB TIP NO VENT (SUCTIONS) ×1 IMPLANT

## 2023-05-13 NOTE — Anesthesia Postprocedure Evaluation (Signed)
 Anesthesia Post Note  Patient: Alaia Mulka  Procedure(s) Performed: LEFT TOTAL KNEE ARTHROPLASTY (Left: Knee)     Patient location during evaluation: PACU Anesthesia Type: Regional and Spinal Level of consciousness: awake Pain management: pain level controlled Vital Signs Assessment: post-procedure vital signs reviewed and stable Respiratory status: spontaneous breathing, nonlabored ventilation and respiratory function stable Cardiovascular status: blood pressure returned to baseline and stable Postop Assessment: no apparent nausea or vomiting Anesthetic complications: no   No notable events documented.  Last Vitals:  Vitals:   05/13/23 1330 05/13/23 1418  BP: (!) 152/89 120/75  Pulse: 60 61  Resp: 19 18  Temp: (!) 36.3 C 36.6 C  SpO2: 100% 100%    Last Pain:  Vitals:   05/13/23 1418  TempSrc: Oral  PainSc:                  Kolbee Bogusz P Bonnie Roig

## 2023-05-13 NOTE — Anesthesia Procedure Notes (Signed)
 Spinal  Patient location during procedure: OR Start time: 05/13/2023 9:55 AM End time: 05/13/2023 10:05 AM Reason for block: surgical anesthesia Staffing Performed: anesthesiologist  Anesthesiologist: Leonides Grills, MD Performed by: Leonides Grills, MD Authorized by: Leonides Grills, MD   Preanesthetic Checklist Completed: patient identified, IV checked, risks and benefits discussed, surgical consent, monitors and equipment checked, pre-op evaluation and timeout performed Spinal Block Patient position: sitting Prep: DuraPrep Patient monitoring: cardiac monitor, continuous pulse ox and blood pressure Approach: midline Location: L4-5 Injection technique: single-shot Needle Needle type: Pencan  Needle gauge: 24 G Needle length: 9 cm Assessment Sensory level: T10 Events: CSF return and second provider Additional Notes Functioning IV was confirmed and monitors were applied. Sterile prep and drape, including hand hygiene and sterile gloves were used. The patient was positioned and the spine was prepped. The skin was anesthetized with lidocaine.  Unsuccessful attempts by CRNA. Free flow of clear CSF was obtained prior to injecting local anesthetic into the CSF.  The spinal needle aspirated freely following injection.  The needle was carefully withdrawn.  The patient tolerated the procedure well.

## 2023-05-13 NOTE — Transfer of Care (Signed)
 Immediate Anesthesia Transfer of Care Note  Patient: Abigail Wright  Procedure(s) Performed: LEFT TOTAL KNEE ARTHROPLASTY (Left: Knee)  Patient Location: PACU  Anesthesia Type:MAC, Regional, and Spinal  Level of Consciousness: sedated  Airway & Oxygen Therapy: Patient Spontanous Breathing and Patient connected to face mask oxygen  Post-op Assessment: Report given to RN and Post -op Vital signs reviewed and stable  Post vital signs: Reviewed and stable  Last Vitals:  Vitals Value Taken Time  BP 133/97 05/13/23 1158  Temp    Pulse 79 05/13/23 1159  Resp 24 05/13/23 1159  SpO2 96 % 05/13/23 1159  Vitals shown include unfiled device data.  Last Pain:  Vitals:   05/13/23 0928  TempSrc:   PainSc: 0-No pain         Complications: No notable events documented.

## 2023-05-13 NOTE — Interval H&P Note (Signed)
 History and Physical Interval Note: Patient understands that she is here today for a left total knee replacement to treat her significant left knee pain and arthritis.  There has been no acute or interval change in her medical status.  The risks and benefits of surgery have been discussed in detail and informed consent has been obtained.  The left operative knee has been marked.  05/13/2023 8:45 AM  Abigail Wright  has presented today for surgery, with the diagnosis of osteoarthritis left knee.  The various methods of treatment have been discussed with the patient and family. After consideration of risks, benefits and other options for treatment, the patient has consented to  Procedure(s): LEFT TOTAL KNEE ARTHROPLASTY (Left) as a surgical intervention.  The patient's history has been reviewed, patient examined, no change in status, stable for surgery.  I have reviewed the patient's chart and labs.  Questions were answered to the patient's satisfaction.     Kathryne Hitch

## 2023-05-13 NOTE — Plan of Care (Signed)
  Problem: Education: Goal: Knowledge of General Education information will improve Description: Including pain rating scale, medication(s)/side effects and non-pharmacologic comfort measures Outcome: Progressing   Problem: Activity: Goal: Risk for activity intolerance will decrease Outcome: Progressing   Problem: Nutrition: Goal: Adequate nutrition will be maintained Outcome: Progressing   Problem: Elimination: Goal: Will not experience complications related to bowel motility Outcome: Progressing   Problem: Pain Managment: Goal: General experience of comfort will improve and/or be controlled Outcome: Progressing   Problem: Skin Integrity: Goal: Risk for impaired skin integrity will decrease Outcome: Progressing   Problem: Education: Goal: Knowledge of the prescribed therapeutic regimen will improve Outcome: Progressing   Problem: Activity: Goal: Ability to avoid complications of mobility impairment will improve Outcome: Progressing   Problem: Pain Management: Goal: Pain level will decrease with appropriate interventions Outcome: Progressing

## 2023-05-13 NOTE — Op Note (Signed)
 Operative Note  Date of operation: 05/13/2023 Preoperative diagnosis: Left knee primary osteoarthritis Postoperative diagnosis: Same  Procedure: Left press-fit total knee arthroplasty  Implants: Biomet/Zimmer persona press-fit knee system Implant Name Type Inv. Item Serial No. Manufacturer Lot No. LRB No. Used Action  COMP PATELLA 3 PEG 29X9 - [id number] Joint COMP PATELLA 3 PEG 29X9  ZIMMER [employer]ON(ORTH,TRAU,BIO,SG) [id number] Left 1 Implanted  COMP MED POLY AS PERS S6-7 12 - [id number] Joint COMP MED POLY AS PERS S6-7 12  ZIMMER RECON(ORTH,TRAU,BIO,SG) [id number] Left 1 Implanted  COMP FEM KNEE STD PS 7 LT - [id number] Joint COMP FEM KNEE STD PS 7 LT  ZIMMER RECON(ORTH,TRAU,BIO,SG) [id number] Left 1 Implanted  STEM TIB PS KNEE D 0D LT - [id number] Stem STEM TIB PS KNEE D 0D LT  ZIMMER RECON(ORTH,TRAU,BIO,SG) [id number] Left 1 Implanted   Surgeon: Vanita Panda. Magnus Ivan, MD Assistant: Rexene Edison, PA-C  Anesthesia: #1 left lower extremity adductor canal block, #2 spinal, #3 local Antibiotics: IV Ancef Tourniquet time: Under 1 hour EBL: Less than 50 cc Complications: None  Indications: The patient is a 58 year old female with debilitating arthritis involving her left knee.  She had a cartilage injury as a young person and had cartilage surgery.  She is someone who is morbidly obese but has been on a weight loss journey losing significant weight.  She has tried and failed conservative treatment for well over a year as it relates to her left knee arthritis.  Her x-ray shows significant arthritis in that left knee to the point where no other surgical intervention will be helpful other than a knee replacement.  We did discuss in length in detail the risks of acute blood loss anemia, nerve vessel injury, infection, DVT, implant failure and wound healing issues.  She understands that our goals are hopefully decreased pain, improved mobility and improved quality of life.  Procedure description: After  informed consent was obtained and appropriate left knee was marked anesthesia obtained a left lower extremity adductor canal block in the holding room.  The patient was then brought to the operating room and set up on the operating table where spinal anesthesia was obtained.  She was laid in supine position on the operating table and a Foley catheter was placed.  A nonsterile tractors placed around upper left thigh and her left thigh, knee, leg and ankle were prepped and draped in DuraPrep and sterile drapes including a sterile stockinette.  A timeout was called and she was notified is correct patient and the correct the left knee.  An Esmarch was used to wrap out the leg and the tourniquet inflated 300 mm of pressure.  With the knee extended a direct midline incision was made over the patella and carried proximally distally.  Dissection was carried down to the knee joint and a medial parapatellar arthrotomy was made finding a moderate joint effusion.  We did find significant osteophytes in all 3 compartments of the knee as well as significant cartilage wear especially at the patellofemoral joint and the medial compartment.  Remnants of the ACL and medial lateral meniscus were removed.  We then flexed the knee and used an extramedullary cutting guide for making her proximal tibia cut correction of varus and valgus and a 7 degree slope.  We made this cut to take 2 mm off the low side and we did back down to more millimeters.  We then used an intramedullary based cutting guide for distal femur cut for a left knee setting this at 5 degrees externally  rotated and a 10 mm distal femoral cut.  We made that cut without difficulty and brought the knee back down to full extension and with a 10 mm extension block at achieve full extension.  We then backed the femur and put a femoral sizing guide based off the epicondylar axis.  Based off of this we chose a size 7 femur.  We put a 4-in-1 cutting block for a size 7 femur and  made our anterior and posterior cuts followed by her chamfer cuts.  We then backed the tibia and chose a size D left tibial tray for coverage of the tibial plateau setting the rotation of the tibial tubercle and the femur.  Based off of this we did our drill hole and keel punch and had excellent quality bone for press-fit implants and this was appropriate given her young age of 8.  We then trialed our size D left tibia combined with our size 7 left CR standard femur.  We trialed a 10 mm thickness fixed-bearing medial congruent left polythene insert one of the 12 mm insert and we are pleased with range of motion and stability with a 12 mm insert.  We then made our patella cut and drilled 3 holes for a size 29 patella button.  Again with all trial instrumentation the knee we are pleased with range of motion and stability.  We then removed all trial components from the knee and irrigated the knee with normal saline solution and is a pulsatile lavage.  We then placed Marcaine with epinephrine around the arthrotomy.  Next we put the knee in a flexed position and dried the knee roll well.  We placed our Biomet/Zimmer persona press-fit tibial tray for a left knee size D followed by press fitting our size 7 left CR standard femur.  We placed a 12 mm medial congruent left polythene insert and press-fit our size 29 patella button.  Again we are pleased with range of motion and stability with his insurance.  We then let the tourniquet down and hemostasis was obtained with electrocautery.  The arthrotomy was then closed with interrupted #1 Vicryl suture followed by 0 Vicryl goes deep tissue and 2-0 Vicryl to close subcutaneous tissue.  The skin was closed with staples.  Well-padded sterile dressing was applied.  The patient was taken the recovery room in stable condition.  Rexene Edison, PA-C did assist during entire case and beginning to end and his assistance was crucial and medically necessary for soft tissue management and  retraction, helping guide implant placement and a layered closure of the wound.

## 2023-05-13 NOTE — Anesthesia Preprocedure Evaluation (Addendum)
 Anesthesia Evaluation  Patient identified by MRN, date of birth, ID band Patient awake    Reviewed: Allergy & Precautions, NPO status , Patient's Chart, lab work & pertinent test results  Airway Mallampati: II  TM Distance: >3 FB Neck ROM: Full    Dental no notable dental hx.    Pulmonary Patient abstained from smoking., former smoker   Pulmonary exam normal        Cardiovascular hypertension, Normal cardiovascular exam     Neuro/Psych  PSYCHIATRIC DISORDERS Anxiety Depression    negative neurological ROS     GI/Hepatic Neg liver ROS,GERD  Medicated and Controlled,,  Endo/Other  negative endocrine ROS    Renal/GU negative Renal ROS     Musculoskeletal  (+) Arthritis ,    Abdominal  (+) + obese  Peds  Hematology negative hematology ROS (+)   Anesthesia Other Findings osteoarthritis left knee  Reproductive/Obstetrics                             Anesthesia Physical Anesthesia Plan  ASA: 2  Anesthesia Plan: Regional and Spinal   Post-op Pain Management: Regional block*   Induction:   PONV Risk Score and Plan: 2 and Ondansetron, Dexamethasone, Propofol infusion, Midazolam and Treatment may vary due to age or medical condition  Airway Management Planned: Simple Face Mask  Additional Equipment:   Intra-op Plan:   Post-operative Plan:   Informed Consent: I have reviewed the patients History and Physical, chart, labs and discussed the procedure including the risks, benefits and alternatives for the proposed anesthesia with the patient or authorized representative who has indicated his/her understanding and acceptance.     Dental advisory given  Plan Discussed with: CRNA  Anesthesia Plan Comments:        Anesthesia Quick Evaluation

## 2023-05-13 NOTE — Evaluation (Signed)
 Physical Therapy Evaluation Patient Details Name: Abigail Wright MRN: [id number] DOB: [birth year] Today's Date: 05/13/2023  History of Present Illness  Pt s/p L TKR and with hx of ADD  Clinical Impression  Pt s/p L TKR and presents with decreased L LE strength/ROM and post op pain limiting functional mobility.  Pt should progress to dc home with family assist.        If plan is discharge home, recommend the following: A little help with walking and/or transfers;A little help with bathing/dressing/bathroom;Assistance with cooking/housework;Assist for transportation;Help with stairs or ramp for entrance   Can travel by private vehicle        Equipment Recommendations None recommended by PT  Recommendations for Other Services       Functional Status Assessment Patient has had a recent decline in their functional status and demonstrates the ability to make significant improvements in function in a reasonable and predictable amount of time.     Precautions / Restrictions Precautions Precautions: Fall;Knee Required Braces or Orthoses: Knee Immobilizer - Left Knee Immobilizer - Left: Discontinue once straight leg raise with < 10 degree lag Restrictions Weight Bearing Restrictions Per Provider Order: Yes LLE Weight Bearing Per Provider Order: Weight bearing as tolerated      Mobility  Bed Mobility Overal bed mobility: Needs Assistance Bed Mobility: Supine to Sit     Supine to sit: Min assist     General bed mobility comments: cues for sequence and use of R LE to self assist    Transfers Overall transfer level: Needs assistance Equipment used: Rolling walker (2 wheels) Transfers: Sit to/from Stand Sit to Stand: Min assist           General transfer comment: cues for LE management and use of UEs to self assist    Ambulation/Gait Ambulation/Gait assistance: Min assist, Contact guard assist Gait Distance (Feet): 44 Feet Assistive device: Rolling walker (2  wheels) Gait Pattern/deviations: Step-to pattern, Decreased step length - right, Decreased step length - left, Shuffle, Trunk flexed Gait velocity: decr     General Gait Details: cues for sequence, posture and position from [employer]            Wheelchair Mobility     Tilt Bed    Modified Rankin (Stroke Patients Only)       Balance Overall balance assessment: Mild deficits observed, not formally tested                                           Pertinent Vitals/Pain Pain Assessment Pain Assessment: 0-10 Pain Score: 5  Pain Location: L knee Pain Descriptors / Indicators: Aching, Sore Pain Intervention(s): Limited activity within patient's tolerance, Monitored during session, Premedicated before session, Ice applied    Home Living Family/patient expects to be discharged to:: Private residence Living Arrangements: Spouse/significant other Available Help at Discharge: Family Type of Home: House Home Access: Stairs to enter Entrance Stairs-Rails: Right Entrance Stairs-Number of Steps: 3   Home Layout: One level Home Equipment: Agricultural consultant (2 wheels);Cane - single point;Rollator (4 wheels)      Prior Function Prior Level of Function : Independent/Modified Independent                     Extremity/Trunk Assessment   Upper Extremity Assessment Upper Extremity Assessment: Overall WFL for tasks assessed    Lower Extremity Assessment Lower Extremity Assessment:  LLE deficits/detail    Cervical / Trunk Assessment Cervical / Trunk Assessment: Normal  Communication   Communication Communication: No apparent difficulties    Cognition Arousal: Alert Behavior During Therapy: WFL for tasks assessed/performed   PT - Cognitive impairments: No apparent impairments                         Following commands: Intact       Cueing Cueing Techniques: Verbal cues     General Comments      Exercises Total Joint  Exercises Ankle Circles/Pumps: AROM, Both, 15 reps, Supine   Assessment/Plan    PT Assessment Patient needs continued PT services  PT Problem List Decreased strength;Decreased range of motion;Decreased activity tolerance;Decreased balance;Decreased mobility;Decreased knowledge of use of DME;Pain       PT Treatment Interventions DME instruction;Gait training;Stair training;Functional mobility training;Therapeutic activities;Therapeutic exercise;Patient/family education    PT Goals (Current goals can be found in the Care Plan section)  Acute Rehab PT Goals Patient Stated Goal: Regain IND PT Goal Formulation: With patient Time For Goal Achievement: 05/20/23 Potential to Achieve Goals: Good    Frequency 7X/week     Co-evaluation               AM-PAC PT "6 Clicks" Mobility  Outcome Measure Help needed turning from your back to your side while in a flat bed without using bedrails?: A Little Help needed moving from lying on your back to sitting on the side of a flat bed without using bedrails?: A Little Help needed moving to and from a bed to a chair (including a wheelchair)?: A Little Help needed standing up from a chair using your arms (e.g., wheelchair or bedside chair)?: A Little Help needed to walk in hospital room?: A Little Help needed climbing 3-5 steps with a railing? : A Lot 6 Click Score: 17    End of Session Equipment Utilized During Treatment: Gait belt;Left knee immobilizer Activity Tolerance: Patient tolerated treatment well Patient left: in chair;with call bell/phone within reach;with chair alarm set;with family/visitor present Nurse Communication: Mobility status PT Visit Diagnosis: Difficulty in walking, not elsewhere classified (R26.2)    Time: 0865-7846 PT Time Calculation (min) (ACUTE ONLY): 20 min   Charges:   PT Evaluation $PT Eval Low Complexity: 1 Low   PT General Charges $$ ACUTE PT VISIT: 1 Visit         Mauro Kaufmann PT Acute  Rehabilitation Services Pager [phone] Office [phone]   Ajanae Virag 05/13/2023, 5:25 PM

## 2023-05-13 NOTE — Anesthesia Procedure Notes (Signed)
 Anesthesia Regional Block: Adductor canal block   Pre-Anesthetic Checklist: , timeout performed,  Correct Patient, Correct Site, Correct Laterality,  Correct Procedure,, site marked,  Risks and benefits discussed,  Surgical consent,  Pre-op evaluation,  At surgeon's request and post-op pain management  Laterality: Left  Prep: chloraprep       Needles:  Injection technique: Single-shot  Needle Type: Echogenic Stimulator Needle     Needle Length: 10cm  Needle Gauge: 20     Additional Needles:   Procedures:,,,, ultrasound used (permanent image in chart),,    Narrative:  Start time: 05/13/2023 9:20 AM End time: 05/13/2023 9:30 AM Injection made incrementally with aspirations every 5 mL.  Performed by: Personally  Anesthesiologist: Leonides Grills, MD  Additional Notes: Functioning IV was confirmed and monitors were applied. A time-out was performed. Hand hygiene and sterile gloves were used. The thigh was placed in a frog-leg position and prepped in a sterile fashion. A 20ga Bbraun echogenic stimulator needle was placed using ultrasound guidance.  Negative aspiration and negative test dose prior to incremental administration of local anesthetic. The patient tolerated the procedure well.

## 2023-05-14 DIAGNOSIS — M1712 Unilateral primary osteoarthritis, left knee: Secondary | ICD-10-CM | POA: Diagnosis not present

## 2023-05-14 LAB — BASIC METABOLIC PANEL
Anion gap: 6 (ref 5–15)
BUN: 17 mg/dL (ref 6–20)
CO2: 28 mmol/L (ref 22–32)
Calcium: 8.9 mg/dL (ref 8.9–10.3)
Chloride: 105 mmol/L (ref 98–111)
Creatinine, Ser: 0.77 mg/dL (ref 0.44–1.00)
GFR, Estimated: >60 mL/min (ref >60)
Glucose, Bld: 137 mg/dL — ABNORMAL HIGH (ref 70–99)
Potassium: 4.1 mmol/L (ref 3.5–5.1)
Sodium: 139 mmol/L (ref 135–145)

## 2023-05-14 LAB — CBC
HCT: 41 % (ref 36.0–46.0)
Hemoglobin: 12.7 g/dL (ref 12.0–15.0)
MCH: 28.3 pg (ref 26.0–34.0)
MCHC: 31 g/dL (ref 30.0–36.0)
MCV: 91.5 fL (ref 80.0–100.0)
Platelets: 218 10*3/uL (ref 150–400)
RBC: 4.48 MIL/uL (ref 3.87–5.11)
RDW: 14.1 % (ref 11.5–15.5)
WBC: 8.8 10*3/uL (ref 4.0–10.5)
nRBC: 0 % (ref 0.0–0.2)

## 2023-05-14 MED ORDER — TIZANIDINE HCL 4 MG PO TABS: 4.0000 mg | ORAL_TABLET | Freq: Four times a day (QID) | ORAL | 0 refills | Status: AC | PRN

## 2023-05-14 MED ORDER — KETOROLAC TROMETHAMINE 10 MG PO TABS: 10.0000 mg | ORAL_TABLET | Freq: Four times a day (QID) | ORAL | 0 refills | Status: DC | PRN

## 2023-05-14 MED ORDER — OXYCODONE HCL 5 MG PO TABS
5.0000 mg | ORAL_TABLET | Freq: Four times a day (QID) | ORAL | 0 refills | Status: DC | PRN
Start: 2023-05-14 — End: 2023-05-23

## 2023-05-14 MED ORDER — ASPIRIN 81 MG PO CHEW: 81.0000 mg | CHEWABLE_TABLET | Freq: Two times a day (BID) | ORAL | 0 refills | Status: DC

## 2023-05-14 NOTE — Progress Notes (Signed)
 AVS reviewed with patient & husband. Both verbalized an understanding- No other questions at this time. Pt dressed for d/c to home. PIV removed as noted -pt to lobby via w/c & home w/ husband. Pt has walker in car

## 2023-05-14 NOTE — Discharge Instructions (Signed)
 Per [facility] clinic policy, our goal is ensure optimal postoperative pain control with a multimodal pain management strategy. For all [facility] patients, our goal is to wean post-operative narcotic medications by 6 weeks post-operatively. If this is not possible due to utilization of pain medication prior to surgery, your [facility] doctor will support your acute post-operative pain control for the first 6 weeks postoperatively, with a plan to transition you back to your primary pain team following that. Abigail Wright will work to ensure a Therapist, occupational.  INSTRUCTIONS AFTER JOINT REPLACEMENT   Remove items at home which could result in a fall. This includes throw rugs or furniture in walking pathways ICE to the affected joint every three hours while awake for 30 minutes at a time, for at least the first 3-5 days, and then as needed for pain and swelling.  Continue to use ice for pain and swelling. You may notice swelling that will progress down to the foot and ankle.  This is normal after surgery.  Elevate your leg when you are not up walking on it.   Continue to use the breathing machine you got in the hospital (incentive spirometer) which will help keep your temperature down.  It is common for your temperature to cycle up and down following surgery, especially at night when you are not up moving around and exerting yourself.  The breathing machine keeps your lungs expanded and your temperature down.   DIET:  As you were doing prior to hospitalization, we recommend a well-balanced diet.  DRESSING / WOUND CARE / SHOWERING  Keep the surgical dressing until follow up.  The dressing is water proof, so you can shower without any extra covering.  IF THE DRESSING FALLS OFF or the wound gets wet inside, change the dressing with sterile gauze.  Please use good hand washing techniques before changing the dressing.  Do not use any lotions or creams on the incision until instructed by your surgeon.     ACTIVITY  Increase activity slowly as tolerated, but follow the weight bearing instructions below.   No driving for 6 weeks or until further direction given by your physician.  You cannot drive while taking narcotics.  No lifting or carrying greater than 10 lbs. until further directed by your surgeon. Avoid periods of inactivity such as sitting longer than an hour when not asleep. This helps prevent blood clots.  You may return to work once you are authorized by your doctor.     WEIGHT BEARING   Weight bearing as tolerated with assist device (walker, cane, etc) as directed, use it as long as suggested by your surgeon or therapist, typically at least 4-6 weeks.   EXERCISES  Results after joint replacement surgery are often greatly improved when you follow the exercise, range of motion and muscle strengthening exercises prescribed by your doctor. Safety measures are also important to protect the joint from further injury. Any time any of these exercises cause you to have increased pain or swelling, decrease what you are doing until you are comfortable again and then slowly increase them. If you have problems or questions, call your caregiver or physical therapist for advice.   Rehabilitation is important following a joint replacement. After just a few days of immobilization, the muscles of the leg can become weakened and shrink (atrophy).  These exercises are designed to build up the tone and strength of the thigh and leg muscles and to improve motion. Often times heat used for twenty to thirty minutes before  working out will loosen up your tissues and help with improving the range of motion but do not use heat for the first two weeks following surgery (sometimes heat can increase post-operative swelling).   These exercises can be done on a training (exercise) mat, on the floor, on a table or on a bed. Use whatever works the best and is most comfortable for you.    Use music or television  while you are exercising so that the exercises are a pleasant break in your day. This will make your life better with the exercises acting as a break in your routine that you can look forward to.   Perform all exercises about fifteen times, three times per day or as directed.  You should exercise both the operative leg and the other leg as well.  Exercises include:   Quad Sets - Tighten up the muscle on the front of the thigh (Quad) and hold for 5-10 seconds.   Straight Leg Raises - With your knee straight (if you were given a brace, keep it on), lift the leg to 60 degrees, hold for 3 seconds, and slowly lower the leg.  Perform this exercise against resistance later as your leg gets stronger.  Leg Slides: Lying on your back, slowly slide your foot toward your buttocks, bending your knee up off the floor (only go as far as is comfortable). Then slowly slide your foot back down until your leg is flat on the floor again.  Angel Wings: Lying on your back spread your legs to the side as far apart as you can without causing discomfort.  Hamstring Strength:  Lying on your back, push your heel against the floor with your leg straight by tightening up the muscles of your buttocks.  Repeat, but this time bend your knee to a comfortable angle, and push your heel against the floor.  You may put a pillow under the heel to make it more comfortable if necessary.   A rehabilitation program following joint replacement surgery can speed recovery and prevent re-injury in the future due to weakened muscles. Contact your doctor or a physical therapist for more information on knee rehabilitation.    CONSTIPATION  Constipation is defined medically as fewer than three stools per week and severe constipation as less than one stool per week.  Even if you have a regular bowel pattern at home, your normal regimen is likely to be disrupted due to multiple reasons following surgery.  Combination of anesthesia, postoperative  narcotics, change in appetite and fluid intake all can affect your bowels.   YOU MUST use at least one of the following options; they are listed in order of increasing strength to get the job done.  They are all available over the counter, and you may need to use some, POSSIBLY even all of these options:    Drink plenty of fluids (prune juice may be helpful) and high fiber foods Colace 100 mg by mouth twice a day  Senokot for constipation as directed and as needed Dulcolax (bisacodyl), take with full glass of water  Miralax (polyethylene glycol) once or twice a day as needed.  If you have tried all these things and are unable to have a bowel movement in the first 3-4 days after surgery call either your surgeon or your primary doctor.    If you experience loose stools or diarrhea, hold the medications until you stool forms back up.  If your symptoms do not get better within 1 week  or if they get worse, check with your doctor.  If you experience "the worst abdominal pain ever" or develop nausea or vomiting, please contact the office immediately for further recommendations for treatment.   ITCHING:  If you experience itching with your medications, try taking only a single pain pill, or even half a pain pill at a time.  You can also use Benadryl over the counter for itching or also to help with sleep.   TED HOSE STOCKINGS:  Use stockings on both legs until for at least 2 weeks or as directed by physician office. They may be removed at night for sleeping.  MEDICATIONS:  See your medication summary on the "After Visit Summary" that nursing will review with you.  You may have some home medications which will be placed on hold until you complete the course of blood thinner medication.  It is important for you to complete the blood thinner medication as prescribed.  PRECAUTIONS:  If you experience chest pain or shortness of breath - call 911 immediately for transfer to the hospital emergency department.    If you develop a fever greater that 101 F, purulent drainage from wound, increased redness or drainage from wound, foul odor from the wound/dressing, or calf pain - CONTACT YOUR SURGEON.                                                   FOLLOW-UP APPOINTMENTS:  If you do not already have a post-op appointment, please call the office for an appointment to be seen by your surgeon.  Guidelines for how soon to be seen are listed in your "After Visit Summary", but are typically between 1-4 weeks after surgery.  OTHER INSTRUCTIONS:   Knee Replacement:  Do not place pillow under knee, focus on keeping the knee straight while resting. CPM instructions: 0-90 degrees, 2 hours in the morning, 2 hours in the afternoon, and 2 hours in the evening. Place foam block, curve side up under heel at all times except when in CPM or when walking.  DO NOT modify, tear, cut, or change the foam block in any way.  POST-OPERATIVE OPIOID TAPER INSTRUCTIONS: It is important to wean off of your opioid medication as soon as possible. If you do not need pain medication after your surgery it is ok to stop day one. Opioids include: Codeine, Hydrocodone(Norco, Vicodin), Oxycodone(Percocet, oxycontin) and hydromorphone amongst others.  Long term and even short term use of opiods can cause: Increased pain response Dependence Constipation Depression Respiratory depression And more.  Withdrawal symptoms can include Flu like symptoms Nausea, vomiting And more Techniques to manage these symptoms Hydrate well Eat regular healthy meals Stay active Use relaxation techniques(deep breathing, meditating, yoga) Do Not substitute Alcohol to help with tapering If you have been on opioids for less than two weeks and do not have pain than it is ok to stop all together.  Plan to wean off of opioids This plan should start within one week post op of your joint replacement. Maintain the same interval or time between taking each dose  and first decrease the dose.  Cut the total daily intake of opioids by one tablet each day Next start to increase the time between doses. The last dose that should be eliminated is the evening dose.   MAKE SURE YOU:  Understand these instructions.  Get help right away if you are not doing well or get worse.    Thank you for letting us be a part of your medical care team.  It is a privilege we respect greatly.  We hope these instructions will help you stay on track for a fast and full recovery!      Dental Antibiotics:  In most cases prophylactic antibiotics for Dental procdeures after total joint surgery are not necessary.  Exceptions are as follows:  1. History of prior total joint infection  2. Severely immunocompromised (Organ Transplant, cancer chemotherapy, Rheumatoid biologic meds such as Humera)  3. Poorly controlled diabetes (A1C &gt; 8.0, blood glucose over 200)  If you have one of these conditions, contact your surgeon for an antibiotic prescription, prior to your dental procedure.

## 2023-05-14 NOTE — Plan of Care (Signed)
  Problem: Coping: Goal: Level of anxiety will decrease Outcome: Progressing   Problem: Pain Managment: Goal: General experience of comfort will improve and/or be controlled Outcome: Progressing   Problem: Safety: Goal: Ability to remain free from injury will improve Outcome: Progressing   Problem: Activity: Goal: Ability to avoid complications of mobility impairment will improve Outcome: Progressing

## 2023-05-14 NOTE — Progress Notes (Signed)
 Physical Therapy Treatment Patient Details Name: Abigail Wright MRN: [id number] DOB: 08/30/1965 Today's Date: 05/14/2023   History of Present Illness Pt s/p L TKR and with hx of ADD    PT Comments  Pt motivated and progressing well.  Pt performed HEP with written instruction provided and reviewed.  Pt up to ambulate in hall, negotiated stairs, reviewed don/doff KI, and reviewed dressing sequence.  Pt eager for dc home this date.    If plan is discharge home, recommend the following: A little help with walking and/or transfers;A little help with bathing/dressing/bathroom;Assistance with cooking/housework;Assist for transportation;Help with stairs or ramp for entrance   Can travel by private vehicle        Equipment Recommendations  None recommended by PT    Recommendations for Other Services       Precautions / Restrictions Precautions Precautions: Fall;Knee Required Braces or Orthoses: Knee Immobilizer - Left Knee Immobilizer - Left: Discontinue once straight leg raise with < 10 degree lag Restrictions Weight Bearing Restrictions Per Provider Order: Yes LLE Weight Bearing Per Provider Order: Weight bearing as tolerated     Mobility  Bed Mobility Overal bed mobility: Needs Assistance Bed Mobility: Supine to Sit     Supine to sit: Supervision     General bed mobility comments: cues for sequence and use of gait belt to self assist    Transfers Overall transfer level: Needs assistance Equipment used: Rolling walker (2 wheels) Transfers: Sit to/from Stand Sit to Stand: Contact guard assist, Supervision           General transfer comment: cues for LE management and use of UEs to self assist    Ambulation/Gait Ambulation/Gait assistance: Contact guard assist, Supervision Gait Distance (Feet): 100 Feet Assistive device: Rolling walker (2 wheels) Gait Pattern/deviations: Step-to pattern, Decreased step length - right, Decreased step length - left, Shuffle, Trunk  flexed Gait velocity: decr     General Gait Details: cues for sequence, posture and position from RW   Stairs Stairs: Yes Stairs assistance: Min assist Stair Management: One rail Right, Step to pattern, Forwards, With cane Number of Stairs: 3 General stair comments: cues for sequence and foot/cane placement   Wheelchair Mobility     Tilt Bed    Modified Rankin (Stroke Patients Only)       Balance Overall balance assessment: Mild deficits observed, not formally tested                                          Communication Communication Communication: No apparent difficulties  Cognition Arousal: Alert Behavior During Therapy: WFL for tasks assessed/performed   PT - Cognitive impairments: No apparent impairments                         Following commands: Intact      Cueing Cueing Techniques: Verbal cues  Exercises Total Joint Exercises Ankle Circles/Pumps: AROM, Both, 15 reps, Supine Quad Sets: AROM, Both, 10 reps, Supine Heel Slides: AAROM, Left, 15 reps, Supine Hip ABduction/ADduction: AAROM, Left, 10 reps, Supine Straight Leg Raises: AAROM, Left, 15 reps, Supine    General Comments        Pertinent Vitals/Pain Pain Assessment Pain Assessment: 0-10 Pain Score: 5  Pain Location: L knee Pain Descriptors / Indicators: Aching, Sore Pain Intervention(s): Limited activity within patient's tolerance, Monitored during session, Premedicated before session, Ice applied  Home Living                          Prior Function            PT Goals (current goals can now be found in the care plan section) Acute Rehab PT Goals Patient Stated Goal: Regain IND PT Goal Formulation: With patient Time For Goal Achievement: 05/20/23 Potential to Achieve Goals: Good Progress towards PT goals: Progressing toward goals    Frequency    7X/week      PT Plan      Co-evaluation              AM-PAC PT "6 Clicks"  Mobility   Outcome Measure  Help needed turning from your back to your side while in a flat bed without using bedrails?: A Little Help needed moving from lying on your back to sitting on the side of a flat bed without using bedrails?: A Little Help needed moving to and from a bed to a chair (including a wheelchair)?: A Little Help needed standing up from a chair using your arms (e.g., wheelchair or bedside chair)?: A Little Help needed to walk in hospital room?: A Little Help needed climbing 3-5 steps with a railing? : A Little 6 Click Score: 18    End of Session Equipment Utilized During Treatment: Gait belt;Left knee immobilizer Activity Tolerance: Patient tolerated treatment well Patient left: in chair;with call bell/phone within reach;with chair alarm set;with family/visitor present Nurse Communication: Mobility status PT Visit Diagnosis: Difficulty in walking, not elsewhere classified (R26.2)     Time: 1016-1100 PT Time Calculation (min) (ACUTE ONLY): 44 min  Charges:    $Gait Training: 8-22 mins $Therapeutic Exercise: 8-22 mins $Therapeutic Activity: 8-22 mins PT General Charges $$ ACUTE PT VISIT: 1 Visit                     Mauro Kaufmann PT Acute Rehabilitation Services Pager [phone] Office [phone]    Alejandria Wessells 05/14/2023, 2:09 PM

## 2023-05-14 NOTE — Discharge Summary (Signed)
 Patient ID: Abigail Wright MRN: [id number] DOB/AGE: 05-19-1965 58 y.o.  Admit date: 05/13/2023 Discharge date: 05/14/2023  Admission Diagnoses:  Principal Problem:   Unilateral primary osteoarthritis, left knee Active Problems:   Status post total left knee replacement   Discharge Diagnoses:  Same  Past Medical History:  Diagnosis Date   ADD (attention deficit disorder)    Anxiety    Arthritis    Blood transfusion without reported diagnosis    1993   Depression    GERD (gastroesophageal reflux disease)    in past but recently controlled with diet and exercise.   Hyperlipidemia    Hypertension    Pneumonia     Surgeries: Procedure(s): LEFT TOTAL KNEE ARTHROPLASTY on 05/13/2023   Consultants:   Discharged Condition: Improved  Hospital Course: Abigail Wright is an 58 y.o. female who was admitted 05/13/2023 for operative treatment ofUnilateral primary osteoarthritis, left knee. Patient has severe unremitting pain that affects sleep, daily activities, and work/hobbies. After pre-op clearance the patient was taken to the operating room on 05/13/2023 and underwent  Procedure(s): LEFT TOTAL KNEE ARTHROPLASTY.    Patient was given perioperative antibiotics:  Anti-infectives (From admission, onward)    Start     Dose/Rate Route Frequency Ordered Stop   05/13/23 1600  ceFAZolin (ANCEF) IVPB 2g/100 mL premix        2 g 200 mL/hr over 30 Minutes Intravenous Every 6 hours 05/13/23 1423 05/13/23 2209   05/13/23 0730  ceFAZolin (ANCEF) IVPB 2g/100 mL premix        2 g 200 mL/hr over 30 Minutes Intravenous On call to O.R. 05/13/23 0728 05/13/23 1006        Patient was given sequential compression devices, early ambulation, and chemoprophylaxis to prevent DVT.  Patient benefited maximally from hospital stay and there were no complications.    Recent vital signs: Patient Vitals for the past 24 hrs:  BP Temp Temp src Pulse Resp SpO2  05/14/23 1006 110/70 99.4 F (37.4 C) Oral 87  16 98 %  05/14/23 0548 (!) 119/95 98.3 F (36.8 C) -- 88 16 96 %  05/14/23 0116 (!) 101/55 98.6 F (37 C) Oral 84 16 93 %  05/13/23 2048 (!) 92/56 98.2 F (36.8 C) Oral 72 16 99 %  05/13/23 1758 105/83 98.4 F (36.9 C) -- 73 16 100 %  05/13/23 1615 103/72 98.4 F (36.9 C) Oral 73 16 100 %  05/13/23 1418 120/75 97.9 F (36.6 C) Oral 61 18 100 %  05/13/23 1330 (!) 152/89 (!) 97.4 F (36.3 C) -- 60 19 100 %  05/13/23 1315 (!) 162/93 -- -- 61 16 100 %  05/13/23 1300 133/68 -- -- 71 19 99 %  05/13/23 1245 (!) 126/94 -- -- 62 17 100 %  05/13/23 1230 115/80 -- -- 67 18 100 %  05/13/23 1215 130/81 -- -- 81 20 100 %  05/13/23 1200 129/86 -- -- 86 (!) 27 97 %  05/13/23 1158 (!) 133/97 97.6 F (36.4 C) -- 78 18 96 %     Recent laboratory studies:  Recent Labs    05/14/23 0339  WBC 8.8  HGB 12.7  HCT 41.0  PLT 218  NA 139  K 4.1  CL 105  CO2 28  BUN 17  CREATININE 0.77  GLUCOSE 137*  CALCIUM 8.9     Discharge Medications:   Allergies as of 05/14/2023   No Known Allergies      Medication List     STOP  taking these medications    aspirin EC 81 MG tablet Replaced by: aspirin 81 MG chewable tablet       TAKE these medications    amphetamine-dextroamphetamine 30 MG tablet Commonly known as: Adderall Take 1 tablet by mouth 2 (two) times daily. What changed: when to take this   aspirin 81 MG chewable tablet Chew 1 tablet (81 mg total) by mouth 2 (two) times daily. Replaces: aspirin EC 81 MG tablet   atorvastatin 40 MG tablet Commonly known as: LIPITOR Take 1 tablet (40 mg total) by mouth daily.   buPROPion 300 MG 24 hr tablet Commonly known as: WELLBUTRIN XL Take 1 tablet (300 mg total) by mouth daily.   citalopram 20 MG tablet Commonly known as: CELEXA TAKE 1 TABLET BY MOUTH EACH DAY What changed:  how much to take how to take this when to take this additional instructions   clonazePAM 0.5 MG tablet Commonly known as: KLONOPIN Take 1 tablet (0.5  mg total) by mouth at bedtime.   diclofenac 75 MG EC tablet Commonly known as: VOLTAREN Take 75 mg by mouth 2 (two) times daily.   fluticasone 50 MCG/ACT nasal spray Commonly known as: FLONASE Place 1 spray into both nostrils in the morning.   HAIR SKIN NAILS GUMMIES PO Take 2 tablets by mouth in the morning.   ketorolac 10 MG tablet Commonly known as: TORADOL Take 1 tablet (10 mg total) by mouth every 6 (six) hours as needed.   Loratadine 10 MG Caps Take 10 mg by mouth daily.   METAMUCIL PO Take 3 tablets by mouth in the morning. Metamucil Fiber Gummies Plus Probiotics for Digestive Health   multivitamin with minerals Tabs tablet Take 1 tablet by mouth in the morning. One A Day for Women 50+   omeprazole 20 MG tablet Commonly known as: PRILOSEC OTC Take 20 mg by mouth daily before breakfast.   oxyCODONE 5 MG immediate release tablet Commonly known as: Oxy IR/ROXICODONE Take 1-2 tablets (5-10 mg total) by mouth every 6 (six) hours as needed for moderate pain (pain score 4-6) (pain score 4-6).   Refresh Tears 0.5 % Soln Generic drug: carboxymethylcellulose Place 1 drop into both eyes in the morning.   tiZANidine 4 MG tablet Commonly known as: ZANAFLEX Take 1 tablet (4 mg total) by mouth every 6 (six) hours as needed for muscle spasms.               Durable Medical Equipment  (From admission, onward)           Start     Ordered   05/13/23 1424  DME 3 n 1  Once        05/13/23 1423   05/13/23 1424  DME Walker rolling  Once       Question Answer Comment  Walker: With 5 Inch Wheels   Patient needs a walker to treat with the following condition Status post total left knee replacement      05/13/23 1423            Diagnostic Studies: DG Knee Left Port Result Date: 05/13/2023 CLINICAL DATA:  Status post knee replacement. EXAM: PORTABLE LEFT KNEE - 1-2 VIEW COMPARISON:  None Available. FINDINGS: Left knee arthroplasty in expected alignment. No  periprosthetic lucency or fracture. Recent postsurgical change includes air and edema in the soft tissues and joint space. Anterior skin staples in place. IMPRESSION: Left knee arthroplasty without immediate postoperative complication. Electronically Signed   By: Narda Rutherford  M.D.   On: 05/13/2023 14:48    Disposition: Discharge disposition: 01-Home or Self Care          Follow-up Information     Kathryne Hitch, MD Follow up in 2 week(s).   Specialty: Orthopedic Surgery Contact information: [address] [place] [address] [phone]                  Signed: Kathryne Hitch 05/14/2023, 11:21 AM

## 2023-05-14 NOTE — Progress Notes (Signed)
 Subjective: 1 Day Post-Op Procedure(s) (LRB): LEFT TOTAL KNEE ARTHROPLASTY (Left) Patient reports pain as moderate.    Objective: Vital signs in last 24 hours: Temp:  [97.4 F (36.3 C)-99.4 F (37.4 C)] 99.4 F (37.4 C) (03/22 1006) Pulse Rate:  [60-88] 87 (03/22 1006) Resp:  [16-27] 16 (03/22 1006) BP: (92-162)/(55-97) 110/70 (03/22 1006) SpO2:  [93 %-100 %] 98 % (03/22 1006)  Intake/Output from previous day: 03/21 0701 - 03/22 0700 In: 3040 [P.O.:840; I.V.:2100; IV Piggyback:100] Out: 2440 [Urine:2390; Blood:50] Intake/Output this shift: Total I/O In: 240 [P.O.:240] Out: -   Recent Labs    05/14/23 0339  HGB 12.7   Recent Labs    05/14/23 0339  WBC 8.8  RBC 4.48  HCT 41.0  PLT 218   Recent Labs    05/14/23 0339  NA 139  K 4.1  CL 105  CO2 28  BUN 17  CREATININE 0.77  GLUCOSE 137*  CALCIUM 8.9   No results for input(s): "LABPT", "INR" in the last 72 hours.  Sensation intact distally Intact pulses distally Dorsiflexion/Plantar flexion intact Incision: dressing C/D/I Compartment soft   Assessment/Plan: 1 Day Post-Op Procedure(s) (LRB): LEFT TOTAL KNEE ARTHROPLASTY (Left) Up with therapy Discharge home with home health      Kathryne Hitch 05/14/2023, 11:20 AM

## 2023-05-16 ENCOUNTER — Encounter: Payer: Self-pay | Admitting: Orthopaedic Surgery

## 2023-05-16 ENCOUNTER — Telehealth: Payer: Self-pay | Admitting: Orthopaedic Surgery

## 2023-05-16 ENCOUNTER — Encounter (HOSPITAL_COMMUNITY): Payer: Self-pay | Admitting: Orthopaedic Surgery

## 2023-05-16 ENCOUNTER — Telehealth: Payer: Self-pay | Admitting: *Deleted

## 2023-05-16 NOTE — Telephone Encounter (Signed)
 Patient called and said that her insurance didn't cover the place you requested. CB#[phone]

## 2023-05-16 NOTE — Telephone Encounter (Signed)
 RNCM did speak with patient at hospital on Friday after her surgery and discussed cost of [facility] visits per OPPT visits and she agreed going to OP would be better for her. Spoke with [facility] today and they do have availability this Thursday. Order faxed to them and they will contact patient. CM also contacted patient and gave number/address to facility. Reminded to continue with exercises given by hospital therapist.

## 2023-05-17 ENCOUNTER — Telehealth: Payer: Self-pay | Admitting: *Deleted

## 2023-05-17 ENCOUNTER — Other Ambulatory Visit: Payer: Self-pay | Admitting: *Deleted

## 2023-05-17 DIAGNOSIS — Z96652 Presence of left artificial knee joint: Secondary | ICD-10-CM

## 2023-05-17 DIAGNOSIS — M1712 Unilateral primary osteoarthritis, left knee: Secondary | ICD-10-CM

## 2023-05-17 NOTE — Telephone Encounter (Signed)
 RNCM was able to get patient scheduled with [facility] for Rehab, but then found out they are OON with her insurance. Referral made to [facility] location and they have her scheduled to begin OPPT on 05/18/23 at 1:45 pm. Patient is aware and agreeable.

## 2023-05-17 NOTE — Telephone Encounter (Signed)
 Ralph Dowdy is working on a facility that takes her insurance

## 2023-05-18 ENCOUNTER — Other Ambulatory Visit: Payer: Self-pay

## 2023-05-18 ENCOUNTER — Ambulatory Visit: Payer: PRIVATE HEALTH INSURANCE | Attending: Orthopaedic Surgery | Admitting: Physical Therapy

## 2023-05-18 ENCOUNTER — Encounter: Payer: Self-pay | Admitting: Orthopaedic Surgery

## 2023-05-18 DIAGNOSIS — Z96652 Presence of left artificial knee joint: Secondary | ICD-10-CM | POA: Diagnosis not present

## 2023-05-18 DIAGNOSIS — R600 Localized edema: Secondary | ICD-10-CM

## 2023-05-18 DIAGNOSIS — R262 Difficulty in walking, not elsewhere classified: Secondary | ICD-10-CM

## 2023-05-18 DIAGNOSIS — M6281 Muscle weakness (generalized): Secondary | ICD-10-CM | POA: Diagnosis present

## 2023-05-18 DIAGNOSIS — M25562 Pain in left knee: Secondary | ICD-10-CM

## 2023-05-18 DIAGNOSIS — M25662 Stiffness of left knee, not elsewhere classified: Secondary | ICD-10-CM

## 2023-05-18 DIAGNOSIS — M1712 Unilateral primary osteoarthritis, left knee: Secondary | ICD-10-CM | POA: Insufficient documentation

## 2023-05-18 NOTE — Therapy (Signed)
 OUTPATIENT PHYSICAL THERAPY LOWER EXTREMITY EVALUATION   Patient Name: Abigail Wright MRN: [id number] DOB:09-Feb-1966, 58 y.o., female Today's Date: 05/18/2023  END OF SESSION:  PT End of Session - 05/18/23 1356     Visit Number 1    Number of Visits 17    Date for PT Re-Evaluation 07/13/23    Authorization Type Generic Commerical    Authorization Time Period 05/18/23 to 07/13/23    PT Start Time 1347    PT Stop Time 1426    PT Time Calculation (min) 39 min    Equipment Utilized During Treatment Left knee immobilizer    Activity Tolerance Patient tolerated treatment well    Behavior During Therapy WFL for tasks assessed/performed             Past Medical History:  Diagnosis Date   ADD (attention deficit disorder)    Anxiety    Arthritis    Blood transfusion without reported diagnosis    1993   Depression    GERD (gastroesophageal reflux disease)    in past but recently controlled with diet and exercise.   Hyperlipidemia    Hypertension    Pneumonia    Past Surgical History:  Procedure Laterality Date   ABDOMINAL HYSTERECTOMY  1993   BUNIONECTOMY Left    CARPAL TUNNEL RELEASE Bilateral    cyst removal from left hand Bilateral    FOOT SURGERY Left    plantar fascicitis surgery   KNEE CARTILAGE SURGERY Left    SHOULDER ARTHROSCOPY WITH SUBACROMIAL DECOMPRESSION AND OPEN ROTATOR C Right 07/07/2021   Procedure: RIGHT SHOULDER ARTHROSCOPY WITH SUBACROMIAL DECOMPRESSION / ROTATOR CUFF REPAIR/ DISTAL CLAVICLE RESECTION /BICEPS TENODESIS;  Surgeon: Valeria Batman, MD;  Location: [facility];  Service: Orthopedics;  Laterality: Right;   TENDON REPAIR Left 03/21/2017   Left hand    TONSILLECTOMY     TOTAL KNEE ARTHROPLASTY Left 05/13/2023   Procedure: LEFT TOTAL KNEE ARTHROPLASTY;  Surgeon: Kathryne Hitch, MD;  Location: [facility];  Service: Orthopedics;  Laterality: Left;   Patient Active Problem List   Diagnosis Date Noted   Status post total left knee  replacement 05/13/2023   Chronic heel pain, right 01/27/2023   Unilateral primary osteoarthritis, left knee 08/19/2022   Pain in right knee 08/04/2021   Olecranon bursitis, right elbow 08/04/2021   Complete tear of right rotator cuff 07/14/2021   Impingement syndrome of right shoulder 06/17/2021   Major depressive disorder, recurrent episode, moderate (HCC) 03/06/2021   Pain in left knee 11/04/2017   Benign paroxysmal positional vertigo of left ear 05/15/2015   Family history of breast cancer in mother 08/02/2011    PCP: Sharlene Dory DO   REFERRING PROVIDER: Kathryne Hitch, MD  REFERRING DIAG: [phone] (ICD-10-CM) - Status post total left knee replacement M17.12 (ICD-10-CM) - Unilateral primary osteoarthritis, left knee  THERAPY DIAG:  Acute pain of left knee  Stiffness of left knee, not elsewhere classified  Localized edema  Muscle weakness (generalized)  Difficulty in walking, not elsewhere classified  Rationale for Evaluation and Treatment: Rehabilitation  ONSET DATE:  surgery 05/13/23  SUBJECTIVE:   SUBJECTIVE STATEMENT:  Had felt pretty good after surgery, haven't had a lot of pain. Did a lot of walking today, more swollen now. Had some pain getting into/out of the car just now coming here. I've had some issues with sores in my mouth and weird tastes/off tastes since surgery, having a hard time eating due to that.   PERTINENT HISTORY:  See above  PAIN:  Are you having pain? Yes: NPRS scale: 5/10 Pain location: L knee  Pain description: tight, hot, achey, difficult to relieve pain  Aggravating factors: car transfer, doing too much  Relieving factors: ice, meds, movement    PRECAUTIONS: Fall  RED FLAGS: None   WEIGHT BEARING RESTRICTIONS: No  FALLS:  Has patient fallen in last 6 months? No  LIVING ENVIRONMENT: Lives with: lives with their spouse Lives in: House/apartment Stairs: 2 story home, has ramp in the back of the house + 1-2  steps with rail to enter home. 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    PATIENT SURVEYS:    Patient-Specific Activity Scoring Scheme  "0" represents "unable to perform." "10" represents "able to perform at prior level. 0 1 2 3 4 5 6 7 8 9  10 (Date and Score)   Activity Eval     1. Getting onto the floor to play with grandkids  0     2. Stairs  5     3. Standing for a long period of time  5   4.    5.    Score 3.3    Total score = sum of the activity scores/number of activities Minimum detectable change (90%CI) for average score = 2 points Minimum detectable change (90%CI) for single activity score = 3 points     COGNITION: Overall cognitive status: Within functional limits for tasks assessed       EDEMA:   As appropriate given surgery 5 days ago     LOWER EXTREMITY ROM:  Active ROM Left eval  Hip flexion   Hip extension   Hip abduction   Hip adduction   Hip internal rotation   Hip external rotation   Knee flexion 70* supine heel slide   Knee extension 15* supine with heel prop  Ankle dorsiflexion   Ankle plantarflexion   Ankle inversion   Ankle eversion    (Blank rows = not tested)  LOWER EXTREMITY MMT:  MMT Right eval Left eval  Hip flexion 5 3-  Hip extension    Hip abduction    Hip adduction    Hip internal rotation    Hip external rotation    Knee flexion 5 Deferred, knee pain    Knee extension 5 3 in available ROM, did not provide manual resistance due to pain levels    Ankle dorsiflexion    Ankle plantarflexion    Ankle inversion    Ankle eversion     (Blank rows = not tested)    FUNCTIONAL TESTS:  5 times sit to stand: 13 seconds no UEs high mat table  Timed up and go (TUG): 28 seconds RW  3 minute walk test: 38ft RW   GAIT: Distance walked: 360ft  Assistive device utilized: Environmental consultant - 2 wheeled Level of assistance: Modified independence Comments: limited L knee ROM with gait cycle, heavy reliance on RW with UEs, mildly antalgic  TREATMENT DATE:   05/18/23  Eval, POC, HEP- encouraged regular icing with ice man at home, use of compression stockings, continue with hospital HEP, no need to wear KI at this point, correct height/fit for RW, signs of over-doing    Bike for ROM x5 min at EOS, partial rotations with holds for flexion stretch    PATIENT EDUCATION:  Education details: see above  Person educated: Patient and Spouse Education method: Explanation, Demonstration, and Handouts Education comprehension: verbalized understanding, returned demonstration, and needs further education  HOME EXERCISE PROGRAM: Hospital HEP   ASSESSMENT:  CLINICAL IMPRESSION: Patient is a 58 y.o. F who was seen today for physical therapy evaluation and treatment for Z96.652 (ICD-10-CM) - Status post total left knee replacement M17.12 (ICD-10-CM) - Unilateral primary osteoarthritis, left knee. Exam was as typical and as expected given recent TKR surgery just 5 days ago. She is very motivated to improve, anticipate she will do well with skilled PT services.    OBJECTIVE IMPAIRMENTS: Abnormal gait, decreased activity tolerance, decreased balance, decreased knowledge of use of DME, decreased mobility, difficulty walking, decreased ROM, decreased strength, increased edema, increased fascial restrictions, impaired flexibility, obesity, and  pain.   ACTIVITY LIMITATIONS: sitting, standing, squatting, stairs, transfers, and locomotion level  PARTICIPATION LIMITATIONS: driving, shopping, community activity, occupation, and yard work  PERSONAL FACTORS: Age, Education, Fitness, Past/current experiences, Social background, and Time since onset of injury/illness/exacerbation are also affecting patient's functional outcome.   REHAB POTENTIAL: Good  CLINICAL DECISION MAKING: Stable/uncomplicated  EVALUATION COMPLEXITY: Low   GOALS: Goals reviewed with patient? No  SHORT TERM GOALS: Target date: 06/15/2023   Will be compliant with appropriate progressive HEP Goal status: initial    2. L knee AROM flexion to be at least 100* and extension AROM to be no more than 5*  Goal status: initial   3. Will be independent with edema management strategies  Goal status: initial    4. Gait pattern to have normalized with LRAD Goal status: Initial    LONG TERM GOALS: Target date: 07/13/2023    MMT to have improved by one grade all weak groups Goal status: initial   2. Knee AROM to be WNL and pain free Goal status: initial    3. Will be able to ascend and descend stairs reciprocally with no increase in pain Goal status: initial   4. Will be able to ambulate community distances and perform all household tasks with no increase in pain  Goal status: initial   5. PSFS  to have improved by at least 3 points to show improved QOL and subjective improvement  Goal status: initial   6. Will be able to get down to the floor to play with and take care of grand-kids Goal status: initial     PLAN:  PT FREQUENCY: 2x/week  PT DURATION: 8 weeks  PLANNED INTERVENTIONS: 97164- PT Re-evaluation, 97110-Therapeutic exercises, 97530- Therapeutic activity, 97112- Neuromuscular re-education, 97535- Self Care, [address]- Manual therapy, [id number]- Gait training, [phone]- Orthotic Fit/training, [phone]- Vasopneumatic device, Patient/Family education,  Balance training, Stair training, Taping, Dry Needling, Cryotherapy, and Moist heat  PLAN FOR NEXT SESSION: ROM, strength, gait, HEP updates PRN   Nedra Hai, PT, DPT 05/18/23 2:36 PM

## 2023-05-19 ENCOUNTER — Other Ambulatory Visit: Payer: Self-pay | Admitting: Orthopaedic Surgery

## 2023-05-19 ENCOUNTER — Telehealth: Payer: Self-pay | Admitting: Radiology

## 2023-05-19 NOTE — Telephone Encounter (Signed)
 Patient's husband called and states that he spoke with [employer] rep who advised he could come here and get [place]. He is currently on his way. Per email to [facility] for patient's husband to pick up. Placed at front.

## 2023-05-20 ENCOUNTER — Other Ambulatory Visit: Payer: Self-pay | Admitting: Orthopaedic Surgery

## 2023-05-23 ENCOUNTER — Other Ambulatory Visit: Payer: Self-pay | Admitting: Orthopaedic Surgery

## 2023-05-23 ENCOUNTER — Telehealth: Payer: Self-pay

## 2023-05-23 NOTE — Telephone Encounter (Signed)
 Patient's daughter Alcario Drought called concerning swelling that patient is having behind her left knee.  Patient had left TKA on 05/13/2023.  CB# [phone].  Please advise.  Thank you.

## 2023-05-23 NOTE — Telephone Encounter (Signed)
 Patient would like a Rx refill on Oxycodone and on the muscle relaxer sent to her pharmacy.  CB# [phone].  Please advise.  Thank you.

## 2023-05-23 NOTE — Telephone Encounter (Signed)
 Spoke with patient and let her know that swelling is common after a TKA, she denies calf pain  Continue to elevate and ice

## 2023-05-24 ENCOUNTER — Ambulatory Visit: Payer: PRIVATE HEALTH INSURANCE | Attending: Orthopaedic Surgery | Admitting: Physical Therapy

## 2023-05-24 DIAGNOSIS — R262 Difficulty in walking, not elsewhere classified: Secondary | ICD-10-CM | POA: Insufficient documentation

## 2023-05-24 DIAGNOSIS — M25511 Pain in right shoulder: Secondary | ICD-10-CM | POA: Insufficient documentation

## 2023-05-24 DIAGNOSIS — M25662 Stiffness of left knee, not elsewhere classified: Secondary | ICD-10-CM | POA: Diagnosis present

## 2023-05-24 DIAGNOSIS — M6281 Muscle weakness (generalized): Secondary | ICD-10-CM | POA: Diagnosis present

## 2023-05-24 DIAGNOSIS — M25562 Pain in left knee: Secondary | ICD-10-CM | POA: Insufficient documentation

## 2023-05-24 DIAGNOSIS — R600 Localized edema: Secondary | ICD-10-CM | POA: Diagnosis present

## 2023-05-24 NOTE — Therapy (Signed)
 OUTPATIENT PHYSICAL THERAPY LOWER EXTREMITY EVALUATION   Patient Name: Abigail Wright MRN: [id number] DOB:1966/01/03, 58 y.o., female Today's Date: 05/24/2023  END OF SESSION:  PT End of Session - 05/24/23 1311     Visit Number 2    Number of Visits 17    Date for PT Re-Evaluation 07/13/23    Authorization Type Generic Commerical    Authorization Time Period 05/18/23 to 07/13/23    PT Start Time 1310    PT Stop Time 1400    PT Time Calculation (min) 50 min             Past Medical History:  Diagnosis Date   ADD (attention deficit disorder)    Anxiety    Arthritis    Blood transfusion without reported diagnosis    1993   Depression    GERD (gastroesophageal reflux disease)    in past but recently controlled with diet and exercise.   Hyperlipidemia    Hypertension    Pneumonia    Past Surgical History:  Procedure Laterality Date   ABDOMINAL HYSTERECTOMY  1993   BUNIONECTOMY Left    CARPAL TUNNEL RELEASE Bilateral    cyst removal from left hand Bilateral    FOOT SURGERY Left    plantar fascicitis surgery   KNEE CARTILAGE SURGERY Left    SHOULDER ARTHROSCOPY WITH SUBACROMIAL DECOMPRESSION AND OPEN ROTATOR C Right 07/07/2021   Procedure: RIGHT SHOULDER ARTHROSCOPY WITH SUBACROMIAL DECOMPRESSION / ROTATOR CUFF REPAIR/ DISTAL CLAVICLE RESECTION /BICEPS TENODESIS;  Surgeon: Valeria Batman, MD;  Location: [facility];  Service: Orthopedics;  Laterality: Right;   TENDON REPAIR Left 03/21/2017   Left hand    TONSILLECTOMY     TOTAL KNEE ARTHROPLASTY Left 05/13/2023   Procedure: LEFT TOTAL KNEE ARTHROPLASTY;  Surgeon: Kathryne Hitch, MD;  Location: [facility];  Service: Orthopedics;  Laterality: Left;   Patient Active Problem List   Diagnosis Date Noted   Status post total left knee replacement 05/13/2023   Chronic heel pain, right 01/27/2023   Unilateral primary osteoarthritis, left knee 08/19/2022   Pain in right knee 08/04/2021   Olecranon bursitis, right  elbow 08/04/2021   Complete tear of right rotator cuff 07/14/2021   Impingement syndrome of right shoulder 06/17/2021   Major depressive disorder, recurrent episode, moderate (HCC) 03/06/2021   Pain in left knee 11/04/2017   Benign paroxysmal positional vertigo of left ear 05/15/2015   Family history of breast cancer in mother 08/02/2011    PCP: Sharlene Dory DO   REFERRING PROVIDER: Kathryne Hitch, MD  REFERRING DIAG: [phone] (ICD-10-CM) - Status post total left knee replacement M17.12 (ICD-10-CM) - Unilateral primary osteoarthritis, left knee  THERAPY DIAG:  Acute pain of left knee  Stiffness of left knee, not elsewhere classified  Localized edema  Muscle weakness (generalized)  Difficulty in walking, not elsewhere classified  Rationale for Evaluation and Treatment: Rehabilitation  ONSET DATE:  surgery 05/13/23  SUBJECTIVE:   SUBJECTIVE STATEMENT: I am blessed with little pain but wow really felt it after last session PERTINENT HISTORY: See above  PAIN:  Are you having pain? Yes: NPRS scale: 5/10 Pain location: L knee  Pain description: tight, hot, achey, difficult to relieve pain  Aggravating factors: car transfer, doing too much  Relieving factors: ice, meds, movement    PRECAUTIONS: Fall  RED FLAGS: None   WEIGHT BEARING RESTRICTIONS: No  FALLS:  Has patient fallen in last 6 months? No  LIVING ENVIRONMENT: Lives with: lives with  their spouse Lives in: House/apartment Stairs: 2 story home, has ramp in the back of the house + 1-2 steps with rail to enter home. 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    PATIENT SURVEYS:    Patient-Specific Activity Scoring Scheme  "0" represents "unable to perform." "10" represents "able to perform at prior level. 0 1 2 3 4 5 6 7 8 9  10 (Date and Score)   Activity Eval     1. Getting onto the floor to play with grandkids  0     2. Stairs  5     3. Standing for a long period of time  5   4.    5.    Score 3.3    Total score = sum of the activity scores/number of activities Minimum detectable change (90%CI) for average score = 2 points Minimum detectable change (90%CI) for single activity score = 3 points     COGNITION: Overall cognitive status: Within functional limits for tasks assessed       EDEMA:   As appropriate given surgery 5 days ago     LOWER EXTREMITY ROM:  Active ROM Left eval  Hip flexion   Hip extension   Hip abduction   Hip adduction   Hip internal rotation   Hip external rotation   Knee flexion 70* supine heel slide   Knee extension 15* supine with heel prop  Ankle dorsiflexion   Ankle plantarflexion   Ankle inversion   Ankle eversion    (Blank rows = not tested)  LOWER EXTREMITY MMT:  MMT Right eval Left eval  Hip flexion 5 3-  Hip extension    Hip abduction    Hip adduction    Hip internal rotation    Hip external rotation    Knee flexion 5 Deferred, knee pain    Knee extension 5 3 in available ROM, did not provide manual resistance due to pain levels   Ankle dorsiflexion    Ankle plantarflexion    Ankle inversion    Ankle eversion     (Blank rows = not tested)    FUNCTIONAL TESTS:  5 times sit to stand: 13 seconds no UEs high mat table  Timed up and go (TUG): 28 seconds RW  3 minute walk test: 310ft RW   GAIT: Distance walked: 335ft  Assistive device utilized: Environmental consultant - 2 wheeled Level of assistance: Modified independence Comments: limited L  knee ROM with gait cycle, heavy reliance on RW with UEs, mildly antalgic  TREATMENT DATE:   05/24/23 Nustep L 4 - educ on elevation above heart with ice to help sweling Quad set 10 x Quad set with SAQ 10 x LAQ 10 x  2 sets HS curl red tband 2 sets 10 AROM sitting 5-90 PROM flex and ext Gait training with SPC -working on stride,step through,sequncing and heel strike Slant board Black bar heel raises  and toe raises 10x 2 sets  Leg press 20# BIL 2 sets 10      05/18/23  Eval, POC, HEP- encouraged regular icing with ice man at home, use of compression stockings, continue with hospital HEP, no need to wear KI at this point, correct height/fit for RW, signs of over-doing    Bike for ROM x5 min at EOS, partial rotations with holds for flexion stretch    PATIENT EDUCATION:  Education details: see above  Person educated: Patient and Spouse Education method: Explanation, Demonstration, and Handouts Education comprehension: verbalized understanding, returned demonstration, and needs further education  HOME EXERCISE PROGRAM: Hospital HEP   ASSESSMENT:  CLINICAL IMPRESSION: pt arrived in minimal pain on RW,stated very sore after evaluation and I explained that is normal to have pain after. Progressed ex and she did very well,instructed to ice with elevation when gets home. Transition to [facility] and okayed to use at home. Progressed ex with cuing to isolate correct muscle es for quad vs hip flexor .    OBJECTIVE IMPAIRMENTS: Abnormal gait, decreased activity tolerance, decreased balance, decreased knowledge of use of DME, decreased mobility, difficulty walking, decreased ROM, decreased strength, increased edema, increased fascial restrictions, impaired flexibility, obesity, and pain.   ACTIVITY LIMITATIONS: sitting, standing, squatting, stairs, transfers, and  locomotion level  PARTICIPATION LIMITATIONS: driving, shopping, community activity, occupation, and yard work  PERSONAL FACTORS: Age, Education, Fitness, Past/current experiences, Social background, and Time since onset of injury/illness/exacerbation are also affecting patient's functional outcome.   REHAB POTENTIAL: Good  CLINICAL DECISION MAKING: Stable/uncomplicated  EVALUATION COMPLEXITY: Low   GOALS: Goals reviewed with patient? No  SHORT TERM GOALS: Target date: 06/15/2023   Will be compliant with appropriate progressive HEP Goal status: initial    2. L knee AROM flexion to be at least 100* and extension AROM to be no more than 5*  Goal status: initial   3. Will be independent with edema management strategies  Goal status: initial    4. Gait pattern to have normalized with LRAD Goal status: Initial    LONG TERM GOALS: Target date: 07/13/2023    MMT to have improved by one grade all weak groups Goal status: initial   2. Knee AROM to be WNL and pain free Goal status: initial    3. Will be able to ascend and descend stairs reciprocally with no increase in pain Goal status: initial   4. Will be able to ambulate community distances and perform all household tasks with no increase in pain  Goal status: initial   5. PSFS  to have improved by at least 3 points to show improved QOL and subjective improvement  Goal status: initial   6. Will be able to get down to the floor to play with and take care of grand-kids Goal status: initial     PLAN:  PT FREQUENCY: 2x/week  PT DURATION: 8 weeks  PLANNED INTERVENTIONS: 97164- PT Re-evaluation, 97110-Therapeutic exercises, 97530- Therapeutic activity, 97112- Neuromuscular re-education, 97535- Self Care, 78295- Manual therapy, [phone]- Gait training, [phone]- Orthotic Fit/training, [phone]- Vasopneumatic device, Patient/Family education, Balance training, Stair training, Taping,  Dry Needling, Cryotherapy, and Moist  heat  PLAN FOR NEXT SESSION: assess how she did today and increase HEP ( currently only doing ankle pumps,quad sets, glut sets and SLR)   Patient Details  Name: Indy Makynlie Rossini MRN: [id number] Date of Birth: 26-Jul-1965 Referring Provider:  Kathryne Hitch*  Encounter Date: 05/24/2023   Suanne Marker, PTA 05/24/2023, 1:12 PM  [place] [facility] at [facility] [address] [facility] [place], [place], [address] Phone: [phone]   Fax:  [phone]

## 2023-05-26 ENCOUNTER — Encounter: Payer: Self-pay | Admitting: Orthopaedic Surgery

## 2023-05-26 ENCOUNTER — Ambulatory Visit (INDEPENDENT_AMBULATORY_CARE_PROVIDER_SITE_OTHER): Payer: No Typology Code available for payment source | Admitting: Orthopaedic Surgery

## 2023-05-26 DIAGNOSIS — Z96652 Presence of left artificial knee joint: Secondary | ICD-10-CM

## 2023-05-26 NOTE — Progress Notes (Signed)
 The patient is a young 58 year old female who is here today for first postoperative visit 2 weeks status post a left total knee replacement to treat significant left knee pain and arthritis.  She denies calf pain.  She has been wearing compression hose.  She is already in outpatient physical therapy.  She reports that they have been able to flex her to 90 degrees.  Her suture line looks good.  The staples were removed and stress was applied.  Her calf is soft on her left knee.  Her extension is full and I can flex her to about 90 degrees.  She will continue outpatient physical therapy for her left knee.  When she does need a refill of pain medicine she will let us know.  I would like to see her back in 4 weeks for repeat exam to see how her range of motion is coming along.

## 2023-05-27 ENCOUNTER — Ambulatory Visit: Payer: PRIVATE HEALTH INSURANCE | Admitting: Physical Therapy

## 2023-05-27 DIAGNOSIS — M25662 Stiffness of left knee, not elsewhere classified: Secondary | ICD-10-CM

## 2023-05-27 DIAGNOSIS — M25562 Pain in left knee: Secondary | ICD-10-CM | POA: Diagnosis not present

## 2023-05-27 DIAGNOSIS — R600 Localized edema: Secondary | ICD-10-CM

## 2023-05-27 DIAGNOSIS — R262 Difficulty in walking, not elsewhere classified: Secondary | ICD-10-CM

## 2023-05-27 DIAGNOSIS — M6281 Muscle weakness (generalized): Secondary | ICD-10-CM

## 2023-05-27 NOTE — Therapy (Signed)
 OUTPATIENT PHYSICAL THERAPY LOWER EXTREMITY    Patient Name: Abigail Wright MRN: [id number] DOB:1965/03/11, 58 y.o., female Today's Date: 05/27/2023  END OF SESSION:  PT End of Session - 05/27/23 1006     Visit Number 3    Number of Visits 17    Date for PT Re-Evaluation 07/13/23    Authorization Type Generic Commerical    Authorization Time Period 05/18/23 to 07/13/23    PT Start Time 1007    PT Stop Time 1055    PT Time Calculation (min) 48 min             Past Medical History:  Diagnosis Date   ADD (attention deficit disorder)    Anxiety    Arthritis    Blood transfusion without reported diagnosis    1993   Depression    GERD (gastroesophageal reflux disease)    in past but recently controlled with diet and exercise.   Hyperlipidemia    Hypertension    Pneumonia    Past Surgical History:  Procedure Laterality Date   ABDOMINAL HYSTERECTOMY  1993   BUNIONECTOMY Left    CARPAL TUNNEL RELEASE Bilateral    cyst removal from left hand Bilateral    FOOT SURGERY Left    plantar fascicitis surgery   KNEE CARTILAGE SURGERY Left    SHOULDER ARTHROSCOPY WITH SUBACROMIAL DECOMPRESSION AND OPEN ROTATOR C Right 07/07/2021   Procedure: RIGHT SHOULDER ARTHROSCOPY WITH SUBACROMIAL DECOMPRESSION / ROTATOR CUFF REPAIR/ DISTAL CLAVICLE RESECTION /BICEPS TENODESIS;  Surgeon: Valeria Batman, MD;  Location: [facility];  Service: Orthopedics;  Laterality: Right;   TENDON REPAIR Left 03/21/2017   Left hand    TONSILLECTOMY     TOTAL KNEE ARTHROPLASTY Left 05/13/2023   Procedure: LEFT TOTAL KNEE ARTHROPLASTY;  Surgeon: Kathryne Hitch, MD;  Location: [facility];  Service: Orthopedics;  Laterality: Left;   Patient Active Problem List   Diagnosis Date Noted   Status post total left knee replacement 05/13/2023   Chronic heel pain, right 01/27/2023   Pain in right knee 08/04/2021   Olecranon bursitis, right elbow 08/04/2021   Complete tear of right rotator cuff 07/14/2021    Impingement syndrome of right shoulder 06/17/2021   Major depressive disorder, recurrent episode, moderate (HCC) 03/06/2021   Pain in left knee 11/04/2017   Benign paroxysmal positional vertigo of left ear 05/15/2015   Family history of breast cancer in mother 08/02/2011    PCP: Sharlene Dory DO   REFERRING PROVIDER: Kathryne Hitch, MD  REFERRING DIAG: [phone] (ICD-10-CM) - Status post total left knee replacement M17.12 (ICD-10-CM) - Unilateral primary osteoarthritis, left knee  THERAPY DIAG:  Acute pain of left knee  Stiffness of left knee, not elsewhere classified  Localized edema  Muscle weakness (generalized)  Difficulty in walking, not elsewhere classified  Rationale for Evaluation and Treatment: Rehabilitation  ONSET DATE:  surgery 05/13/23  SUBJECTIVE:   SUBJECTIVE STATEMENT: Amb in with SPC, saw MD and staples out and very pleased. PERTINENT HISTORY: See above  PAIN:  Are you having pain? Yes: NPRS scale: 3/10 Pain location: L knee  Pain description: tight, hot, achey, difficult to relieve pain  Aggravating factors: car transfer, doing too much  Relieving factors: ice, meds, movement    PRECAUTIONS: Fall  RED FLAGS: None   WEIGHT BEARING RESTRICTIONS: No  FALLS:  Has patient fallen in last 6 months? No  LIVING ENVIRONMENT: Lives with: lives with their spouse Lives in: House/apartment Stairs: 2 story home, has  ramp in the back of the house + 1-2 steps with rail to enter home. 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    PATIENT SURVEYS:    Patient-Specific Activity Scoring Scheme  "0" represents "unable to perform." "10" represents "able to perform at prior level. 0 1 2 3 4 5 6 7 8 9  10 (Date and Score)   Activity Eval     1. Getting onto the floor to play with grandkids  0     2. Stairs  5     3. Standing for a long period of time  5   4.    5.    Score 3.3    Total score = sum of the activity scores/number of activities Minimum detectable change (90%CI) for average score = 2 points Minimum detectable change (90%CI) for single activity score = 3 points     COGNITION: Overall cognitive status: Within functional limits for tasks assessed       EDEMA:   As appropriate given surgery 5 days ago     LOWER EXTREMITY ROM:  Active ROM Left eval  Hip flexion   Hip extension   Hip abduction   Hip adduction   Hip internal rotation   Hip external rotation   Knee flexion 70* supine heel slide   Knee extension 15* supine with heel prop  Ankle dorsiflexion   Ankle plantarflexion   Ankle inversion   Ankle eversion    (Blank rows = not tested)  LOWER EXTREMITY MMT:  MMT Right eval Left eval  Hip flexion 5 3-  Hip extension    Hip abduction    Hip adduction    Hip internal rotation    Hip external rotation    Knee flexion 5 Deferred, knee pain    Knee extension 5 3 in available ROM, did not provide manual resistance due to pain levels   Ankle dorsiflexion    Ankle plantarflexion    Ankle inversion    Ankle eversion     (Blank rows = not tested)    FUNCTIONAL TESTS:  5 times sit to stand: 13 seconds no UEs high mat table  Timed up and go (TUG): 28 seconds RW  3 minute walk test: 310ft RW   GAIT: Distance walked: 311ft  Assistive device utilized: Environmental consultant - 2 wheeled Level of assistance: Modified independence Comments: limited L knee ROM with gait cycle, heavy reliance on RW with UEs, mildly antalgic  TREATMENT DATE:   05/27/23 Nustep L 5 HS curl LLE 15# 2 sets 10 Knee ext 5# LLE only 2 sets 10 Leg Press 20# BIL 10 x, LLE only 10 x, LLE unlocked for ROM 10 x Calf raises 30# 2 sets 10 30# resisted gait 5 x fwd,5 x backward, 3 x each side Green tband TKE 2 sets 10 Patellar mobs, Pass flex and ext Act flexion after 100   05/24/23 Nustep L 4 - educ on elevation above heart with ice to help sweling Quad set 10 x Quad set with SAQ 10 x LAQ 10 x  2 sets HS curl red tband 2 sets 10 AROM sitting 5-90 PROM flex and ext Gait training with SPC -working on stride,step through,sequncing and heel strike Slant board Black bar heel raises  and toe raises 10x 2 sets  Leg press 20# BIL 2 sets 10      05/18/23  Eval, POC, HEP- encouraged regular icing with ice man at home, use of compression stockings, continue with hospital HEP, no need to wear KI at this point, correct height/fit for RW, signs of over-doing    Bike for ROM x5 min at EOS, partial rotations with holds for flexion stretch    PATIENT EDUCATION:  Education details: see above  Person educated: Patient and Spouse Education method: Explanation, Demonstration, and Handouts Education comprehension: verbalized understanding, returned demonstration, and needs further education  HOME EXERCISE PROGRAM: Hospital HEP   ASSESSMENT:  CLINICAL IMPRESSION: pt arrived in minimal pain on SPC doing very well. Progressed ex and she did very well. Cautioned about overdoing and ice and elevation.    OBJECTIVE IMPAIRMENTS: Abnormal gait, decreased activity tolerance, decreased balance, decreased knowledge of use of DME, decreased mobility, difficulty walking, decreased ROM, decreased strength, increased edema, increased fascial restrictions, impaired flexibility, obesity, and pain.   ACTIVITY LIMITATIONS: sitting,  standing, squatting, stairs, transfers, and locomotion level  PARTICIPATION LIMITATIONS: driving, shopping, community activity, occupation, and yard work  PERSONAL FACTORS: Age, Education, Fitness, Past/current experiences, Social background, and Time since onset of injury/illness/exacerbation are also affecting patient's functional outcome.   REHAB POTENTIAL: Good  CLINICAL DECISION MAKING: Stable/uncomplicated  EVALUATION COMPLEXITY: Low   GOALS: Goals reviewed with patient? No  SHORT TERM GOALS: Target date: 06/15/2023   Will be compliant with appropriate progressive HEP Goal status: 05/27/23 MET   2. L knee AROM flexion to be at least 100* and extension AROM to be no more than 5*  Goal status: 05/27/23 progressing   3. Will be independent with edema management strategies  Goal status: 05/27/23 MET   4. Gait pattern to have normalized with LRAD Goal status:progressing 05/27/23   LONG TERM GOALS: Target date: 07/13/2023    MMT to have improved by one grade all weak groups Goal status: initial   2. Knee AROM to be WNL and pain free Goal status: initial    3. Will be able to ascend and descend stairs reciprocally with no increase in pain Goal status: initial   4. Will be able to ambulate community distances and perform all household tasks with no increase in pain  Goal status: initial   5. PSFS  to have improved by at least 3 points to show improved QOL and subjective improvement  Goal status: initial   6. Will be able to get down to the floor to play with and take care of grand-kids Goal status: initial     PLAN:  PT FREQUENCY: 2x/week  PT DURATION: 8  weeks  PLANNED INTERVENTIONS: 97164- PT Re-evaluation, 97110-Therapeutic exercises, 97530- Therapeutic activity, 97112- Neuromuscular re-education, 97535- Self Care, 56213- Manual therapy, [phone]- Gait training, [phone]- Orthotic Fit/training, [phone]- Vasopneumatic device, Patient/Family education, Balance training,  Stair training, Taping, Dry Needling, Cryotherapy, and Moist heat  PLAN FOR NEXT SESSION: assess how she did today and increase HEP ( currently only doing ankle pumps,quad sets, glut sets and SLR)   Patient Details  Name: Abigail Wright MRN: [id number] Date of Birth: February 13, 1966 Referring Provider:  Kathryne Hitch*  Encounter Date: 05/27/2023   Suanne Marker, PTA 05/27/2023, 10:06 AM  [place] at [facility] [address] [place], [place], [address] Phone: [phone]   Fax:  [phone] [address] [place], [place], [address] Phone: [phone]   Fax:  [phone]  Patient Details  Name: Abigail Wright MRN: [id number] Date of Birth: [birth year] Referring Provider:  Kathryne Hitch*  Encounter Date: 05/27/2023   Suanne Marker, PTA 05/27/2023, 10:06 AM  [place] [facility] at [facility] [address] [facility]. [place], [place], [address] Phone: [phone]   Fax:  [phone]

## 2023-05-31 ENCOUNTER — Ambulatory Visit: Payer: PRIVATE HEALTH INSURANCE | Admitting: Physical Therapy

## 2023-05-31 ENCOUNTER — Other Ambulatory Visit: Payer: Self-pay | Admitting: Physician Assistant

## 2023-05-31 DIAGNOSIS — M25562 Pain in left knee: Secondary | ICD-10-CM | POA: Diagnosis not present

## 2023-05-31 DIAGNOSIS — M25662 Stiffness of left knee, not elsewhere classified: Secondary | ICD-10-CM

## 2023-05-31 DIAGNOSIS — R262 Difficulty in walking, not elsewhere classified: Secondary | ICD-10-CM

## 2023-05-31 DIAGNOSIS — M6281 Muscle weakness (generalized): Secondary | ICD-10-CM

## 2023-05-31 DIAGNOSIS — R600 Localized edema: Secondary | ICD-10-CM

## 2023-05-31 NOTE — Therapy (Signed)
 OUTPATIENT PHYSICAL THERAPY LOWER EXTREMITY    Patient Name: Abigail Wright MRN: [id number] DOB:1965-09-23, 58 y.o., female Today's Date: 05/31/2023  END OF SESSION:  PT End of Session - 05/31/23 1016     Visit Number 4    Number of Visits 17    Date for PT Re-Evaluation 07/13/23    Authorization Type Generic Commerical    Authorization Time Period 05/18/23 to 07/13/23    PT Start Time 1015    PT Stop Time 1100    PT Time Calculation (min) 45 min             Past Medical History:  Diagnosis Date   ADD (attention deficit disorder)    Anxiety    Arthritis    Blood transfusion without reported diagnosis    1993   Depression    GERD (gastroesophageal reflux disease)    in past but recently controlled with diet and exercise.   Hyperlipidemia    Hypertension    Pneumonia    Past Surgical History:  Procedure Laterality Date   ABDOMINAL HYSTERECTOMY  1993   BUNIONECTOMY Left    CARPAL TUNNEL RELEASE Bilateral    cyst removal from left hand Bilateral    FOOT SURGERY Left    plantar fascicitis surgery   KNEE CARTILAGE SURGERY Left    SHOULDER ARTHROSCOPY WITH SUBACROMIAL DECOMPRESSION AND OPEN ROTATOR C Right 07/07/2021   Procedure: RIGHT SHOULDER ARTHROSCOPY WITH SUBACROMIAL DECOMPRESSION / ROTATOR CUFF REPAIR/ DISTAL CLAVICLE RESECTION /BICEPS TENODESIS;  Surgeon: Valeria Batman, MD;  Location: [facility];  Service: Orthopedics;  Laterality: Right;   TENDON REPAIR Left 03/21/2017   Left hand    TONSILLECTOMY     TOTAL KNEE ARTHROPLASTY Left 05/13/2023   Procedure: LEFT TOTAL KNEE ARTHROPLASTY;  Surgeon: Kathryne Hitch, MD;  Location: [facility];  Service: Orthopedics;  Laterality: Left;   Patient Active Problem List   Diagnosis Date Noted   Status post total left knee replacement 05/13/2023   Chronic heel pain, right 01/27/2023   Pain in right knee 08/04/2021   Olecranon bursitis, right elbow 08/04/2021   Complete tear of right rotator cuff 07/14/2021    Impingement syndrome of right shoulder 06/17/2021   Major depressive disorder, recurrent episode, moderate (HCC) 03/06/2021   Pain in left knee 11/04/2017   Benign paroxysmal positional vertigo of left ear 05/15/2015   Family history of breast cancer in mother 08/02/2011    PCP: Sharlene Dory DO   REFERRING PROVIDER: Kathryne Hitch, MD  REFERRING DIAG: [phone] (ICD-10-CM) - Status post total left knee replacement M17.12 (ICD-10-CM) - Unilateral primary osteoarthritis, left knee  THERAPY DIAG:  Acute pain of left knee  Localized edema  Muscle weakness (generalized)  Difficulty in walking, not elsewhere classified  Stiffness of left knee, not elsewhere classified  Rationale for Evaluation and Treatment: Rehabilitation  ONSET DATE:  surgery 05/13/23  SUBJECTIVE:   SUBJECTIVE STATEMENT:  doing okay,al,almost stopped pain meds  PERTINENT HISTORY: See above  PAIN:  Are you having pain? Yes: NPRS scale: 3/10 Pain location: L knee  Pain description: tight, hot, achey, difficult to relieve pain  Aggravating factors: car transfer, doing too much  Relieving factors: ice, meds, movement    PRECAUTIONS: Fall  RED FLAGS: None   WEIGHT BEARING RESTRICTIONS: No  FALLS:  Has patient fallen in last 6 months? No  LIVING ENVIRONMENT: Lives with: lives with their spouse Lives in: House/apartment Stairs: 2 story home, has ramp in the back of  the house + 1-2 steps with rail to enter home. 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    PATIENT SURVEYS:    Patient-Specific Activity Scoring Scheme  "0" represents "unable to perform." "10" represents "able to perform at prior level. 0 1 2 3 4 5 6 7 8 9  10 (Date and Score)   Activity Eval     1. Getting onto the floor to play with grandkids  0     2. Stairs  5     3. Standing for a long period of time  5   4.    5.    Score 3.3    Total score = sum of the activity scores/number of activities Minimum detectable change (90%CI) for average score = 2 points Minimum detectable change (90%CI) for single activity score = 3 points     COGNITION: Overall cognitive status: Within functional limits for tasks assessed       EDEMA:   As appropriate given surgery 5 days ago     LOWER EXTREMITY ROM:  Active ROM Left eval  Hip flexion   Hip extension   Hip abduction   Hip adduction   Hip internal rotation   Hip external rotation   Knee flexion 70* supine heel slide   Knee extension 15* supine with heel prop  Ankle dorsiflexion   Ankle plantarflexion   Ankle inversion   Ankle eversion    (Blank rows = not tested)  LOWER EXTREMITY MMT:  MMT Right eval Left eval  Hip flexion 5 3-  Hip extension    Hip abduction    Hip adduction    Hip internal rotation    Hip external rotation    Knee flexion 5 Deferred, knee pain    Knee extension 5 3 in available ROM, did not provide manual resistance due to pain levels   Ankle dorsiflexion    Ankle plantarflexion    Ankle inversion    Ankle eversion     (Blank rows = not tested)    FUNCTIONAL TESTS:  5 times sit to stand: 13 seconds no UEs high mat table  Timed up and go (TUG): 28 seconds RW  3 minute walk test: 370ft RW   GAIT: Distance walked: 374ft  Assistive device utilized: Environmental consultant - 2 wheeled Level of assistance: Modified independence Comments: limited L knee ROM with gait cycle, heavy reliance on RW with UEs, mildly antalgic  TREATMENT DATE:   05/31/23 Bike L 2 6 min Pass flexion STW to TP in quad Act flexion 108 Pass Flexion 116 HS curl 20# 2 sets 10 Knee ext 5# LLE only  2 sets 10 Leg Press 30# 2 sets 10  LLE 10x 20# Calf raises 30# 2 sets 12 Resisted gait 30# fwd 10 x then added 6 inch step up  05/27/23 Nustep L 5 HS curl LLE 15# 2 sets 10 Knee ext 5# LLE only 2 sets 10 Leg Press 20# BIL 10 x, LLE only 10 x, LLE unlocked for ROM 10 x Calf raises 30# 2 sets 10 30# resisted gait 5 x fwd,5 x backward, 3 x each side Green tband TKE 2 sets 10 Patellar mobs, Pass flex and ext Act flexion after 100   05/24/23 Nustep L 4 - educ on elevation above heart with ice to help sweling Quad set 10 x Quad set with SAQ 10 x LAQ 10 x  2 sets HS curl red tband 2 sets 10 AROM sitting 5-90 PROM flex and ext Gait training with SPC -working on stride,step through,sequncing and heel strike Slant board Black bar heel raises  and toe raises 10x 2 sets  Leg press 20# BIL 2 sets 10      05/18/23  Eval, POC, HEP- encouraged regular icing with ice man at home, use of compression stockings, continue with hospital HEP, no need to wear KI at this point, correct height/fit for RW, signs of over-doing    Bike for ROM x5 min at EOS, partial rotations with holds for flexion stretch    PATIENT EDUCATION:  Education details: see above  Person educated: Patient and Spouse Education method: Explanation, Demonstration, and Handouts Education comprehension: verbalized understanding, returned demonstration, and needs further education  HOME EXERCISE PROGRAM: Hospital HEP   ASSESSMENT:  CLINICAL IMPRESSION: pt arrived doing very well. AROM and Pass checked and improving. STW to Left knee and okayed to do at home just not hard over incision.  Progressed ex. STGs met  OBJECTIVE IMPAIRMENTS: Abnormal gait, decreased  activity tolerance, decreased balance, decreased knowledge of use of DME, decreased mobility, difficulty walking, decreased ROM, decreased strength, increased edema, increased fascial restrictions, impaired flexibility, obesity, and pain.   ACTIVITY LIMITATIONS: sitting, standing, squatting, stairs, transfers, and locomotion level  PARTICIPATION LIMITATIONS: driving, shopping, community activity, occupation, and yard work  PERSONAL FACTORS: Age, Education, Fitness, Past/current experiences, Social background, and Time since onset of injury/illness/exacerbation are also affecting patient's functional outcome.   REHAB POTENTIAL: Good  CLINICAL DECISION MAKING: Stable/uncomplicated  EVALUATION COMPLEXITY: Low   GOALS: Goals reviewed with patient? No  SHORT TERM GOALS: Target date: 06/15/2023   Will be compliant with appropriate progressive HEP Goal status: 05/27/23 MET   2. L knee AROM flexion to be at least 100* and extension AROM to be no more than 5*  Goal status: 05/27/23 progressing   05/30/23 MET   3. Will be independent with edema management strategies  Goal status: 05/27/23 MET   4. Gait pattern to have normalized with LRAD Goal status:progressing 05/27/23   LONG TERM GOALS: Target date: 07/13/2023    MMT to have improved by one grade all weak groups Goal status: initial   2. Knee AROM to be WNL and pain free Goal status: initial    3. Will be able to ascend and descend stairs reciprocally with no increase in pain Goal status: initial   4. Will be able to ambulate community distances and perform  all household tasks with no increase in pain  Goal status: initial   5. PSFS  to have improved by at least 3 points to show improved QOL and subjective improvement  Goal status: initial   6. Will be able to get down to the floor to play with and take care of grand-kids Goal status: initial     PLAN:  PT FREQUENCY: 2x/week  PT DURATION: 8 weeks  PLANNED  INTERVENTIONS: 97164- PT Re-evaluation, 97110-Therapeutic exercises, 97530- Therapeutic activity, 97112- Neuromuscular re-education, 97535- Self Care, 84696- Manual therapy, [phone]- Gait training, [phone]- Orthotic Fit/training, [phone]- Vasopneumatic device, Patient/Family education, Balance training, Stair training, Taping, Dry Needling, Cryotherapy, and Moist heat  PLAN FOR NEXT SESSION: progress   Patient Details  Name: Leslee Lashai Grosch MRN: [id number] Date of Birth: 1965-10-13 Referring Provider:  Kathryne Hitch*  Encounter Date: 05/31/2023   Suanne Marker, PTA 05/31/2023, 10:16 AM  [facility] [address] [place], [place], [address] Phone: [phone]   Fax:  [phone] [address] [place], [place], [address] Phone: [phone]   Fax:  [phone]  Patient Details  Name: Earlean Huxley Vanwagoner MRN: [id number] Date of Birth: 10-01-1965 Referring Provider:  Kathryne Hitch*

## 2023-06-01 IMAGING — DX DG KNEE COMPLETE 4+V*L*
4 series · 4 of 4 positions shown · non-contrast
Comparison: None.

CLINICAL DATA: Fall, knee pain

EXAM:
LEFT KNEE - COMPLETE 4+ VIEW

[knee ap]
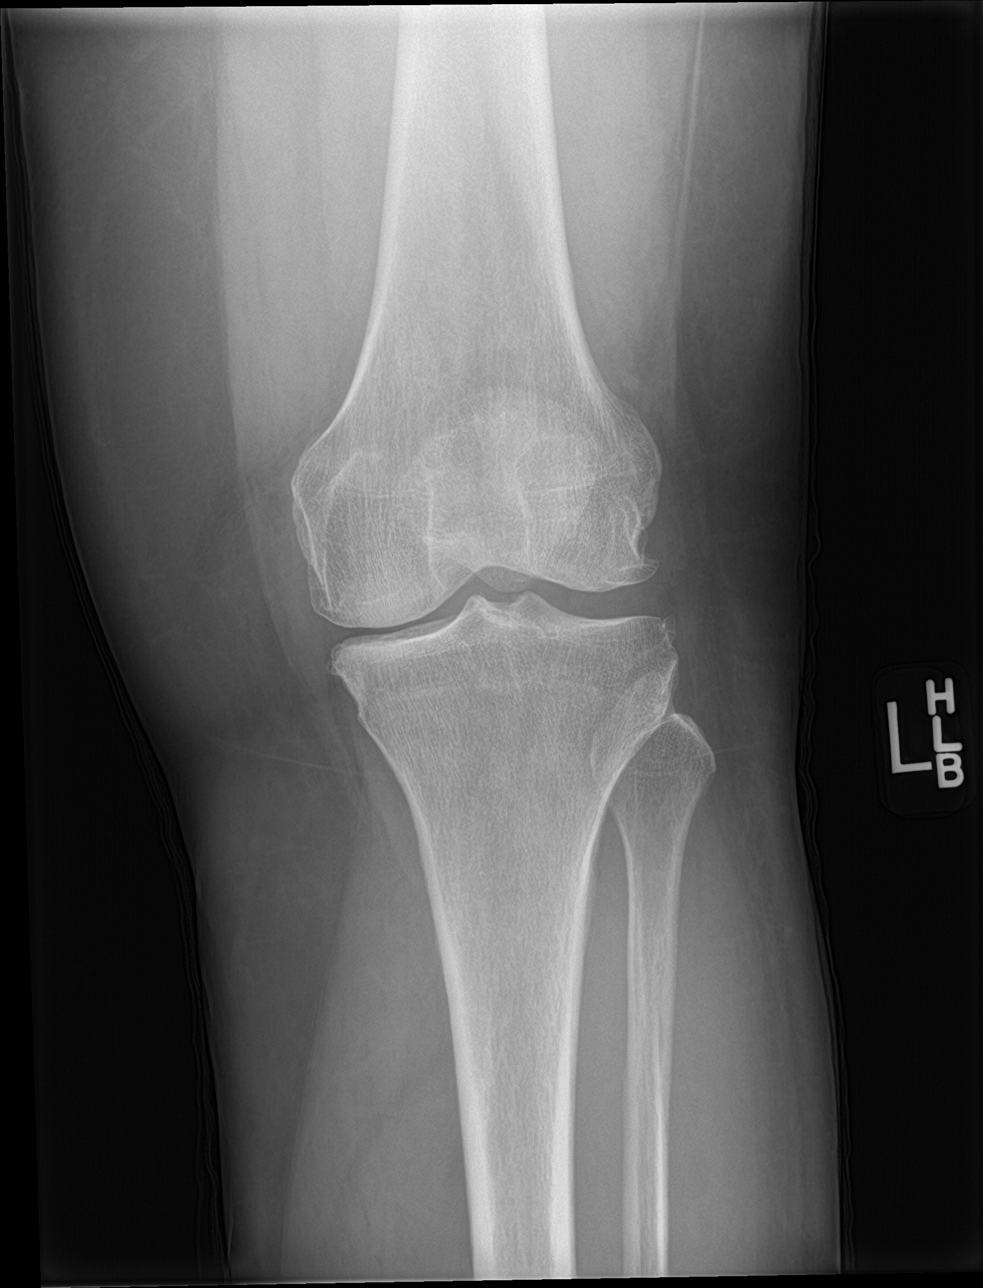

[knee lat]
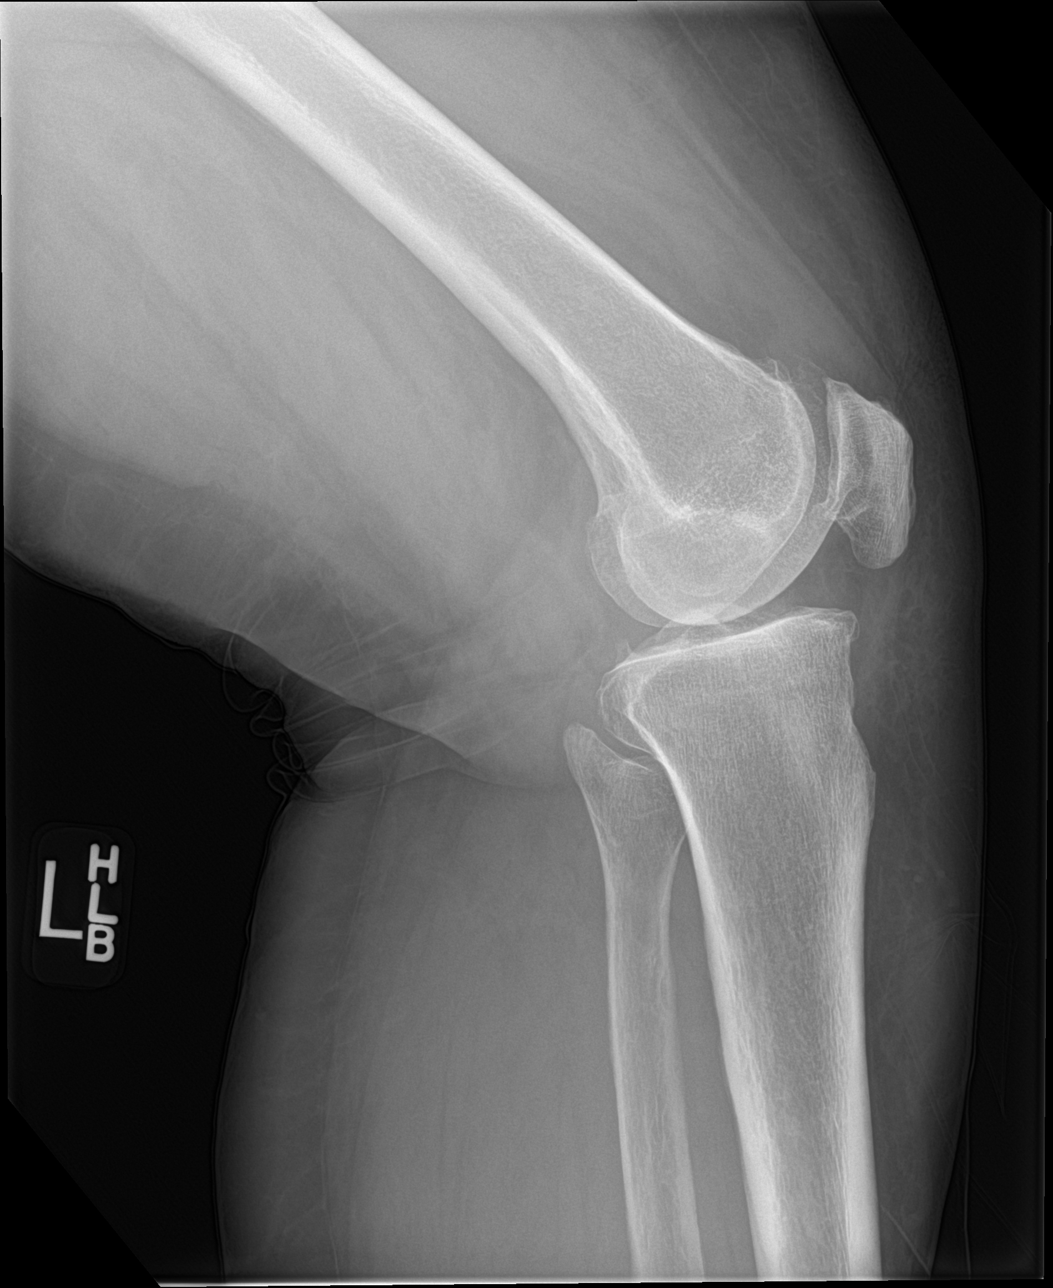

[knee obl (1 of 2)]
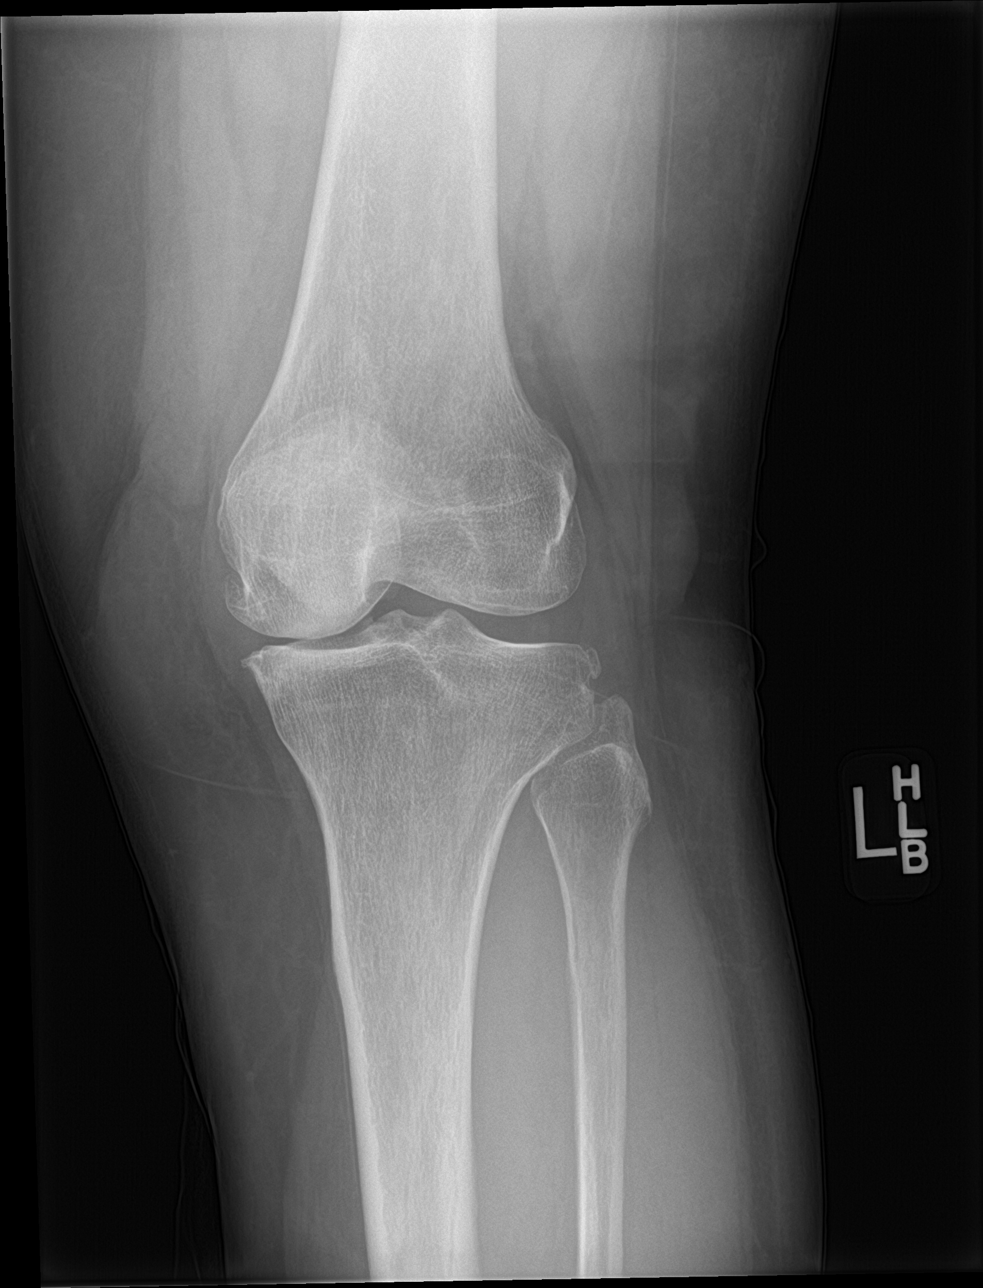

[knee obl (2 of 2)]
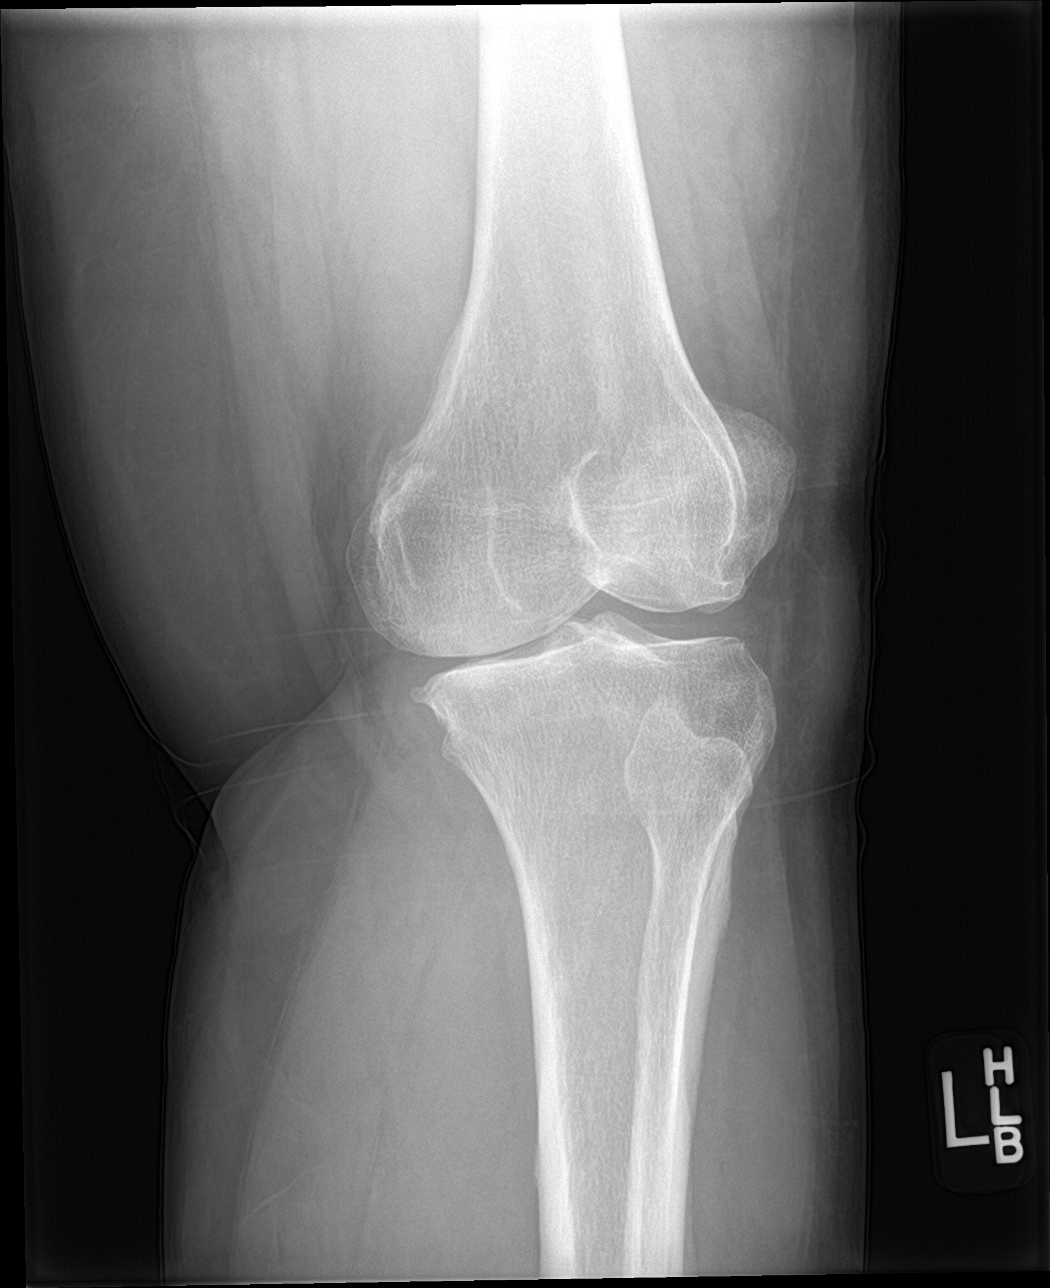

[4 of 4 positions shown; findings below may reference images not displayed]

FINDINGS: Joint space narrowing in the medial patellofemoral compartment with
spurring. No joint effusion. No acute bony abnormality.
Specifically, no fracture, subluxation, or dislocation.
IMPRESSION: Mild degenerative changes.  No acute bony abnormality.

## 2023-06-01 IMAGING — DX DG SHOULDER 2+V*R*
3 series · 3 of 3 positions shown · non-contrast
Comparison: None.

CLINICAL DATA: Fall, pain

EXAM:
RIGHT SHOULDER - 2+ VIEW

[shoulder grashey]
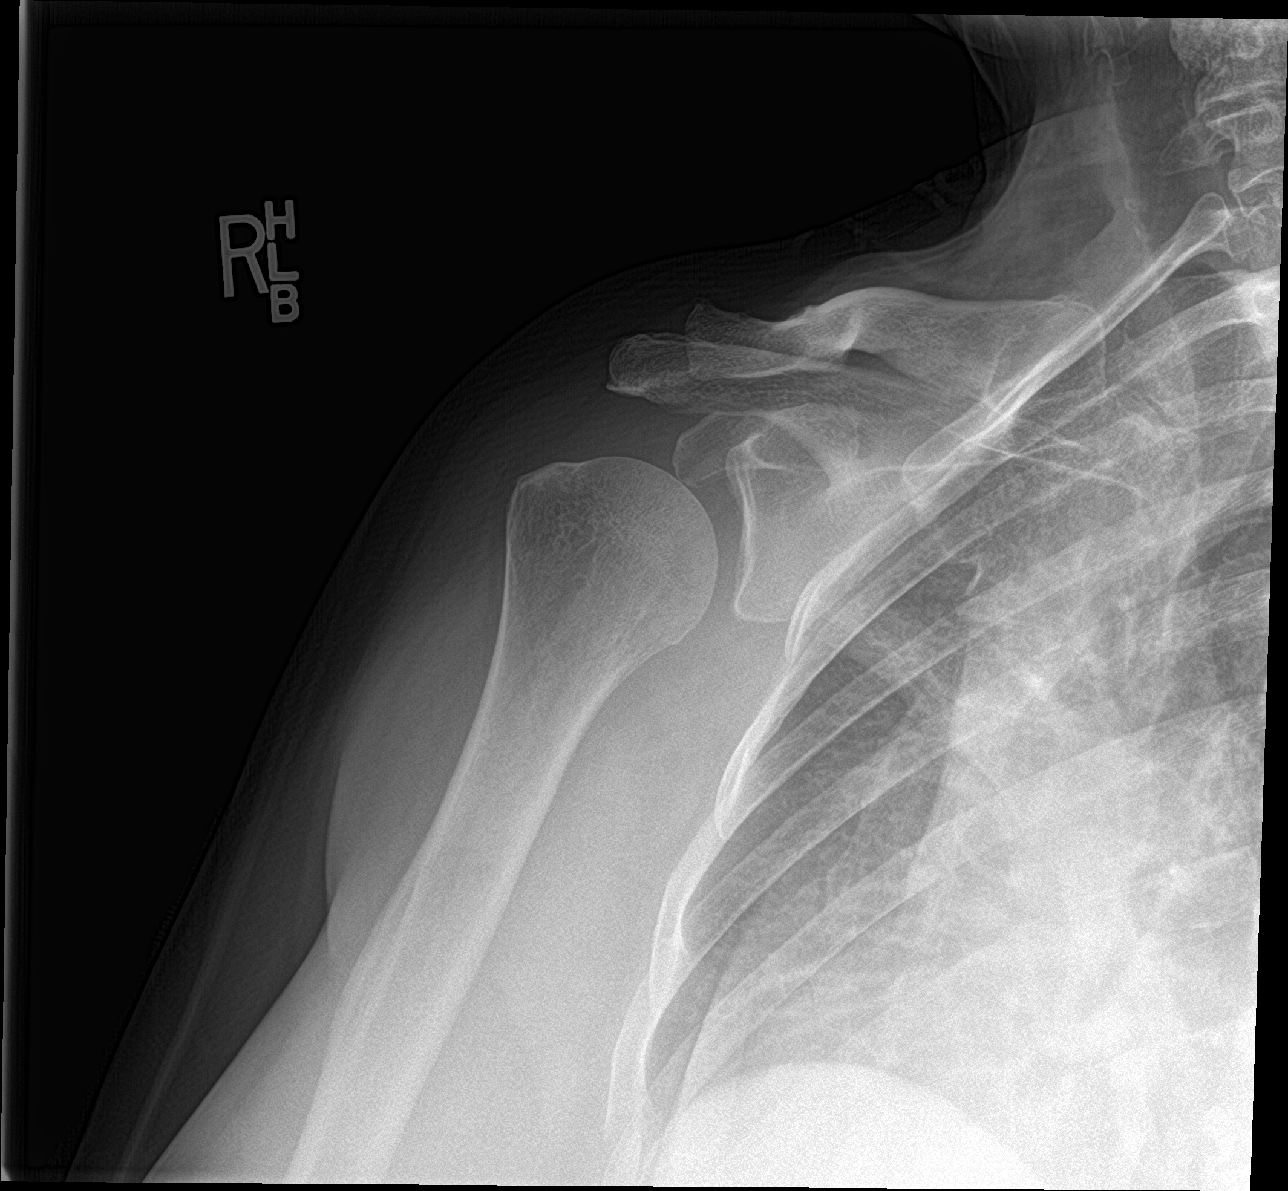

[shoulder y view]
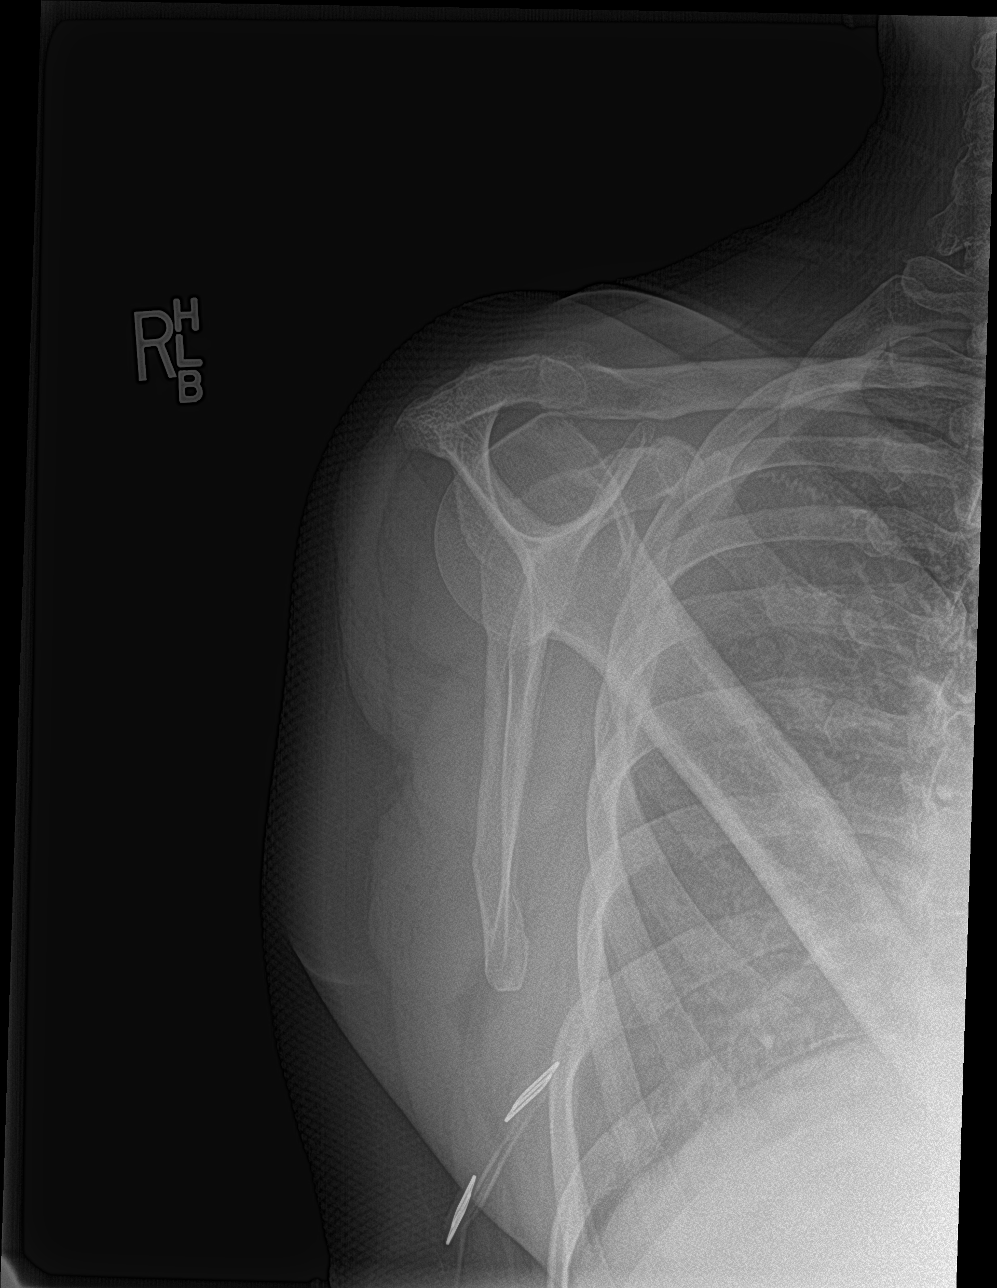

[shoulder axillary]
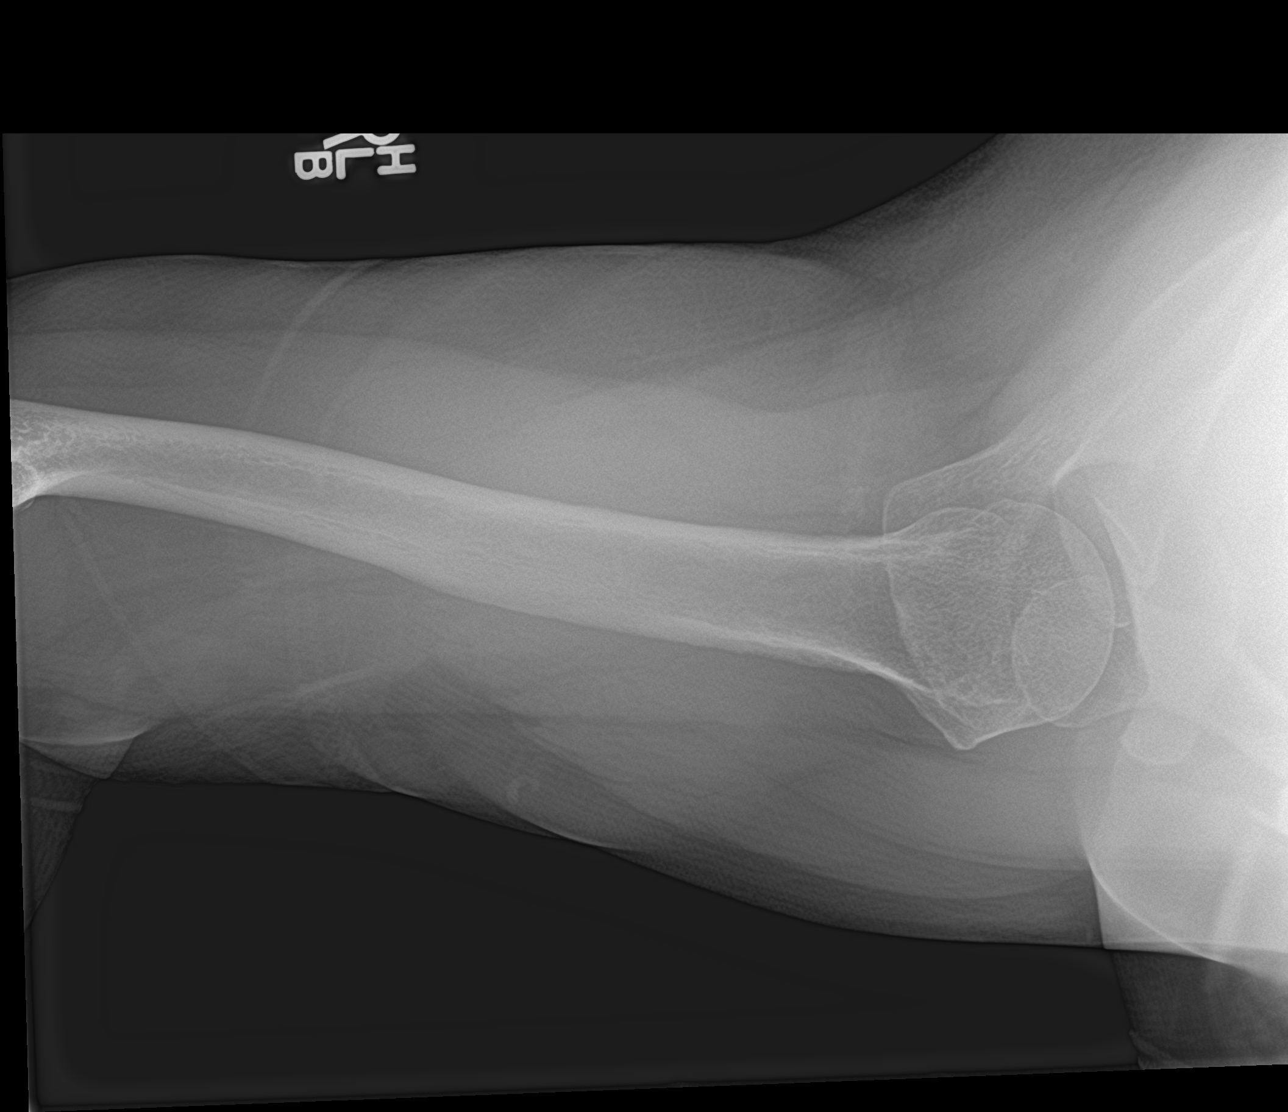

[3 of 3 positions shown; findings below may reference images not displayed]

FINDINGS: There is no evidence of fracture or dislocation. There is no
evidence of arthropathy or other focal bone abnormality. Soft
tissues are unremarkable.
IMPRESSION: Negative.

## 2023-06-02 ENCOUNTER — Ambulatory Visit: Payer: PRIVATE HEALTH INSURANCE | Admitting: Physical Therapy

## 2023-06-02 DIAGNOSIS — M25562 Pain in left knee: Secondary | ICD-10-CM

## 2023-06-02 DIAGNOSIS — R262 Difficulty in walking, not elsewhere classified: Secondary | ICD-10-CM

## 2023-06-02 DIAGNOSIS — M6281 Muscle weakness (generalized): Secondary | ICD-10-CM

## 2023-06-02 DIAGNOSIS — M25662 Stiffness of left knee, not elsewhere classified: Secondary | ICD-10-CM

## 2023-06-02 NOTE — Therapy (Signed)
 OUTPATIENT PHYSICAL THERAPY LOWER EXTREMITY    Patient Name: Abigail Wright MRN: [id number] DOB:[birth year], 58 y.o., female Today's Date: 06/02/2023  END OF SESSION:  PT End of Session - 06/02/23 1010     Visit Number 5    Number of Visits 17    Date for PT Re-Evaluation 07/13/23    Authorization Type Generic Commerical    Authorization Time Period 05/18/23 to 07/13/23    PT Start Time 1013    PT Stop Time 1100    PT Time Calculation (min) 47 min             Past Medical History:  Diagnosis Date   ADD (attention deficit disorder)    Anxiety    Arthritis    Blood transfusion without reported diagnosis    1993   Depression    GERD (gastroesophageal reflux disease)    in past but recently controlled with diet and exercise.   Hyperlipidemia    Hypertension    Pneumonia    Past Surgical History:  Procedure Laterality Date   ABDOMINAL HYSTERECTOMY  1993   BUNIONECTOMY Left    CARPAL TUNNEL RELEASE Bilateral    cyst removal from left hand Bilateral    FOOT SURGERY Left    plantar fascicitis surgery   KNEE CARTILAGE SURGERY Left    SHOULDER ARTHROSCOPY WITH SUBACROMIAL DECOMPRESSION AND OPEN ROTATOR C Right 07/07/2021   Procedure: RIGHT SHOULDER ARTHROSCOPY WITH SUBACROMIAL DECOMPRESSION / ROTATOR CUFF REPAIR/ DISTAL CLAVICLE RESECTION /BICEPS TENODESIS;  Surgeon: Valeria Batman, MD;  Location: [facility];  Service: Orthopedics;  Laterality: Right;   TENDON REPAIR Left 03/21/2017   Left hand    TONSILLECTOMY     TOTAL KNEE ARTHROPLASTY Left 05/13/2023   Procedure: LEFT TOTAL KNEE ARTHROPLASTY;  Surgeon: Kathryne Hitch, MD;  Location: [facility];  Service: Orthopedics;  Laterality: Left;   Patient Active Problem List   Diagnosis Date Noted   Status post total left knee replacement 05/13/2023   Chronic heel pain, right 01/27/2023   Pain in right knee 08/04/2021   Olecranon bursitis, right elbow 08/04/2021   Complete tear of right rotator cuff 07/14/2021    Impingement syndrome of right shoulder 06/17/2021   Major depressive disorder, recurrent episode, moderate (HCC) 03/06/2021   Pain in left knee 11/04/2017   Benign paroxysmal positional vertigo of left ear 05/15/2015   Family history of breast cancer in mother 08/02/2011    PCP: Sharlene Dory DO   REFERRING PROVIDER: Kathryne Hitch, MD  REFERRING DIAG: [phone] (ICD-10-CM) - Status post total left knee replacement M17.12 (ICD-10-CM) - Unilateral primary osteoarthritis, left knee  THERAPY DIAG:  Acute pain of left knee  Muscle weakness (generalized)  Difficulty in walking, not elsewhere classified  Stiffness of left knee, not elsewhere classified  Rationale for Evaluation and Treatment: Rehabilitation  ONSET DATE:  surgery 05/13/23  SUBJECTIVE:   SUBJECTIVE STATEMENT:  amb in without AD doing very well. stiffness  PERTINENT HISTORY: See above  PAIN:  Are you having pain? Stiffness  PRECAUTIONS: Fall  RED FLAGS: None   WEIGHT BEARING RESTRICTIONS: No  FALLS:  Has patient fallen in last 6 months? No  LIVING ENVIRONMENT: Lives with: lives with their spouse Lives in: House/apartment Stairs: 2 story home, has ramp in the back of the house + 1-2 steps with rail to enter home. 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    PATIENT SURVEYS:    Patient-Specific Activity Scoring Scheme  "0" represents "unable to perform." "10" represents "able to perform at prior level. 0 1 2 3 4  5 6 7 8 9 10  (Date and Score)   Activity Eval     1. Getting onto the floor to play with grandkids  0     2. Stairs  5     3. Standing for a long period of time  5   4.    5.    Score 3.3    Total score = sum of the activity scores/number of activities Minimum detectable change (90%CI) for average score = 2 points Minimum detectable change (90%CI) for single activity score = 3 points     COGNITION: Overall cognitive status: Within functional limits for tasks assessed       EDEMA:   As appropriate given surgery 5 days ago     LOWER EXTREMITY ROM:  Active ROM Left eval  Hip flexion   Hip extension   Hip abduction   Hip adduction   Hip internal rotation   Hip external rotation   Knee flexion 70* supine heel slide   Knee extension 15* supine with heel prop  Ankle dorsiflexion   Ankle plantarflexion   Ankle inversion   Ankle eversion    (Blank rows = not tested)  LOWER EXTREMITY MMT:  MMT Right eval Left eval  Hip flexion 5 3-  Hip extension    Hip abduction    Hip adduction    Hip internal rotation    Hip external rotation    Knee flexion 5 Deferred, knee pain    Knee extension 5 3 in available ROM, did not provide manual resistance due to pain levels   Ankle dorsiflexion    Ankle plantarflexion    Ankle inversion    Ankle eversion     (Blank rows = not tested)    FUNCTIONAL TESTS:  5 times sit to stand: 13 seconds no UEs high mat table  Timed up and go (TUG): 28 seconds RW  3 minute walk test: 326ft RW   GAIT: Distance walked: 355ft  Assistive device utilized: Environmental consultant - 2 wheeled Level of assistance: Modified independence Comments: limited L knee ROM with gait cycle, heavy reliance on RW with UEs, mildly antalgic                                                                                                                                 TREATMENT DATE:   06/02/23 Nustep L 5 LE  only HS curl LLE 20# 2 sets 10 LAQ LLE 5# 2 sets 10  hold 3 sec at TKE 6 inch step up LLE 10 x fwd,10 x laterally 4 inch step down with LLE 10 x Bike L 2 Standing on airex 5# ankle wt on RT step tap 15 x fwd,15 x laterally, then alt, fwd 20 x CGA 5# standing HS curls STS on airex with wt ball 10 x PROM flexion Floor transfer had pt demo with cuing for best tech      05/31/23 Bike L 2 6 min Pass flexion STW to TP in quad Act flexion 108 Pass Flexion 116 HS curl 20# 2 sets 10 Knee ext 5# LLE only  2 sets 10 Leg Press 30# 2 sets 10  LLE 10x 20# Calf raises 30# 2 sets 12 Resisted gait 30# fwd 10 x then added 6 inch step up  05/27/23 Nustep L 5 HS curl LLE 15# 2 sets 10 Knee ext 5# LLE only 2 sets 10 Leg Press 20# BIL 10 x, LLE only 10 x, LLE unlocked for ROM 10 x Calf raises 30# 2 sets 10 30# resisted gait 5 x fwd,5 x backward, 3 x each side Green tband TKE 2 sets 10 Patellar mobs, Pass flex and ext Act flexion after 100   05/24/23 Nustep L 4 - educ on elevation above heart with ice to help sweling Quad set 10 x Quad set with SAQ 10 x LAQ 10 x  2 sets HS curl red tband 2 sets 10 AROM sitting 5-90 PROM flex and ext Gait training with SPC -working on stride,step through,sequncing and heel strike Slant board Black bar heel raises  and toe raises 10x 2 sets  Leg press 20# BIL 2 sets 10      05/18/23  Eval, POC, HEP- encouraged regular icing with ice man at home, use of compression stockings, continue with hospital HEP, no need to wear KI at this point, correct height/fit for RW, signs of over-doing    Bike for ROM x5 min at EOS, partial rotations with holds for flexion stretch    PATIENT EDUCATION:  Education details: see above  Person educated: Patient and Spouse Education method: Explanation, Demonstration, and Handouts Education comprehension: verbalized understanding, returned demonstration, and needs further education  HOME EXERCISE PROGRAM: Hospital HEP   ASSESSMENT:  CLINICAL IMPRESSION:  pt continues to be doing very well. Progressed functional strengthening. Step down and dynamic surfaces are challenging for patient. Pt would continue to benefit from skilled therapy and adding in SLS,balance and dynamic surfaces. OBJECTIVE IMPAIRMENTS: Abnormal gait, decreased activity tolerance, decreased balance, decreased knowledge of use of DME, decreased mobility, difficulty walking, decreased ROM, decreased strength, increased edema, increased fascial restrictions, impaired flexibility, obesity, and pain.   ACTIVITY LIMITATIONS: sitting, standing, squatting, stairs, transfers, and locomotion level  PARTICIPATION LIMITATIONS: driving, shopping, community activity, occupation, and yard work  PERSONAL FACTORS: Age, Education, Fitness, Past/current experiences, Social background, and Time since onset of injury/illness/exacerbation are also affecting patient's functional outcome.   REHAB POTENTIAL: Good  CLINICAL DECISION MAKING: Stable/uncomplicated  EVALUATION COMPLEXITY: Low   GOALS: Goals reviewed with patient? No  SHORT TERM GOALS: Target date: 06/15/2023   Will be compliant with appropriate progressive HEP Goal status: 05/27/23 MET   2. L knee AROM flexion to be at least 100* and extension AROM to be no more than 5*  Goal status: 05/27/23 progressing   05/30/23 MET   3. Will be independent with edema management  strategies  Goal status: 05/27/23 MET   4. Gait pattern to have normalized with LRAD Goal status:progressing 05/27/23   LONG TERM GOALS: Target date: 07/13/2023    MMT to have improved by one grade all weak groups Goal status: initial   2. Knee AROM to be WNL and pain free Goal status: initial    3. Will be able to ascend and descend stairs reciprocally with no increase in pain Goal status: initial   4. Will be able to ambulate community distances and perform all household tasks with no increase in pain  Goal status: initial   5. PSFS  to have improved by at  least 3 points to show improved QOL and subjective improvement  Goal status: initial   6. Will be able to get down to the floor to play with and take care of grand-kids Goal status: initial     PLAN:  PT FREQUENCY: 2x/week  PT DURATION: 8 weeks  PLANNED INTERVENTIONS: 97164- PT Re-evaluation, 97110-Therapeutic exercises, 97530- Therapeutic activity, 97112- Neuromuscular re-education, 97535- Self Care, 09811- Manual therapy, [id number]- Gait training, [phone]- Orthotic Fit/training, [phone]- Vasopneumatic device, Patient/Family education, Balance training, Stair training, Taping, Dry Needling, Cryotherapy, and Moist heat  PLAN FOR NEXT SESSION: SLS, step ups/downs,balance, dynamic surfaces. 3 more weeks of PT and D/C . She is doing very well just really maximizing func over next 3 weeks as she is young and very active.   Patient Details  Name: Geet Hosking MRN: [id number] Date of Birth: Sep 25, 1965 Referring Provider:  Kathryne Hitch*  Encounter Date: 06/02/2023   Suanne Marker, PTA 06/02/2023, 10:11 AM  [facility] [address] [place], [place], [address] Phone: [phone]   Fax:  [phone] [address]. [place], [place], [address] Phone: [phone]   Fax:  [phone]  Patient Details  Name: Miranda Frese MRN: [id number] Date of Birth: [birth year] Referring Provider:  [facility]. [place], [place], [address] Phone: [phone]   Fax:  [phone]

## 2023-06-07 ENCOUNTER — Encounter: Payer: Self-pay | Admitting: Physical Therapy

## 2023-06-07 ENCOUNTER — Ambulatory Visit: Payer: PRIVATE HEALTH INSURANCE | Admitting: Physical Therapy

## 2023-06-07 DIAGNOSIS — R262 Difficulty in walking, not elsewhere classified: Secondary | ICD-10-CM

## 2023-06-07 DIAGNOSIS — R600 Localized edema: Secondary | ICD-10-CM

## 2023-06-07 DIAGNOSIS — M25562 Pain in left knee: Secondary | ICD-10-CM

## 2023-06-07 DIAGNOSIS — M6281 Muscle weakness (generalized): Secondary | ICD-10-CM

## 2023-06-07 DIAGNOSIS — M25662 Stiffness of left knee, not elsewhere classified: Secondary | ICD-10-CM

## 2023-06-07 NOTE — Therapy (Signed)
 OUTPATIENT PHYSICAL THERAPY LOWER EXTREMITY    Patient Name: Abigail Wright MRN: [id number] DOB:Apr 03, 1965, 58 y.o., female Today's Date: 06/07/2023  END OF SESSION:  PT End of Session - 06/07/23 1156     Visit Number 6    Number of Visits 17    Date for PT Re-Evaluation 07/13/23    Authorization Type Generic Commerical    Authorization Time Period 05/18/23 to 07/13/23    PT Start Time 1147    PT Stop Time 1226    PT Time Calculation (min) 39 min    Activity Tolerance Patient tolerated treatment well    Behavior During Therapy WFL for tasks assessed/performed              Past Medical History:  Diagnosis Date   ADD (attention deficit disorder)    Anxiety    Arthritis    Blood transfusion without reported diagnosis    1993   Depression    GERD (gastroesophageal reflux disease)    in past but recently controlled with diet and exercise.   Hyperlipidemia    Hypertension    Pneumonia    Past Surgical History:  Procedure Laterality Date   ABDOMINAL HYSTERECTOMY  1993   BUNIONECTOMY Left    CARPAL TUNNEL RELEASE Bilateral    cyst removal from left hand Bilateral    FOOT SURGERY Left    plantar fascicitis surgery   KNEE CARTILAGE SURGERY Left    SHOULDER ARTHROSCOPY WITH SUBACROMIAL DECOMPRESSION AND OPEN ROTATOR C Right 07/07/2021   Procedure: RIGHT SHOULDER ARTHROSCOPY WITH SUBACROMIAL DECOMPRESSION / ROTATOR CUFF REPAIR/ DISTAL CLAVICLE RESECTION /BICEPS TENODESIS;  Surgeon: Shirlee Dotter, MD;  Location: [facility];  Service: Orthopedics;  Laterality: Right;   TENDON REPAIR Left 03/21/2017   Left hand    TONSILLECTOMY     TOTAL KNEE ARTHROPLASTY Left 05/13/2023   Procedure: LEFT TOTAL KNEE ARTHROPLASTY;  Surgeon: Arnie Lao, MD;  Location: [facility];  Service: Orthopedics;  Laterality: Left;   Patient Active Problem List   Diagnosis Date Noted   Status post total left knee replacement 05/13/2023   Chronic heel pain, right 01/27/2023   Pain in  right knee 08/04/2021   Olecranon bursitis, right elbow 08/04/2021   Complete tear of right rotator cuff 07/14/2021   Impingement syndrome of right shoulder 06/17/2021   Major depressive disorder, recurrent episode, moderate (HCC) 03/06/2021   Pain in left knee 11/04/2017   Benign paroxysmal positional vertigo of left ear 05/15/2015   Family history of breast cancer in mother 08/02/2011    PCP: Jobe Mulder DO   REFERRING PROVIDER: Arnie Lao, MD  REFERRING DIAG: [phone] (ICD-10-CM) - Status post total left knee replacement M17.12 (ICD-10-CM) - Unilateral primary osteoarthritis, left knee  THERAPY DIAG:  Acute pain of left knee  Muscle weakness (generalized)  Difficulty in walking, not elsewhere classified  Stiffness of left knee, not elsewhere classified  Localized edema  Rationale for Evaluation and Treatment: Rehabilitation  ONSET DATE:  surgery 05/13/23  SUBJECTIVE:   SUBJECTIVE STATEMENT:     Doing well, biggest thing is just being careful about overdoing. Still having some trouble getting to sleep at night. Doing the steps on the deck OK no issues.   PERTINENT HISTORY: See above  PAIN:  Are you having pain? 0/10 now can get to 4/10 on busy days   PRECAUTIONS: Fall  RED FLAGS: None   WEIGHT BEARING RESTRICTIONS: No  FALLS:  Has patient fallen in last 6 months?  No  LIVING ENVIRONMENT: Lives with: lives with their spouse Lives in: House/apartment Stairs: 2 story home, has ramp in the back of the house + 1-2 steps with rail to enter home. 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    PATIENT SURVEYS:    Patient-Specific Activity Scoring Scheme  "0" represents "unable to perform." "10" represents "able to perform at prior level. 0 1 2 3 4 5 6 7 8 9  10 (Date and Score)   Activity Eval     1. Getting onto the floor to play with grandkids  0     2. Stairs  5     3. Standing for a long period of time  5   4.    5.    Score 3.3    Total score = sum of the activity scores/number of activities Minimum detectable change (90%CI) for average score = 2 points Minimum detectable change (90%CI) for single activity score = 3 points     COGNITION: Overall cognitive status: Within functional limits for tasks assessed       EDEMA:   As appropriate given surgery 5 days ago     LOWER EXTREMITY ROM:  Active ROM Left eval Left 06/07/23  Hip flexion    Hip extension    Hip abduction    Hip adduction    Hip internal rotation    Hip external rotation    Knee flexion 70* supine heel slide  105* seated flexion AROM   Knee extension 15* supine with heel prop 6* LAQ, mm tremulous and weak   Ankle dorsiflexion    Ankle plantarflexion    Ankle inversion    Ankle eversion     (Blank rows = not tested)  LOWER EXTREMITY MMT:  MMT Right eval Left eval  Hip flexion 5 3-  Hip extension    Hip abduction    Hip adduction    Hip internal rotation    Hip external rotation    Knee flexion 5 Deferred, knee pain    Knee extension 5 3 in available ROM, did not provide manual resistance due to pain levels   Ankle dorsiflexion    Ankle plantarflexion    Ankle inversion    Ankle eversion     (Blank rows = not tested)    FUNCTIONAL TESTS:  5 times sit to stand: 13 seconds no UEs high mat table  Timed up and go (TUG): 28 seconds RW  3 minute walk test: 354ft RW   GAIT: Distance walked: 368ft  Assistive device utilized: Environmental consultant - 2  wheeled Level of assistance: Modified independence Comments: limited L knee ROM with gait cycle, heavy reliance on RW with UEs, mildly antalgic  TREATMENT DATE:    06/07/23  Scifit bike L2.5 x8 minutes seat 12 --> 10 full rotations  ROM check Forward step ups  6 inch step x12 B Knee flexion stretch on steps 10x5 seconds L  Lateral step ups 6 inch step x12 B  Forward step downs 4 inch step x12 B LAQs 5# x15, then 7# x10, then 9# x5     SLS on foam pad 3x30 seconds cues not to lock out knees  Forward/backward tandem walks on foam x5 rounds // bars Side steps on blue foam pad with alternating toe taps x4 laps     06/02/23 Nustep L 5 LE only HS curl LLE 20# 2 sets 10 LAQ LLE 5# 2 sets 10 hold 3 sec at TKE 6 inch step up LLE 10 x fwd,10 x laterally 4 inch step down with LLE 10 x Bike L 2 Standing on airex 5# ankle wt on RT step tap 15 x fwd,15 x laterally, then alt, fwd 20 x CGA 5# standing HS curls STS on airex with wt ball 10 x PROM flexion Floor transfer had pt demo with cuing for best tech      05/31/23 Bike L 2 6 min Pass flexion STW to TP in quad Act flexion 108 Pass Flexion 116 HS curl 20# 2 sets 10 Knee ext 5# LLE only  2 sets 10 Leg Press 30# 2 sets 10  LLE 10x 20# Calf raises 30# 2 sets 12 Resisted gait 30# fwd 10 x then added 6 inch step up  05/27/23 Nustep L 5 HS curl LLE 15# 2 sets 10 Knee ext 5# LLE only 2 sets 10 Leg Press 20# BIL 10 x, LLE only 10 x, LLE unlocked for ROM 10 x Calf raises 30# 2 sets 10 30# resisted gait 5 x fwd,5 x backward, 3 x each side Green tband TKE 2 sets 10 Patellar mobs, Pass flex and ext Act flexion after 100   05/24/23 Nustep L 4 - educ on elevation above heart with ice to help sweling Quad set 10 x Quad set with SAQ 10 x LAQ 10 x  2 sets HS curl red tband 2 sets 10 AROM  sitting 5-90 PROM flex and ext Gait training with SPC -working on stride,step through,sequncing and heel strike Slant board Black bar heel raises  and toe raises 10x 2 sets  Leg press 20# BIL 2 sets 10      05/18/23  Eval, POC, HEP- encouraged regular icing with ice man at home, use of compression stockings, continue with hospital HEP, no need to wear KI at this point, correct height/fit for RW, signs of over-doing    Bike for ROM x5 min at EOS, partial rotations with holds for flexion stretch    PATIENT EDUCATION:  Education details: see above  Person educated: Patient and Spouse Education method: Explanation, Facilities manager, and Handouts Education comprehension: verbalized understanding, returned demonstration, and needs further education  HOME EXERCISE PROGRAM: Hospital HEP   ASSESSMENT:  CLINICAL IMPRESSION:   Pt arrives today really doing well. She is making excellent progress with PT, we focused more on CKC exercise and advancing balance training today with good tolerance noted. May be able to DC to independent program early, will keep all appts as scheduled and monitor closely.     OBJECTIVE IMPAIRMENTS: Abnormal gait, decreased activity tolerance, decreased balance, decreased knowledge of use of DME, decreased mobility, difficulty walking, decreased ROM, decreased strength, increased edema, increased fascial restrictions, impaired  flexibility, obesity, and pain.   ACTIVITY LIMITATIONS: sitting, standing, squatting, stairs, transfers, and locomotion level  PARTICIPATION LIMITATIONS: driving, shopping, community activity, occupation, and yard work  PERSONAL FACTORS: Age, Education, Fitness, Past/current experiences, Social background, and Time since onset of injury/illness/exacerbation are also affecting patient's functional outcome.   REHAB POTENTIAL: Good  CLINICAL DECISION MAKING: Stable/uncomplicated  EVALUATION COMPLEXITY: Low   GOALS: Goals reviewed with  patient? No  SHORT TERM GOALS: Target date: 06/15/2023   Will be compliant with appropriate progressive HEP Goal status: 05/27/23 MET   2. L knee AROM flexion to be at least 100* and extension AROM to be no more than 5*  Goal status: 05/27/23 progressing   05/30/23 MET   3. Will be independent with edema management strategies  Goal status: 05/27/23 MET   4. Gait pattern to have normalized with LRAD Goal status:progressing 05/27/23   LONG TERM GOALS: Target date: 07/13/2023    MMT to have improved by one grade all weak groups Goal status: initial   2. Knee AROM to be WNL and pain free Goal status: initial    3. Will be able to ascend and descend stairs reciprocally with no increase in pain Goal status: initial   4. Will be able to ambulate community distances and perform all household tasks with no increase in pain  Goal status: initial   5. PSFS  to have improved by at least 3 points to show improved QOL and subjective improvement  Goal status: initial   6. Will be able to get down to the floor to play with and take care of grand-kids Goal status: initial     PLAN:  PT FREQUENCY: 2x/week  PT DURATION: 8 weeks  PLANNED INTERVENTIONS: 97164- PT Re-evaluation, 97110-Therapeutic exercises, 97530- Therapeutic activity, 97112- Neuromuscular re-education, 97535- Self Care, 52841- Manual therapy, [id number]- Gait training, [phone]- Orthotic Fit/training, [phone]- Vasopneumatic device, Patient/Family education, Balance training, Stair training, Taping, Dry Needling, Cryotherapy, and Moist heat  PLAN FOR NEXT SESSION: SLS, step ups/downs,balance, dynamic surfaces. 3 more weeks of PT and D/C . She is doing very well just really maximizing func over next 3 weeks as she is young and very active. Sees MD May 1st, may be ready for early DC      Terrel Ferries, [place], DPT 06/07/23 12:26 PM

## 2023-06-08 ENCOUNTER — Encounter: Payer: Self-pay | Admitting: Physical Therapy

## 2023-06-08 ENCOUNTER — Ambulatory Visit: Payer: PRIVATE HEALTH INSURANCE | Admitting: Physical Therapy

## 2023-06-08 DIAGNOSIS — M6281 Muscle weakness (generalized): Secondary | ICD-10-CM

## 2023-06-08 DIAGNOSIS — R600 Localized edema: Secondary | ICD-10-CM

## 2023-06-08 DIAGNOSIS — M25562 Pain in left knee: Secondary | ICD-10-CM

## 2023-06-08 DIAGNOSIS — R262 Difficulty in walking, not elsewhere classified: Secondary | ICD-10-CM

## 2023-06-08 DIAGNOSIS — M25662 Stiffness of left knee, not elsewhere classified: Secondary | ICD-10-CM

## 2023-06-08 NOTE — Therapy (Signed)
 OUTPATIENT PHYSICAL THERAPY LOWER EXTREMITY    Patient Name: Abigail Wright MRN: [id number] DOB:February 15, 1966, 58 y.o., female Today's Date: 06/08/2023  END OF SESSION:  PT End of Session - 06/08/23 1114     Visit Number 7    Number of Visits 17    Date for PT Re-Evaluation 07/13/23    Authorization Type Generic Commerical    Authorization Time Period 05/18/23 to 07/13/23    PT Start Time 1100    PT Stop Time 1141    PT Time Calculation (min) 41 min    Activity Tolerance Patient tolerated treatment well    Behavior During Therapy [facility] for tasks assessed/performed               Past Medical History:  Diagnosis Date   ADD (attention deficit disorder)    Anxiety    Arthritis    Blood transfusion without reported diagnosis    1993   Depression    GERD (gastroesophageal reflux disease)    in past but recently controlled with diet and exercise.   Hyperlipidemia    Hypertension    Pneumonia    Past Surgical History:  Procedure Laterality Date   ABDOMINAL HYSTERECTOMY  1993   BUNIONECTOMY Left    CARPAL TUNNEL RELEASE Bilateral    cyst removal from left hand Bilateral    FOOT SURGERY Left    plantar fascicitis surgery   KNEE CARTILAGE SURGERY Left    SHOULDER ARTHROSCOPY WITH SUBACROMIAL DECOMPRESSION AND OPEN ROTATOR C Right 07/07/2021   Procedure: RIGHT SHOULDER ARTHROSCOPY WITH SUBACROMIAL DECOMPRESSION / ROTATOR CUFF REPAIR/ DISTAL CLAVICLE RESECTION /BICEPS TENODESIS;  Surgeon: Shirlee Dotter, MD;  Location: [facility];  Service: Orthopedics;  Laterality: Right;   TENDON REPAIR Left 03/21/2017   Left hand    TONSILLECTOMY     TOTAL KNEE ARTHROPLASTY Left 05/13/2023   Procedure: LEFT TOTAL KNEE ARTHROPLASTY;  Surgeon: Arnie Lao, MD;  Location: [facility];  Service: Orthopedics;  Laterality: Left;   Patient Active Problem List   Diagnosis Date Noted   Status post total left knee replacement 05/13/2023   Chronic heel pain, right 01/27/2023   Pain  in right knee 08/04/2021   Olecranon bursitis, right elbow 08/04/2021   Complete tear of right rotator cuff 07/14/2021   Impingement syndrome of right shoulder 06/17/2021   Major depressive disorder, recurrent episode, moderate (HCC) 03/06/2021   Pain in left knee 11/04/2017   Benign paroxysmal positional vertigo of left ear 05/15/2015   Family history of breast cancer in mother 08/02/2011    PCP: Jobe Mulder DO   REFERRING PROVIDER: Arnie Lao, MD  REFERRING DIAG: [phone] (ICD-10-CM) - Status post total left knee replacement M17.12 (ICD-10-CM) - Unilateral primary osteoarthritis, left knee  THERAPY DIAG:  Acute pain of left knee  Muscle weakness (generalized)  Difficulty in walking, not elsewhere classified  Stiffness of left knee, not elsewhere classified  Localized edema  Rationale for Evaluation and Treatment: Rehabilitation  ONSET DATE:  surgery 05/13/23  SUBJECTIVE:   SUBJECTIVE STATEMENT:     Little extra sore after that pop last session but  no extra swelling, otherwise feeling good   PERTINENT HISTORY: See above  PAIN:  Are you having pain? 2-3/10 L knee, soreness, nothing making it worse and better   PRECAUTIONS: Fall  RED FLAGS: None   WEIGHT BEARING RESTRICTIONS: No  FALLS:  Has patient fallen in last 6 months? No  LIVING ENVIRONMENT: Lives with: lives with their spouse  Lives in: House/apartment Stairs: 2 story home, has ramp in the back of the house + 1-2 steps with rail to enter home. 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    PATIENT SURVEYS:    Patient-Specific Activity Scoring Scheme  "0" represents "unable to perform." "10" represents "able to perform at prior level. 0 1 2 3 4 5 6 7 8 9  10 (Date and Score)   Activity Eval     1. Getting onto the floor to play with grandkids  0     2. Stairs  5     3. Standing for a long period of time  5   4.    5.    Score 3.3    Total score = sum of the activity scores/number of activities Minimum detectable change (90%CI) for average score = 2 points Minimum detectable change (90%CI) for single activity score = 3 points     COGNITION: Overall cognitive status: Within functional limits for tasks assessed       EDEMA:   As appropriate given surgery 5 days ago     LOWER EXTREMITY ROM:  Active ROM Left eval Left 06/07/23  Hip flexion    Hip extension    Hip abduction    Hip adduction    Hip internal rotation    Hip external rotation    Knee flexion 70* supine heel slide  105* seated flexion AROM   Knee extension 15* supine with heel prop 6* LAQ, mm tremulous and weak   Ankle dorsiflexion    Ankle plantarflexion    Ankle inversion    Ankle eversion     (Blank rows = not tested)  LOWER EXTREMITY MMT:  MMT Right eval Left eval  Hip flexion 5 3-  Hip extension    Hip abduction    Hip adduction    Hip internal rotation    Hip external rotation    Knee flexion 5 Deferred, knee pain    Knee extension 5 3 in available ROM, did not provide manual resistance due to pain levels   Ankle dorsiflexion    Ankle plantarflexion    Ankle inversion    Ankle eversion     (Blank rows = not tested)    FUNCTIONAL TESTS:  5 times sit to stand: 13 seconds no UEs high mat table  Timed up and go (TUG): 28 seconds RW  3 minute walk test: 316ft RW   GAIT: Distance walked: 352ft  Assistive device utilized: Environmental consultant - 2 wheeled Level of assistance: Modified  independence Comments: limited L knee ROM with gait cycle, heavy reliance on RW with UEs, mildly antalgic  TREATMENT DATE:   06/08/23  Scifit bike seat 12--> 10 for 10 minutes for w/u and ROM Wall squats x10 self selected depth cues for midline/equal WB Forward step ups 6 inch step x15 B HS stretch 3x30 L seconds on steps Knee flexion stretch on 12 inch step x5 second holds   Double step tap on blue foam pad x10 B Cross midline taps to targets from blue foam x10 B multi level   Retrograde massage for edema control Roller to L quads (percussion gun not available)      PATIENT EDUCATION:  Education details: see above  Person educated: Patient and Spouse Education method: Programmer, multimedia, Demonstration, and Handouts Education comprehension: verbalized understanding, returned demonstration, and needs further education  HOME EXERCISE PROGRAM: Hospital HEP   ASSESSMENT:  CLINICAL IMPRESSION:   Pt arrives today really doing well, she is a bit sore after the pop during last session but again continue to feel that this was like a scar tissue adhesion breaking loose. Continued work on strength, ROM, and balance as appropriate today. Added a bit of manual today to try to calm down soreness from yesterday prior to her trip.  Encouraged up and moving in their RV every 30-45 minutes during the trip tomorrow.     OBJECTIVE IMPAIRMENTS: Abnormal gait, decreased activity tolerance, decreased balance, decreased knowledge of use of DME, decreased mobility, difficulty walking, decreased ROM, decreased strength, increased edema, increased fascial restrictions, impaired flexibility, obesity, and pain.   ACTIVITY LIMITATIONS: sitting, standing, squatting, stairs, transfers, and locomotion level  PARTICIPATION LIMITATIONS: driving, shopping, community activity, occupation,  and yard work  PERSONAL FACTORS: Age, Education, Fitness, Past/current experiences, Social background, and Time since onset of injury/illness/exacerbation are also affecting patient's functional outcome.   REHAB POTENTIAL: Good  CLINICAL DECISION MAKING: Stable/uncomplicated  EVALUATION COMPLEXITY: Low   GOALS: Goals reviewed with patient? No  SHORT TERM GOALS: Target date: 06/15/2023   Will be compliant with appropriate progressive HEP Goal status: 05/27/23 MET   2. L knee AROM flexion to be at least 100* and extension AROM to be no more than 5*  Goal status: 05/27/23 progressing   05/30/23 MET   3. Will be independent with edema management strategies  Goal status: 05/27/23 MET   4. Gait pattern to have normalized with LRAD Goal status:progressing 05/27/23   LONG TERM GOALS: Target date: 07/13/2023    MMT to have improved by one grade all weak groups Goal status: initial   2. Knee AROM to be WNL and pain free Goal status: initial    3. Will be able to ascend and descend stairs reciprocally with no increase in pain Goal status: initial   4. Will be able to ambulate community distances and perform all household tasks with no increase in pain  Goal status: initial   5. PSFS  to have improved by at least 3 points to show improved QOL and subjective improvement  Goal status: initial   6. Will be able to get down to the floor to play with and take care of grand-kids Goal status: initial     PLAN:  PT FREQUENCY: 2x/week  PT DURATION: 8 weeks  PLANNED INTERVENTIONS: 97164- PT Re-evaluation, 97110-Therapeutic exercises, 97530- Therapeutic activity, 97112- Neuromuscular re-education, 97535- Self Care, 16109- Manual therapy, [id number]- Gait training, [phone]- Orthotic Fit/training, [phone]- Vasopneumatic device, Patient/Family education, Balance training, Stair training, Taping, Dry Needling, Cryotherapy, and Moist heat  PLAN FOR NEXT SESSION: SLS, step ups/downs,balance, dynamic  surfaces. 3 more weeks of PT and  D/C . She is doing very well just really maximizing func over next 3 weeks as she is young and very active. QUAD STRENGTH. Sees MD May 1st, might be ready for DC at that point. Try elliptical next visit per her request      Terrel Ferries, PT, DPT 06/08/23 11:41 AM

## 2023-06-09 ENCOUNTER — Ambulatory Visit: Payer: PRIVATE HEALTH INSURANCE | Admitting: Physical Therapy

## 2023-06-14 ENCOUNTER — Ambulatory Visit: Payer: PRIVATE HEALTH INSURANCE | Admitting: Physical Therapy

## 2023-06-14 DIAGNOSIS — M25562 Pain in left knee: Secondary | ICD-10-CM | POA: Diagnosis not present

## 2023-06-14 DIAGNOSIS — M6281 Muscle weakness (generalized): Secondary | ICD-10-CM

## 2023-06-14 DIAGNOSIS — R262 Difficulty in walking, not elsewhere classified: Secondary | ICD-10-CM

## 2023-06-14 DIAGNOSIS — M25662 Stiffness of left knee, not elsewhere classified: Secondary | ICD-10-CM

## 2023-06-14 NOTE — Therapy (Signed)
 OUTPATIENT PHYSICAL THERAPY LOWER EXTREMITY    Patient Name: Abigail Wright MRN: [id number] DOB:07/03/1965, 58 y.o., female Today's Date: 06/14/2023  END OF SESSION:  PT End of Session - 06/14/23 1137     Visit Number 8    Number of Visits 17    Date for PT Re-Evaluation 07/13/23    Authorization Type Generic Commerical    Authorization Time Period 05/18/23 to 07/13/23    PT Start Time 1140    PT Stop Time 1225    PT Time Calculation (min) 45 min               Past Medical History:  Diagnosis Date   ADD (attention deficit disorder)    Anxiety    Arthritis    Blood transfusion without reported diagnosis    1993   Depression    GERD (gastroesophageal reflux disease)    in past but recently controlled with diet and exercise.   Hyperlipidemia    Hypertension    Pneumonia    Past Surgical History:  Procedure Laterality Date   ABDOMINAL HYSTERECTOMY  1993   BUNIONECTOMY Left    CARPAL TUNNEL RELEASE Bilateral    cyst removal from left hand Bilateral    FOOT SURGERY Left    plantar fascicitis surgery   KNEE CARTILAGE SURGERY Left    SHOULDER ARTHROSCOPY WITH SUBACROMIAL DECOMPRESSION AND OPEN ROTATOR C Right 07/07/2021   Procedure: RIGHT SHOULDER ARTHROSCOPY WITH SUBACROMIAL DECOMPRESSION / ROTATOR CUFF REPAIR/ DISTAL CLAVICLE RESECTION /BICEPS TENODESIS;  Surgeon: Shirlee Dotter, MD;  Location: [facility];  Service: Orthopedics;  Laterality: Right;   TENDON REPAIR Left 03/21/2017   Left hand    TONSILLECTOMY     TOTAL KNEE ARTHROPLASTY Left 05/13/2023   Procedure: LEFT TOTAL KNEE ARTHROPLASTY;  Surgeon: Arnie Lao, MD;  Location: [facility];  Service: Orthopedics;  Laterality: Left;   Patient Active Problem List   Diagnosis Date Noted   Status post total left knee replacement 05/13/2023   Chronic heel pain, right 01/27/2023   Pain in right knee 08/04/2021   Olecranon bursitis, right elbow 08/04/2021   Complete tear of right rotator cuff  07/14/2021   Impingement syndrome of right shoulder 06/17/2021   Major depressive disorder, recurrent episode, moderate (HCC) 03/06/2021   Pain in left knee 11/04/2017   Benign paroxysmal positional vertigo of left ear 05/15/2015   Family history of breast cancer in mother 08/02/2011    PCP: Jobe Mulder DO   REFERRING PROVIDER: Arnie Lao, MD  REFERRING DIAG: [phone] (ICD-10-CM) - Status post total left knee replacement M17.12 (ICD-10-CM) - Unilateral primary osteoarthritis, left knee  THERAPY DIAG:  Acute pain of left knee  Muscle weakness (generalized)  Difficulty in walking, not elsewhere classified  Stiffness of left knee, not elsewhere classified  Rationale for Evaluation and Treatment: Rehabilitation  ONSET DATE:  surgery 05/13/23  SUBJECTIVE:   SUBJECTIVE STATEMENT:    Doing pretty well.swelling is still an issue.   PERTINENT HISTORY: See above  PAIN:  Are you having pain? 2-3/10 L knee, soreness, nothing making it worse and better   PRECAUTIONS: Fall  RED FLAGS: None   WEIGHT BEARING RESTRICTIONS: No  FALLS:  Has patient fallen in last 6 months? No  LIVING ENVIRONMENT: Lives with: lives with their spouse Lives in: House/apartment Stairs: 2 story home, has ramp in the back of the house + 1-2 steps with rail to enter home. 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    PATIENT SURVEYS:    Patient-Specific Activity Scoring Scheme  "0" represents "unable to  perform." "10" represents "able to perform at prior level. 0 1 2 3 4 5 6 7 8 9  10 (Date and Score)   Activity Eval     1. Getting onto the floor to play with grandkids  0     2. Stairs  5     3. Standing for a long period of time  5   4.    5.    Score 3.3    Total score = sum of the activity scores/number of activities Minimum detectable change (90%CI) for average score = 2 points Minimum detectable change (90%CI) for single activity score = 3 points     COGNITION: Overall cognitive status: Within functional limits for tasks assessed       EDEMA:   As appropriate given surgery 5 days ago     LOWER EXTREMITY ROM:  Active ROM Left eval Left 06/07/23  Hip flexion    Hip extension    Hip abduction    Hip adduction    Hip internal rotation    Hip external rotation    Knee flexion 70* supine heel slide  105* seated flexion AROM   Knee extension 15* supine with heel prop 6* LAQ, mm tremulous and weak   Ankle dorsiflexion    Ankle plantarflexion    Ankle inversion    Ankle eversion     (Blank rows = not tested)  LOWER EXTREMITY MMT:  MMT Right eval Left eval  Hip flexion 5 3-  Hip extension    Hip abduction    Hip adduction    Hip internal rotation    Hip external rotation    Knee flexion 5 Deferred, knee pain    Knee extension 5 3 in available ROM, did not provide manual resistance due to pain levels   Ankle dorsiflexion    Ankle plantarflexion    Ankle inversion    Ankle eversion     (Blank rows = not tested)    FUNCTIONAL TESTS:  5 times sit to stand: 13 seconds no UEs high mat table  Timed up and go (TUG): 28 seconds RW  3 minute walk test: 332ft RW   GAIT: Distance walked: 312ft  Assistive device utilized: Environmental consultant - 2 wheeled Level of assistance: Modified independence Comments: limited L knee ROM with gait cycle, heavy reliance on RW with UEs, mildly antalgic  TREATMENT DATE:   06/14/23 Nustep L 6 Manual resisted TKE AROM seated 0-110 Blue tband HS curl LLE 10 x 5# hip flex-knee ext and lower 10 x LLE 5# standing HS curl 15 x 5# hip 3 way 10 x 5# stepping over 8 in fwd and SW 10 x each BOSU step fwd and laterally 10x SL vector taps Sit fit squats 15 x Leg press DL and SL  . Calf raises 50# 2 sets 15 KNee ext SL 10 # 10 x HS 20# 10x SL  06/08/23  Scifit bike seat 12--> 10 for 10 minutes for w/u and ROM Wall squats x10 self selected depth cues for midline/equal WB Forward step ups 6 inch step x15 B HS stretch 3x30 L seconds on steps Knee flexion stretch on 12 inch step x5 second holds   Double step tap on blue foam pad x10 B Cross midline taps to targets from blue foam x10 B multi level   Retrograde massage for edema control Roller to L quads (percussion gun not available)      PATIENT EDUCATION:  Education details: see above  Person educated: Patient and Spouse Education method: Explanation, Demonstration, and Handouts Education comprehension: verbalized understanding, returned demonstration, and needs further education  HOME EXERCISE PROGRAM: Hospital HEP   ASSESSMENT:  CLINICAL IMPRESSION:   Pt arrives today really doing well, just needs to focus on strength.3 more visits - will focus on strength and est HEP, discussed using elliptical and she is to check on ankle wts  OBJECTIVE IMPAIRMENTS: Abnormal gait, decreased activity tolerance, decreased balance, decreased knowledge of use of DME, decreased mobility, difficulty walking, decreased ROM, decreased strength, increased edema, increased fascial restrictions, impaired flexibility, obesity, and pain.   ACTIVITY LIMITATIONS: sitting, standing, squatting, stairs, transfers, and locomotion level  PARTICIPATION LIMITATIONS: driving, shopping, community activity, occupation, and yard work  PERSONAL FACTORS: Age,  Education, Fitness, Past/current experiences, Social background, and Time since onset of injury/illness/exacerbation are also affecting patient's functional outcome.   REHAB POTENTIAL: Good  CLINICAL DECISION MAKING: Stable/uncomplicated  EVALUATION COMPLEXITY: Low   GOALS: Goals reviewed with patient? No  SHORT TERM GOALS: Target date: 06/15/2023   Will be compliant with appropriate progressive HEP Goal status: 05/27/23 MET   2. L knee AROM flexion to be at least 100* and extension AROM to be no more than 5*  Goal status: 05/27/23 progressing   05/30/23 MET   3. Will be independent with edema management strategies  Goal status: 05/27/23 MET   4. Gait pattern to have normalized with LRAD Goal status:progressing 05/27/23   LONG TERM GOALS: Target date: 07/13/2023    MMT to have improved by one grade all weak groups Goal status: initial   2. Knee AROM to be WNL and pain free Goal status: initial    3. Will be able to ascend and descend stairs reciprocally with no increase in pain Goal status: initial   4. Will be able to ambulate community distances and perform all household tasks with no increase in pain  Goal status: initial   5. PSFS  to have improved by at least 3 points to show improved QOL and subjective improvement  Goal status: initial   6. Will be able to get down to the floor to play with and take care of grand-kids Goal status: initial     PLAN:  PT FREQUENCY: 2x/week  PT DURATION: 8 weeks  PLANNED INTERVENTIONS: 97164- PT Re-evaluation, 97110-Therapeutic exercises, 97530- Therapeutic activity, [id number]- Neuromuscular re-education, 97535-  Self Care, 16109- Manual therapy, [phone]- Gait training, [phone]- Orthotic Fit/training, [phone]- Vasopneumatic device, Patient/Family education, Balance training, Stair training, Taping, Dry Needling, Cryotherapy, and Moist heat  PLAN FOR NEXT SESSION: SLS, step ups/downs,balance, dynamic surfaces. 3 more weeks of PT and D/C .  She is doing very well just really maximizing func over next 3 weeks as she is young and very active. QUAD STRENGTH. Sees MD May 1st, might be ready for DC at that point. Try elliptical next visit per her request      Patient Details  Name: Abigail Wright MRN: [id number] Date of Birth: [birth year] Referring Provider:  Arnie Lao*  Encounter Date: 06/14/2023   Aquilla Bayley, PTA 06/14/2023, 11:38 AM  [facility] [address]. [place], [place], [address] Phone: [phone]   Fax:  [phone]

## 2023-06-16 ENCOUNTER — Encounter: Payer: Self-pay | Admitting: Physical Therapy

## 2023-06-16 ENCOUNTER — Ambulatory Visit: Payer: PRIVATE HEALTH INSURANCE | Admitting: Physical Therapy

## 2023-06-16 DIAGNOSIS — M25562 Pain in left knee: Secondary | ICD-10-CM | POA: Diagnosis not present

## 2023-06-16 DIAGNOSIS — M25662 Stiffness of left knee, not elsewhere classified: Secondary | ICD-10-CM

## 2023-06-16 DIAGNOSIS — R262 Difficulty in walking, not elsewhere classified: Secondary | ICD-10-CM

## 2023-06-16 DIAGNOSIS — M6281 Muscle weakness (generalized): Secondary | ICD-10-CM

## 2023-06-16 DIAGNOSIS — R600 Localized edema: Secondary | ICD-10-CM

## 2023-06-16 NOTE — Therapy (Signed)
 OUTPATIENT PHYSICAL THERAPY LOWER EXTREMITY    Patient Name: Abigail Wright MRN: [id number] DOB:12/22/1965, 58 y.o., female Today's Date: 06/16/2023  END OF SESSION:  PT End of Session - 06/16/23 1155     Visit Number 9    Number of Visits 17    Date for PT Re-Evaluation 07/13/23    Authorization Type Generic Commerical    Authorization Time Period 05/18/23 to 07/13/23    PT Start Time 1147    PT Stop Time 1225    PT Time Calculation (min) 38 min    Activity Tolerance Patient tolerated treatment well    Behavior During Therapy WFL for tasks assessed/performed                Past Medical History:  Diagnosis Date   ADD (attention deficit disorder)    Anxiety    Arthritis    Blood transfusion without reported diagnosis    1993   Depression    GERD (gastroesophageal reflux disease)    in past but recently controlled with diet and exercise.   Hyperlipidemia    Hypertension    Pneumonia    Past Surgical History:  Procedure Laterality Date   ABDOMINAL HYSTERECTOMY  1993   BUNIONECTOMY Left    CARPAL TUNNEL RELEASE Bilateral    cyst removal from left hand Bilateral    FOOT SURGERY Left    plantar fascicitis surgery   KNEE CARTILAGE SURGERY Left    SHOULDER ARTHROSCOPY WITH SUBACROMIAL DECOMPRESSION AND OPEN ROTATOR C Right 07/07/2021   Procedure: RIGHT SHOULDER ARTHROSCOPY WITH SUBACROMIAL DECOMPRESSION / ROTATOR CUFF REPAIR/ DISTAL CLAVICLE RESECTION /BICEPS TENODESIS;  Surgeon: Shirlee Dotter, MD;  Location: [facility];  Service: Orthopedics;  Laterality: Right;   TENDON REPAIR Left 03/21/2017   Left hand    TONSILLECTOMY     TOTAL KNEE ARTHROPLASTY Left 05/13/2023   Procedure: LEFT TOTAL KNEE ARTHROPLASTY;  Surgeon: Arnie Lao, MD;  Location: [facility];  Service: Orthopedics;  Laterality: Left;   Patient Active Problem List   Diagnosis Date Noted   Status post total left knee replacement 05/13/2023   Chronic heel pain, right 01/27/2023   Pain  in right knee 08/04/2021   Olecranon bursitis, right elbow 08/04/2021   Complete tear of right rotator cuff 07/14/2021   Impingement syndrome of right shoulder 06/17/2021   Major depressive disorder, recurrent episode, moderate (HCC) 03/06/2021   Pain in left knee 11/04/2017   Benign paroxysmal positional vertigo of left ear 05/15/2015   Family history of breast cancer in mother 08/02/2011    PCP: Jobe Mulder DO   REFERRING PROVIDER: Arnie Lao, MD  REFERRING DIAG: [phone] (ICD-10-CM) - Status post total left knee replacement M17.12 (ICD-10-CM) - Unilateral primary osteoarthritis, left knee  THERAPY DIAG:  Acute pain of left knee  Muscle weakness (generalized)  Difficulty in walking, not elsewhere classified  Stiffness of left knee, not elsewhere classified  Localized edema  Rationale for Evaluation and Treatment: Rehabilitation  ONSET DATE:  surgery 05/13/23  SUBJECTIVE:   SUBJECTIVE STATEMENT:    Not much new, we were talking about maybe cutting loose soon, only thing I have at home equipment wise is an elliptical    PERTINENT HISTORY: See above  PAIN:  Are you having pain? 0/10 j"no pain just tight"   PRECAUTIONS: Fall  RED FLAGS: None   WEIGHT BEARING RESTRICTIONS: No  FALLS:  Has patient fallen in last 6 months? No  LIVING ENVIRONMENT: Lives with: lives with  their spouse Lives in: House/apartment Stairs: 2 story home, has ramp in the back of the house + 1-2 steps with rail to enter home. 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    PATIENT SURVEYS:    Patient-Specific Activity Scoring Scheme  "0" represents "unable to perform." "10" represents "able to perform at prior level. 0 1 2 3 4 5 6 7 8 9  10 (Date and Score)   Activity Eval     1. Getting onto the floor to play with grandkids  0     2. Stairs  5     3. Standing for a long period of time  5   4.    5.    Score 3.3    Total score = sum of the activity scores/number of activities Minimum detectable change (90%CI) for average score = 2 points Minimum detectable change (90%CI) for single activity score = 3 points     COGNITION: Overall cognitive status: Within functional limits for tasks assessed       EDEMA:   As appropriate given surgery 5 days ago     LOWER EXTREMITY ROM:  Active ROM Left eval Left 06/07/23  Hip flexion    Hip extension    Hip abduction    Hip adduction    Hip internal rotation    Hip external rotation    Knee flexion 70* supine heel slide  105* seated flexion AROM   Knee extension 15* supine with heel prop 6* LAQ, mm tremulous and weak   Ankle dorsiflexion    Ankle plantarflexion    Ankle inversion    Ankle eversion     (Blank rows = not tested)  LOWER EXTREMITY MMT:  MMT Right eval Left eval  Hip flexion 5 3-  Hip extension    Hip abduction    Hip adduction    Hip internal rotation    Hip external rotation    Knee flexion 5 Deferred, knee pain    Knee extension 5 3 in available ROM, did not provide manual resistance due to pain levels   Ankle dorsiflexion    Ankle plantarflexion    Ankle inversion    Ankle eversion     (Blank rows = not tested)    FUNCTIONAL TESTS:  5 times sit to stand: 13 seconds no UEs high mat table  Timed up and go (TUG): 28 seconds RW  3 minute walk test: 355ft RW   GAIT: Distance walked: 344ft  Assistive device utilized: Environmental consultant - 2 wheeled Level of assistance: Modified  independence Comments: limited L knee ROM with gait cycle, heavy reliance on RW with UEs, mildly antalgic  TREATMENT DATE:   06/16/23  Elliptical L1 x8 minutes- 6 min forward, 2 backward 3 way taps off blue foam pad x10 B avoiding knee hyperextension  TKEs black TB x15  Standing HS stretch 2x30 seconds B TM belt pushes 2x5 B Stool pushes forward and backwards x3 rounds Knee flexion stretch on steps 15x3 seconds  Education and encouragement about her progress with PT following TKR surgery, POC to likely DC next week with advanced HEP, general guidelines on progressing herself on the elliptical at home and signs of over doing          06/14/23 Nustep L 6 Manual resisted TKE AROM seated 0-110 Blue tband HS curl LLE 10 x 5# hip flex-knee ext and lower 10 x LLE 5# standing HS curl 15 x 5# hip 3 way 10 x 5# stepping over 8 in fwd and SW 10 x each BOSU step fwd and laterally 10x SL vector taps Sit fit squats 15 x Leg press DL and SL  . Calf raises 50# 2 sets 15 KNee ext SL 10 # 10 x HS 20# 10x SL  06/08/23  Scifit bike seat 12--> 10 for 10 minutes for w/u and ROM Wall squats x10 self selected depth cues for midline/equal WB Forward step ups 6 inch step x15 B HS stretch 3x30 L seconds on steps Knee flexion stretch on 12 inch step x5 second holds   Double step tap on blue foam pad x10 B Cross midline taps to targets from blue foam x10 B multi level   Retrograde massage for edema control Roller to L quads (percussion gun not available)      PATIENT EDUCATION:  Education details: see above  Person educated: Patient and Spouse Education method: Explanation, Demonstration, and Handouts Education comprehension: verbalized understanding, returned demonstration, and needs further education  HOME EXERCISE PROGRAM: Hospital HEP    ASSESSMENT:  CLINICAL IMPRESSION:   Pt arrives today really doing well, just needs to focus on strength.3 more visits - will focus on strength and est HEP. Introduced elliptical today, she did well and was safe getting on and off so encouraged this at home with gradual progressions as to not overdo. Kept working on advanced strength and balance with likely plan to DC next week.   OBJECTIVE IMPAIRMENTS: Abnormal gait, decreased activity tolerance, decreased balance, decreased knowledge of use of DME, decreased mobility, difficulty walking, decreased ROM, decreased strength, increased edema, increased fascial restrictions, impaired flexibility, obesity, and pain.   ACTIVITY LIMITATIONS: sitting, standing, squatting, stairs, transfers, and locomotion level  PARTICIPATION LIMITATIONS: driving, shopping, community activity, occupation, and yard work  PERSONAL FACTORS: Age, Education, Fitness, Past/current experiences, Social background, and Time since onset of injury/illness/exacerbation are also affecting patient's functional outcome.   REHAB POTENTIAL: Good  CLINICAL DECISION MAKING: Stable/uncomplicated  EVALUATION COMPLEXITY: Low   GOALS: Goals reviewed with patient? No  SHORT TERM GOALS: Target date: 06/15/2023   Will be compliant with appropriate progressive HEP Goal status: 05/27/23 MET   2. L knee AROM flexion to be at least 100* and extension AROM to be no more than 5*  Goal status: 05/27/23 progressing   05/30/23 MET   3. Will be independent with edema management strategies  Goal status: 05/27/23 MET   4. Gait pattern to have normalized with LRAD Goal status:progressing 05/27/23   LONG TERM GOALS: Target date: 07/13/2023    MMT to have improved by one grade all weak groups Goal status: initial   2. Knee AROM to be  WNL and pain free Goal status: initial    3. Will be able to ascend and descend stairs reciprocally with no increase in pain Goal status: initial   4.  Will be able to ambulate community distances and perform all household tasks with no increase in pain  Goal status: initial   5. PSFS  to have improved by at least 3 points to show improved QOL and subjective improvement  Goal status: initial   6. Will be able to get down to the floor to play with and take care of grand-kids Goal status: initial     PLAN:  PT FREQUENCY: 2x/week  PT DURATION: 8 weeks  PLANNED INTERVENTIONS: 97164- PT Re-evaluation, 97110-Therapeutic exercises, 97530- Therapeutic activity, 97112- Neuromuscular re-education, 97535- Self Care, 13086- Manual therapy, [id number]- Gait training, [phone]- Orthotic Fit/training, [phone]- Vasopneumatic device, Patient/Family education, Balance training, Stair training, Taping, Dry Needling, Cryotherapy, and Moist heat  PLAN FOR NEXT SESSION: SLS, step ups/downs,balance, dynamic surfaces. 3 more weeks of PT and D/C . She is doing very well just really maximizing func over next 3 weeks as she is young and very active. QUAD STRENGTH. Sees MD May 1st, likely  ready for DC at that point.      Terrel Ferries, PT, DPT 06/16/23 12:26 PM   [facility] [address] [facility]. [place], [place], [address] Phone: [phone]   Fax:  [phone]

## 2023-06-21 ENCOUNTER — Encounter: Payer: Self-pay | Admitting: Physical Therapy

## 2023-06-21 ENCOUNTER — Encounter: Payer: Self-pay | Admitting: Adult Health

## 2023-06-21 ENCOUNTER — Ambulatory Visit: Payer: PRIVATE HEALTH INSURANCE | Admitting: Physical Therapy

## 2023-06-21 ENCOUNTER — Telehealth (INDEPENDENT_AMBULATORY_CARE_PROVIDER_SITE_OTHER): Payer: Self-pay | Admitting: Adult Health

## 2023-06-21 DIAGNOSIS — F411 Generalized anxiety disorder: Secondary | ICD-10-CM

## 2023-06-21 DIAGNOSIS — M25562 Pain in left knee: Secondary | ICD-10-CM

## 2023-06-21 DIAGNOSIS — M25511 Pain in right shoulder: Secondary | ICD-10-CM

## 2023-06-21 DIAGNOSIS — F331 Major depressive disorder, recurrent, moderate: Secondary | ICD-10-CM

## 2023-06-21 DIAGNOSIS — F909 Attention-deficit hyperactivity disorder, unspecified type: Secondary | ICD-10-CM | POA: Diagnosis not present

## 2023-06-21 DIAGNOSIS — R262 Difficulty in walking, not elsewhere classified: Secondary | ICD-10-CM

## 2023-06-21 DIAGNOSIS — M25662 Stiffness of left knee, not elsewhere classified: Secondary | ICD-10-CM

## 2023-06-21 DIAGNOSIS — M6281 Muscle weakness (generalized): Secondary | ICD-10-CM

## 2023-06-21 MED ORDER — AMPHETAMINE-DEXTROAMPHETAMINE 30 MG PO TABS: 30.0000 mg | ORAL_TABLET | Freq: Two times a day (BID) | ORAL | 0 refills | Status: DC

## 2023-06-21 MED ORDER — AMPHETAMINE-DEXTROAMPHETAMINE 30 MG PO TABS
30.0000 mg | ORAL_TABLET | Freq: Two times a day (BID) | ORAL | 0 refills | Status: DC
Start: 2023-07-19 — End: 2023-09-20

## 2023-06-21 MED ORDER — CLONAZEPAM 0.5 MG PO TABS: 0.5000 mg | ORAL_TABLET | Freq: Every day | ORAL | 2 refills | Status: DC

## 2023-06-21 NOTE — Progress Notes (Signed)
 Abigail Wright [id number] [birth year] 58 y.o.  Virtual Visit via Video Note  I connected with pt @ on 06/21/23 at 12:00 PM EDT by a video enabled telemedicine application and verified that I am speaking with the correct person using two identifiers.   I discussed the limitations of evaluation and management by telemedicine and the availability of in person appointments. The patient expressed understanding and agreed to proceed.  I discussed the assessment and treatment plan with the patient. The patient was provided an opportunity to ask questions and all were answered. The patient agreed with the plan and demonstrated an understanding of the instructions.   The patient was advised to call back or seek an in-person evaluation if the symptoms worsen or if the condition fails to improve as anticipated.  I provided 15 minutes of non-face-to-face time during this encounter.  The patient was located at home.  The provider was located at [facility].   Reagan Camera, NP   Subjective:   Patient ID:  Abigail Wright is a 58 y.o. (DOB [birth year]) female.  Chief Complaint: No chief complaint on file.   HPI Abigail Wright presents for follow-up of anxiety, ADHD and depression.  Describes mood today as "ok". Pleasant. Denies tearfulness. Mood symptoms - denies depression, anxiety and irritability. Reports stable interest and motivation. Denies panic attacks. Denies worry, rumination and over thinking. Mood is consistent. Stating "I feel like I'm doing ok". Feels like medications work well. Taking medications as prescribed.  Energy levels stable. Active, does not have a regular exercise routine.  Enjoys some usual interests and activities. Married. Lives with husband. Spending time with family - 2 daughters - 2 grand daughters. Appetite adequate. Weight stable.  Sleeps well most nights. Averages 7 to 8  hours.  Focus and concentration stable. Completing tasks.  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   Denies SI or HI.  Denies AH or VH. Denies self harm.  Denies substance use.   Review of Systems:  Review of Systems  Musculoskeletal:  Negative for gait problem.  Neurological:  Negative for tremors.  Psychiatric/Behavioral:         Please refer to HPI    Medications: I have reviewed the patient's current medications.  Current Outpatient Medications  Medication Sig Dispense Refill   [START ON 07/19/2023] amphetamine -dextroamphetamine  (ADDERALL) 30 MG tablet Take 1 tablet by mouth 2 (two) times daily. 60 tablet 0   [START ON 08/16/2023] amphetamine -dextroamphetamine  (ADDERALL) 30 MG tablet Take 1 tablet by mouth 2 (two) times daily. 60 tablet 0   amphetamine -dextroamphetamine  (ADDERALL) 30 MG tablet Take 1 tablet by mouth 2 (two) times daily. 60 tablet 0   aspirin  81 MG chewable tablet Chew 1 tablet (81 mg total) by mouth 2 (two) times daily. 30 tablet 0   atorvastatin  (LIPITOR) 40 MG tablet Take 1 tablet (40 mg total) by mouth daily. 90 tablet 3   Biotin-Vitamin C (HAIR SKIN NAILS GUMMIES PO) Take 2 tablets by mouth in the morning.     buPROPion  (WELLBUTRIN  XL) 300 MG 24 hr tablet Take 1 tablet (300 mg total) by mouth daily. 90 tablet 3   carboxymethylcellulose (REFRESH TEARS) 0.5 % SOLN Place 1 drop into both eyes in the morning.     citalopram  (CELEXA ) 20 MG tablet TAKE 1 TABLET BY MOUTH EACH DAY (Patient taking differently: Take 20 mg by mouth at bedtime.) 90 tablet 3   clonazePAM  (KLONOPIN ) 0.5 MG tablet Take 1 tablet (0.5  mg total) by mouth at bedtime. 90 tablet 2   diclofenac  (VOLTAREN ) 75 MG EC tablet TAKE 1 TABLET BY MOUTH 2 TIMES A DAY WITH MEALS AS NEEDED FOR MILD (1-3) OR MODERATE (4-6) PAIN 60 tablet 0   fluticasone (FLONASE) 50 MCG/ACT nasal spray Place 1 spray into both nostrils in the morning.     ketorolac  (TORADOL ) 10 MG tablet TAKE 1 TABLET BY MOUTH EVERY 6 HOURS WITH FOOD AS NEEDED FOR PAIN &  INFLAMMATION 20 tablet 0   Loratadine  10 MG CAPS Take 10 mg by mouth daily.     Multiple Vitamin (MULTIVITAMIN WITH MINERALS) TABS tablet Take 1 tablet by mouth in the morning. One A Day for Women 50+     omeprazole (PRILOSEC OTC) 20 MG tablet Take 20 mg by mouth daily before breakfast.     oxyCODONE  (OXY IR/ROXICODONE ) 5 MG immediate release tablet TAKE 1 TO 2 TABLETS BY MOUTH EVERY 6 HOURS AS NEEDED FOR PAIN 30 tablet 0   Psyllium (METAMUCIL PO) Take 3 tablets by mouth in the morning. Metamucil Fiber Gummies Plus Probiotics for Digestive Health     tiZANidine  (ZANAFLEX ) 4 MG tablet Take 1 tablet (4 mg total) by mouth every 6 (six) hours as needed for muscle spasms. 30 tablet 0   No current facility-administered medications for this visit.    Medication Side Effects: None  Allergies: No Known Allergies  Past Medical History:  Diagnosis Date   ADD (attention deficit disorder)    Anxiety    Arthritis    Blood transfusion without reported diagnosis    1993   Depression    GERD (gastroesophageal reflux disease)    in past but recently controlled with diet and exercise.   Hyperlipidemia    Hypertension    Pneumonia     Family History  Problem Relation Age of Onset   Cancer Mother    Diabetes Father    Heart disease Paternal Uncle    Hypertension Paternal Uncle    Colon cancer Neg Hx    Esophageal cancer Neg Hx    Rectal cancer Neg Hx    Stomach cancer Neg Hx    Stroke Neg Hx     Social History   Socioeconomic History   Marital status: Married    Spouse name: Not on file   Number of children: Not on file   Years of education: Not on file   Highest education level: Associate degree: occupational, Scientist, product/process development, or vocational program  Occupational History   Not on file  Tobacco Use   Smoking status: Former   Smokeless tobacco: Never  Vaping Use   Vaping status: Every Day   Substances: Nicotine, Flavoring  Substance and Sexual Activity   Alcohol  use: Yes    Comment:  occasionally   Drug use: No   Sexual activity: Yes    Partners: Male    Birth control/protection: None, Post-menopausal  Other Topics Concern   Not on file  Social History Narrative   Pt lives husband    Pt works    Social Drivers of Corporate investment banker Strain: Low Risk  (04/27/2023)   Overall Financial Resource Strain (CARDIA)    Difficulty of Paying Living Expenses: Not very hard  Food Insecurity: No Food Insecurity (05/13/2023)   Hunger Vital Sign    Worried About Running Out of Food in the Last Year: Never true    Ran Out of Food in the Last Year: Never true  Transportation Needs: No  Transportation Needs (05/13/2023)   PRAPARE - Administrator, Civil Service (Medical): No    Lack of Transportation (Non-Medical): No  Physical Activity: Insufficiently Active (04/27/2023)   Exercise Vital Sign    Days of Exercise per Week: 2 days    Minutes of Exercise per Session: 20 min  Stress: No Stress Concern Present (04/27/2023)   [employer] of Occupational Health - Occupational Stress Questionnaire    Feeling of Stress : Only a little  Social Connections: Moderately Integrated (04/27/2023)   Social Connection and Isolation Panel [NHANES]    Frequency of Communication with Friends and Family: More than three times a week    Frequency of Social Gatherings with Friends and Family: Once a week    Attends Religious Services: 1 to 4 times per year    Active Member of [employer] or Organizations: No    Attends Banker Meetings: Not on file    Marital Status: Married  Catering manager Violence: Not At Risk (05/13/2023)   Humiliation, Afraid, Rape, and Kick questionnaire    Fear of Current or Ex-Partner: No    Emotionally Abused: No    Physically Abused: No    Sexually Abused: No    Past Medical History, Surgical history, Social history, and Family history were reviewed and updated as appropriate.   Please see review of systems for further details on the  patient's review from today.   Objective:   Physical Exam:  There were no vitals taken for this visit.  Physical Exam Constitutional:      General: She is not in acute distress. Musculoskeletal:        General: No deformity.  Neurological:     Mental Status: She is alert and oriented to person, place, and time.     Coordination: Coordination normal.  Psychiatric:        Attention and Perception: Attention and perception normal. She does not perceive auditory or visual hallucinations.        Mood and Affect: Mood normal. Mood is not anxious or depressed. Affect is not labile, blunt, angry or inappropriate.        Speech: Speech normal.        Behavior: Behavior normal.        Thought Content: Thought content normal. Thought content is not paranoid or delusional. Thought content does not include homicidal or suicidal ideation. Thought content does not include homicidal or suicidal plan.        Cognition and Memory: Cognition and memory normal.        Judgment: Judgment normal.     Comments: Insight intact     Lab Review:     Component Value Date/Time   NA 139 05/14/2023 0339   K 4.1 05/14/2023 0339   CL 105 05/14/2023 0339   CO2 28 05/14/2023 0339   GLUCOSE 137 (H) 05/14/2023 0339   BUN 17 05/14/2023 0339   CREATININE 0.77 05/14/2023 0339   CALCIUM  8.9 05/14/2023 0339   PROT 7.1 05/04/2023 1033   ALBUMIN 4.7 05/04/2023 1033   AST 21 05/04/2023 1033   ALT 35 05/04/2023 1033   ALKPHOS 101 05/04/2023 1033   BILITOT 0.5 05/04/2023 1033   GFRNONAA >60 05/14/2023 0339       Component Value Date/Time   WBC 8.8 05/14/2023 0339   RBC 4.48 05/14/2023 0339   HGB 12.7 05/14/2023 0339   HCT 41.0 05/14/2023 0339   PLT 218 05/14/2023 0339   MCV 91.5 05/14/2023 0339  MCH 28.3 05/14/2023 0339   MCHC 31.0 05/14/2023 0339   RDW 14.1 05/14/2023 0339   LYMPHSABS 2.7 05/04/2023 1033   MONOABS 0.7 05/04/2023 1033   EOSABS 0.1 05/04/2023 1033   BASOSABS 0.0 05/04/2023 1033     No results found for: "POCLITH", "LITHIUM"   No results found for: "PHENYTOIN", "PHENOBARB", "VALPROATE", "CBMZ"   .res Assessment: Plan:    Plan:  1. Adderall 30mg  BID 2. Celexa  20 mg daily 3. Wellbutrin  XL 300mg  4. Clonazepam  0.5mg  daily   Continue to monitor BP - WNL  RTC 3 months  15 minutes spent dedicated to the care of this patient on the date of this encounter to include pre-visit review of records, ordering of medication, post visit documentation, and face-to-face time with the patient discussing depression, anxiety, and insomnia. Discussed continuing current medication regimen.  Patient advised to contact office with any questions, adverse effects, or acute worsening in signs and symptoms.  Discussed potential benefits, risk, and side effects of benzodiazepines to include potential risk of tolerance and dependence, as well as possible drowsiness. Advised patient not to drive if experiencing drowsiness and to take lowest possible effective dose to minimize risk of dependence and tolerance.  Discussed potential benefits, risks, and side effects of stimulants with patient to include increased heart rate, palpitations, insomnia, increased anxiety, increased irritability, or decreased appetite. Instructed patient to contact office if experiencing any significant tolerability issues.  Diagnoses and all orders for this visit:  Major depressive disorder, recurrent episode, moderate (HCC)  Generalized anxiety disorder -     clonazePAM  (KLONOPIN ) 0.5 MG tablet; Take 1 tablet (0.5 mg total) by mouth at bedtime.  Attention deficit hyperactivity disorder (ADHD), unspecified ADHD type -     amphetamine -dextroamphetamine  (ADDERALL) 30 MG tablet; Take 1 tablet by mouth 2 (two) times daily. -     amphetamine -dextroamphetamine  (ADDERALL) 30 MG tablet; Take 1 tablet by mouth 2 (two) times daily. -     amphetamine -dextroamphetamine  (ADDERALL) 30 MG tablet; Take 1 tablet by mouth 2  (two) times daily.     Please see After Visit Summary for patient specific instructions.  Future Appointments  Date Time Provider Department Center  06/23/2023 11:45 AM Yates Helm, PT [facility]  06/23/2023  2:15 PM Arnie Lao, MD [facility] None    No orders of the defined types were placed in this encounter.     -------------------------------

## 2023-06-21 NOTE — Therapy (Signed)
 OUTPATIENT PHYSICAL THERAPY LOWER EXTREMITY    Patient Name: Abigail Wright MRN: [id number] DOB:1965/10/13, 58 y.o., female Today's Date: 06/21/2023  END OF SESSION:  PT End of Session - 06/21/23 1020     Visit Number 10    Number of Visits 17    Date for PT Re-Evaluation 07/13/23    Authorization Type Generic Commerical    Authorization Time Period 05/18/23 to 07/13/23    PT Start Time 1020    PT Stop Time 1100    PT Time Calculation (min) 40 min                Past Medical History:  Diagnosis Date   ADD (attention deficit disorder)    Anxiety    Arthritis    Blood transfusion without reported diagnosis    1993   Depression    GERD (gastroesophageal reflux disease)    in past but recently controlled with diet and exercise.   Hyperlipidemia    Hypertension    Pneumonia    Past Surgical History:  Procedure Laterality Date   ABDOMINAL HYSTERECTOMY  1993   BUNIONECTOMY Left    CARPAL TUNNEL RELEASE Bilateral    cyst removal from left hand Bilateral    FOOT SURGERY Left    plantar fascicitis surgery   KNEE CARTILAGE SURGERY Left    SHOULDER ARTHROSCOPY WITH SUBACROMIAL DECOMPRESSION AND OPEN ROTATOR C Right 07/07/2021   Procedure: RIGHT SHOULDER ARTHROSCOPY WITH SUBACROMIAL DECOMPRESSION / ROTATOR CUFF REPAIR/ DISTAL CLAVICLE RESECTION /BICEPS TENODESIS;  Surgeon: Shirlee Dotter, MD;  Location: [facility];  Service: Orthopedics;  Laterality: Right;   TENDON REPAIR Left 03/21/2017   Left hand    TONSILLECTOMY     TOTAL KNEE ARTHROPLASTY Left 05/13/2023   Procedure: LEFT TOTAL KNEE ARTHROPLASTY;  Surgeon: Arnie Lao, MD;  Location: [facility];  Service: Orthopedics;  Laterality: Left;   Patient Active Problem List   Diagnosis Date Noted   Status post total left knee replacement 05/13/2023   Chronic heel pain, right 01/27/2023   Pain in right knee 08/04/2021   Olecranon bursitis, right elbow 08/04/2021   Complete tear of right rotator cuff  07/14/2021   Impingement syndrome of right shoulder 06/17/2021   Major depressive disorder, recurrent episode, moderate (HCC) 03/06/2021   Pain in left knee 11/04/2017   Benign paroxysmal positional vertigo of left ear 05/15/2015   Family history of breast cancer in mother 08/02/2011    PCP: Jobe Mulder DO   REFERRING PROVIDER: Arnie Lao, MD  REFERRING DIAG: [id number] (ICD-10-CM) - Status post total left knee replacement M17.12 (ICD-10-CM) - Unilateral primary osteoarthritis, left knee  THERAPY DIAG:  Acute pain of left knee  Muscle weakness (generalized)  Difficulty in walking, not elsewhere classified  Stiffness of left knee, not elsewhere classified  Acute pain of right shoulder  Rationale for Evaluation and Treatment: Rehabilitation  ONSET DATE:  surgery 05/13/23  SUBJECTIVE:   SUBJECTIVE STATEMENT:    Knee is feeling sore and feels like it is going to buckle on me. She did a lot of activity today without proper rest.  PERTINENT HISTORY: See above  PAIN:  Are you having pain? 4/10 soreness  Aching pain with WB  PRECAUTIONS: Fall  RED FLAGS: None   WEIGHT BEARING RESTRICTIONS: No  FALLS:  Has patient fallen in last 6 months? No  LIVING ENVIRONMENT: Lives with: lives with their spouse Lives in: House/apartment Stairs: 2 story home, has ramp in the back of  the house + 1-2 steps with rail to enter home. 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    PATIENT  SURVEYS:    Patient-Specific Activity Scoring Scheme  "0" represents "unable to perform." "10" represents "able to perform at prior level. 0 1 2 3 4 5 6 7 8 9  10 (Date and Score)   Activity Eval     1. Getting onto the floor to play with grandkids  0     2. Stairs  5     3. Standing for a long period of time  5   4.    5.    Score 3.3    Total score = sum of the activity scores/number of activities Minimum detectable change (90%CI) for average score = 2 points Minimum detectable change (90%CI) for single activity score = 3 points     COGNITION: Overall cognitive status: Within functional limits for tasks assessed       EDEMA:   As appropriate given surgery 5 days ago     LOWER EXTREMITY ROM:  Active ROM Left eval Left 06/07/23  Hip flexion    Hip extension    Hip abduction    Hip adduction    Hip internal rotation    Hip external rotation    Knee flexion 70* supine heel slide  105* seated flexion AROM   Knee extension 15* supine with heel prop 6* LAQ, mm tremulous and weak   Ankle dorsiflexion    Ankle plantarflexion    Ankle inversion    Ankle eversion     (Blank rows = not tested)  LOWER EXTREMITY MMT:  MMT Right eval Left eval  Hip flexion 5 3-  Hip extension    Hip abduction    Hip adduction    Hip internal rotation    Hip external rotation    Knee flexion 5 Deferred, knee pain    Knee extension 5 3 in available ROM, did not provide manual resistance due to pain levels   Ankle dorsiflexion    Ankle plantarflexion    Ankle inversion    Ankle eversion     (Blank rows = not tested)    FUNCTIONAL TESTS:  5 times sit to stand: 13 seconds no UEs high mat table  Timed up and go (TUG): 28 seconds RW  3 minute walk test: 381ft RW   GAIT: Distance walked: 318ft  Assistive device utilized: Environmental consultant - 2 wheeled Level of assistance: Modified independence Comments: limited L knee ROM with gait cycle, heavy reliance on RW with UEs, mildly antalgic  TREATMENT DATE:  06/21/23 Elliptical L2  each way PROM R knee Knee ext BLE 20# x10, LLE 2x10 10# HS curl 25# BLE x10, LLE 2x10 20# 6in 3pt step down 30# resisted side step squats x8 each way LLE on Dynadisc  step over  Tap targets Squats on dynadisc x10 Tibiofibular Jt mobs HS, piriformis stretch    06/16/23  Elliptical L1 x8 minutes- 6 min forward, 2 backward 3 way taps off blue foam pad x10 B avoiding knee hyperextension  TKEs black TB x15  Standing HS stretch 2x30 seconds B TM belt pushes 2x5 B Stool pushes forward and backwards x3 rounds Knee flexion stretch on steps 15x3 seconds  Education and encouragement about her progress with PT following TKR surgery, POC to likely DC next week with advanced HEP, general guidelines on progressing herself on the elliptical at home and signs of over doing          06/14/23 Nustep L 6 Manual resisted TKE AROM seated 0-110 Blue tband HS curl LLE 10 x 5# hip flex-knee ext and lower 10 x LLE 5# standing HS curl 15 x 5# hip 3 way 10 x 5# stepping over 8 in fwd and SW 10 x each BOSU step fwd and laterally 10x SL vector taps Sit fit squats 15 x Leg press DL and SL  . Calf raises 50# 2 sets 15 KNee ext SL 10 # 10 x HS 20# 10x SL  06/08/23  Scifit bike seat 12--> 10 for 10 minutes for w/u and ROM Wall squats x10 self selected depth cues for midline/equal WB Forward step ups 6 inch step x15 B HS stretch 3x30 L seconds on steps Knee flexion stretch on 12 inch step x5 second holds   Double step tap on blue foam pad x10 B Cross midline taps to targets from blue foam x10 B multi level   Retrograde massage for edema control Roller to L quads (percussion gun not available)      PATIENT EDUCATION:  Education details: see above  Person educated: Patient and Spouse Education method:  Explanation, Demonstration, and Handouts Education comprehension: verbalized understanding, returned demonstration, and needs further education  HOME EXERCISE PROGRAM: Hospital HEP   ASSESSMENT:  CLINICAL IMPRESSION:   She arrived walking with a slight limp. She overworked herself this weekend so her tolerance for exercises was lower than usual. She fatigued quickly and needed rest after each exercise. She had some hamstring and glute tightness with 3 way step downs, so she needed to stretch during her rest periods. I reminded her to plan frequent rest periods when has busy days.  OBJECTIVE IMPAIRMENTS: Abnormal gait, decreased activity tolerance, decreased balance, decreased knowledge of use of DME, decreased mobility, difficulty walking, decreased ROM, decreased strength, increased edema, increased fascial restrictions, impaired flexibility, obesity, and pain.   ACTIVITY LIMITATIONS: sitting, standing, squatting, stairs, transfers, and locomotion level  PARTICIPATION LIMITATIONS: driving, shopping, community activity, occupation, and yard work  PERSONAL FACTORS: Age, Education, Fitness, Past/current experiences, Social background, and Time since onset of injury/illness/exacerbation are also affecting patient's functional outcome.   REHAB POTENTIAL: Good  CLINICAL DECISION MAKING: Stable/uncomplicated  EVALUATION COMPLEXITY: Low   GOALS: Goals reviewed with patient? No  SHORT TERM GOALS: Target date: 06/15/2023   Will be compliant with appropriate progressive HEP Goal status: 05/27/23 MET   2. L knee AROM flexion to be at least 100* and extension AROM to be no more than 5*  Goal status: 05/27/23 progressing   05/30/23  MET   3. Will be independent with edema management strategies  Goal status: 05/27/23 MET   4. Gait pattern to have normalized with LRAD Goal status:progressing 05/27/23   LONG TERM GOALS: Target date: 07/13/2023    MMT to have improved by one grade all weak  groups Goal status: initial   2. Knee AROM to be WNL and pain free Goal status: initial    3. Will be able to ascend and descend stairs reciprocally with no increase in pain Goal status: initial   4. Will be able to ambulate community distances and perform all household tasks with no increase in pain  Goal status: initial   5. PSFS  to have improved by at least 3 points to show improved QOL and subjective improvement  Goal status: initial   6. Will be able to get down to the floor to play with and take care of grand-kids Goal status: initial     PLAN:  PT FREQUENCY: 2x/week  PT DURATION: 8 weeks  PLANNED INTERVENTIONS: [id number]- PT Re-evaluation, 97110-Therapeutic exercises, 97530- Therapeutic activity, 97112- Neuromuscular re-education, 97535- Self Care, 96045- Manual therapy, [id number]- Gait training, [phone]- Orthotic Fit/training, [phone]- Vasopneumatic device, Patient/Family education, Balance training, Stair training, Taping, Dry Needling, Cryotherapy, and Moist heat  PLAN FOR NEXT SESSION: SLS, step ups/downs,balance, dynamic surfaces. 3 more weeks of PT and D/C . She is doing very well just really maximizing func over next 3 weeks as she is young and very active. QUAD STRENGTH. Sees MD May 1st, likely  ready for DC at that point.      Laurelyn Ponder SPTA 06/21/23 10:22 AM   [facility] [address] [place], [place], [address] Phone: [phone]   Fax:  [phone]

## 2023-06-23 ENCOUNTER — Ambulatory Visit: Payer: PRIVATE HEALTH INSURANCE | Admitting: Orthopaedic Surgery

## 2023-06-23 ENCOUNTER — Ambulatory Visit: Payer: PRIVATE HEALTH INSURANCE | Attending: Orthopaedic Surgery | Admitting: Physical Therapy

## 2023-06-23 ENCOUNTER — Encounter: Payer: Self-pay | Admitting: Orthopaedic Surgery

## 2023-06-23 ENCOUNTER — Encounter: Payer: Self-pay | Admitting: Physical Therapy

## 2023-06-23 DIAGNOSIS — R262 Difficulty in walking, not elsewhere classified: Secondary | ICD-10-CM | POA: Insufficient documentation

## 2023-06-23 DIAGNOSIS — M25562 Pain in left knee: Secondary | ICD-10-CM | POA: Diagnosis present

## 2023-06-23 DIAGNOSIS — M6281 Muscle weakness (generalized): Secondary | ICD-10-CM | POA: Insufficient documentation

## 2023-06-23 DIAGNOSIS — M25662 Stiffness of left knee, not elsewhere classified: Secondary | ICD-10-CM | POA: Insufficient documentation

## 2023-06-23 DIAGNOSIS — Z96652 Presence of left artificial knee joint: Secondary | ICD-10-CM

## 2023-06-23 NOTE — Progress Notes (Signed)
 The patient is now around 6 weeks status post a left total knee replacement.  She is only 58 years old.  She is doing great.  She does have pain at night.  She is self-employed and back to doing a lot of activities.  She walks without assist device.  She reports improvement in range of motion and strength.  On exam her left knee extension is full and her flexion is almost full.  The knee feels ligamentously stable.  From my standpoint she will continue increase her activities as comfort allows.  Will see her back in 3 months with a standing AP and lateral of her left knee.  If there are issues before then she will let us  know.

## 2023-06-23 NOTE — Therapy (Signed)
 OUTPATIENT PHYSICAL THERAPY LOWER EXTREMITY/DISCHARGE    Patient Name: Abigail Wright MRN: [id number] DOB:[birth year], 58 y.o., female Today's Date: 06/23/2023  PHYSICAL THERAPY DISCHARGE SUMMARY  Visits from Start of Care: 11  Current functional level related to goals / functional outcomes: See below    Remaining deficits: See below    Education / Equipment: See below    Patient agrees to discharge. Patient goals were met. Patient is being discharged due to meeting the stated rehab goals.   END OF SESSION:       Past Medical History:  Diagnosis Date   ADD (attention deficit disorder)    Anxiety    Arthritis    Blood transfusion without reported diagnosis    1993   Depression    GERD (gastroesophageal reflux disease)    in past but recently controlled with diet and exercise.   Hyperlipidemia    Hypertension    Pneumonia    Past Surgical History:  Procedure Laterality Date   ABDOMINAL HYSTERECTOMY  1993   BUNIONECTOMY Left    CARPAL TUNNEL RELEASE Bilateral    cyst removal from left hand Bilateral    FOOT SURGERY Left    plantar fascicitis surgery   KNEE CARTILAGE SURGERY Left    SHOULDER ARTHROSCOPY WITH SUBACROMIAL DECOMPRESSION AND OPEN ROTATOR C Right 07/07/2021   Procedure: RIGHT SHOULDER ARTHROSCOPY WITH SUBACROMIAL DECOMPRESSION / ROTATOR CUFF REPAIR/ DISTAL CLAVICLE RESECTION /BICEPS TENODESIS;  Surgeon: Shirlee Dotter, MD;  Location: [facility];  Service: Orthopedics;  Laterality: Right;   TENDON REPAIR Left 03/21/2017   Left hand    TONSILLECTOMY     TOTAL KNEE ARTHROPLASTY Left 05/13/2023   Procedure: LEFT TOTAL KNEE ARTHROPLASTY;  Surgeon: Arnie Lao, MD;  Location: [facility];  Service: Orthopedics;  Laterality: Left;   Patient Active Problem List   Diagnosis Date Noted   Status post total left knee replacement 05/13/2023   Chronic heel pain, right 01/27/2023   Pain in right knee 08/04/2021   Olecranon bursitis, right elbow  08/04/2021   Complete tear of right rotator cuff 07/14/2021   Impingement syndrome of right shoulder 06/17/2021   Major depressive disorder, recurrent episode, moderate (HCC) 03/06/2021   Pain in left knee 11/04/2017   Benign paroxysmal positional vertigo of left ear 05/15/2015   Family history of breast cancer in mother 08/02/2011    PCP: Jobe Mulder DO   REFERRING PROVIDER: Arnie Lao, MD  REFERRING DIAG: [phone] (ICD-10-CM) - Status post total left knee replacement M17.12 (ICD-10-CM) - Unilateral primary osteoarthritis, left knee  THERAPY DIAG:  No diagnosis found.  Rationale for Evaluation and Treatment: Rehabilitation  ONSET DATE:  surgery 05/13/23  SUBJECTIVE:   SUBJECTIVE STATEMENT:    Knee is sore from PT workouts, seeing the MD at 1415 today. I've had my grand daughter a lot this week, its been a lot of chasing and not as much icing/elevating   PERTINENT HISTORY: See above  PAIN:  Are you having pain? 2/10  PRECAUTIONS: Fall  RED FLAGS: None   WEIGHT BEARING RESTRICTIONS: No  FALLS:  Has patient fallen in last 6 months? No  LIVING ENVIRONMENT: Lives with: lives with their spouse Lives in: House/apartment Stairs: 2 story home, has ramp in the back of the house + 1-2 steps with rail to enter home. 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    PATIENT SURVEYS:    Patient-Specific Activity Scoring Scheme  "0" represents "unable to perform." "10" represents "able  to perform at prior level. 0 1 2 3 4 5 6 7 8 9  10 (Date and Score)   Activity Eval  06/23/23   1. Getting onto the floor to play with grandkids  0   8  2. Stairs  5   8  3. Standing for a long period of time  5 10  4.    5.    Score 3.3 8.7   Total score = sum of the activity scores/number of activities Minimum detectable change (90%CI) for average score = 2 points Minimum detectable change (90%CI) for single activity score = 3 points     COGNITION: Overall cognitive status: Within functional limits for tasks assessed       EDEMA:   As appropriate given surgery 5 days ago     LOWER EXTREMITY ROM:  Active ROM Left eval Left 06/07/23 Left 06/23/23  Hip flexion     Hip extension     Hip abduction     Hip adduction     Hip internal rotation     Hip external rotation     Knee flexion 70* supine heel slide  105* seated flexion AROM  118* knee flexion seated   Knee extension 15* supine with heel prop 6* LAQ, mm tremulous and weak  3* LAQ   Ankle dorsiflexion     Ankle plantarflexion     Ankle inversion     Ankle eversion      (Blank rows = not tested)  LOWER EXTREMITY MMT:  MMT Right eval Left eval 06/23/23  Hip flexion 5 3- 5  Hip extension     Hip abduction     Hip adduction     Hip internal rotation     Hip external rotation     Knee flexion 5 Deferred, knee pain   5  Knee extension 5 3 in available ROM, did not provide manual resistance due to pain levels  4+  Ankle dorsiflexion     Ankle plantarflexion     Ankle inversion     Ankle eversion      (Blank rows = not tested)    FUNCTIONAL TESTS:  5 times sit to stand: 13 seconds no UEs high mat table  Timed up and go (TUG): 28 seconds RW  3 minute walk test: 339ft RW   GAIT: Distance walked: 358ft  Assistive device utilized: Environmental consultant - 2 wheeled Level of assistance: Modified independence Comments: limited L knee ROM with gait cycle, heavy reliance on RW with UEs, mildly antalgic  TREATMENT DATE:     06/23/23  Objective measures and goal review, education on progress with PT and education on appropriate advanced HEP and progression of elliptical moving forward   Elliptical L2 x8 minutes (4 min forward/4 min backward)       06/21/23 Elliptical L2  each way PROM R knee Knee ext BLE 20# x10, LLE 2x10 10# HS curl 25# BLE x10, LLE 2x10 20# 6in 3pt step down 30# resisted side step squats x8 each way LLE on Dynadisc  step over  Tap targets Squats on dynadisc x10 Tibiofibular Jt mobs HS, piriformis stretch    06/16/23  Elliptical L1 x8 minutes- 6 min forward, 2 backward 3 way taps off blue foam pad x10 B avoiding knee hyperextension  TKEs black TB x15  Standing HS stretch 2x30 seconds B TM belt pushes 2x5 B Stool pushes forward and backwards x3 rounds Knee flexion stretch on steps 15x3 seconds  Education and encouragement about her progress with PT following TKR surgery, POC to likely DC next week with advanced HEP, general guidelines on progressing herself on the elliptical at home and signs of over doing          06/14/23 Nustep L 6 Manual resisted TKE AROM seated 0-110 Blue tband HS curl LLE 10 x 5# hip flex-knee ext and lower 10 x LLE 5# standing HS curl 15 x 5# hip 3 way 10 x 5# stepping over 8 in fwd and SW 10 x each BOSU step fwd and laterally 10x SL vector taps Sit fit squats 15 x Leg press DL and SL  . Calf raises 50# 2 sets 15 KNee ext SL 10 # 10 x HS 20# 10x SL  06/08/23  Scifit bike seat 12--> 10 for 10 minutes for w/u and ROM Wall squats x10 self selected depth cues for midline/equal WB Forward step ups 6 inch step x15 B HS stretch 3x30 L seconds on steps Knee flexion stretch on 12 inch step x5 second holds   Double step tap on blue foam pad x10 B Cross midline taps to targets from blue foam x10 B  multi level   Retrograde massage for edema control Roller to L quads (percussion gun not available)      PATIENT EDUCATION:  Education details: see above  Person educated: Patient and Spouse Education method: Explanation, Demonstration, and Handouts Education comprehension: verbalized understanding, returned demonstration, and needs further education  HOME EXERCISE PROGRAM:  Access Code: [email or web address] URL: [email or web address] Date: 06/23/2023 Prepared by: Terrel Ferries  Exercises - Hooklying Hamstring Stretch with Strap  - 1 x daily - 7 x weekly - 1 sets - 3 reps - 30 seconds  hold - Supine Quadriceps Stretch with Strap on Table  - 1 x daily - 7 x weekly - 1 sets - 3 reps - 30 seconds  hold - Seated Long Arc Quad with Ankle Weight  - 1 x daily - 4-5 x weekly - 2 sets - 10 reps - 3 seconds  hold - Prone Hamstring Curl with Ankle Weight  - 1 x daily - 4-5 x weekly - 2 sets - 10 reps - 3 seconds hold - Sidelying Hip Abduction  - 1 x daily - 7 x weekly - 2 sets - 10 reps - 1 second  hold - Standing Hip Hiking  - 1 x daily - 7 x weekly - 2 sets - 10 reps - 3 seconds  hold - Forward Step Up  - 1 x daily -  4-5 x weekly - 2 sets - 10 reps - Lateral Step Up  - 1 x daily - 4-5 x weekly - 2 sets - 10 reps - Forward Step Down Touch with Heel  - 1 x daily - 4-5 x weekly - 2 sets - 10 reps - Wall Squat  - 1 x daily - 4-5 x weekly - 2 sets - 10 reps   ASSESSMENT:  CLINICAL IMPRESSION:   Pt arrives today doing well- updated all objective measures and goals today. She has made EXCELLENT progress with skilled PT services and is 100% ready for DC at this time. Updated HEP as appropriate, she was in full understanding of all exercises and education given today. DC, thank you for the opportunity to participate in her care, it has been a pleasure to work with her!    OBJECTIVE IMPAIRMENTS: Abnormal gait, decreased activity tolerance, decreased balance, decreased knowledge of use of  DME, decreased mobility, difficulty walking, decreased ROM, decreased strength, increased edema, increased fascial restrictions, impaired flexibility, obesity, and pain.   ACTIVITY LIMITATIONS: sitting, standing, squatting, stairs, transfers, and locomotion level  PARTICIPATION LIMITATIONS: driving, shopping, community activity, occupation, and yard work  PERSONAL FACTORS: Age, Education, Fitness, Past/current experiences, Social background, and Time since onset of injury/illness/exacerbation are also affecting patient's functional outcome.   REHAB POTENTIAL: Good  CLINICAL DECISION MAKING: Stable/uncomplicated  EVALUATION COMPLEXITY: Low   GOALS: Goals reviewed with patient? No  SHORT TERM GOALS: Target date: 06/15/2023   Will be compliant with appropriate progressive HEP Goal status: 05/27/23 MET   2. L knee AROM flexion to be at least 100* and extension AROM to be no more than 5*  Goal status: 05/27/23 progressing   05/30/23 MET   3. Will be independent with edema management strategies  Goal status: 05/27/23 MET   4. Gait pattern to have normalized with LRAD Goal status: met 06/23/23   LONG TERM GOALS: Target date: 07/13/2023    MMT to have improved by one grade all weak groups Goal status: met 06/23/23   2. Knee AROM to be WNL and pain free Goal status: met 06/23/23   3. Will be able to ascend and descend stairs reciprocally with no increase in pain Goal status: met 06/23/23   4. Will be able to ambulate community distances and perform all household tasks with no increase in pain  Goal status: met 06/23/23   5. PSFS  to have improved by at least 3 points to show improved QOL and subjective improvement  Goal status:  met 06/23/23  6. Will be able to get down to the floor to play with and take care of grand-kids Goal status: met 06/23/23    PLAN:  PT FREQUENCY: 2x/week  PT DURATION: 8 weeks  PLANNED INTERVENTIONS: 97164- PT Re-evaluation, 97110-Therapeutic exercises,  97530- Therapeutic activity, 97112- Neuromuscular re-education, 97535- Self Care, 16109- Manual therapy, [phone]- Gait training, [phone]- Orthotic Fit/training, [phone]- Vasopneumatic device, Patient/Family education, Balance training, Stair training, Taping, Dry Needling, Cryotherapy, and Moist heat  PLAN FOR NEXT SESSION: DC today     Terrel Ferries, PT, DPT 06/23/23 12:00 PM   [facility] [address] [facility]. [place], [place], [address] Phone: [phone]   Fax:  [phone]

## 2023-07-04 ENCOUNTER — Other Ambulatory Visit: Payer: Self-pay | Admitting: Orthopaedic Surgery

## 2023-07-13 ENCOUNTER — Other Ambulatory Visit: Payer: Self-pay | Admitting: Physician Assistant

## 2023-07-16 ENCOUNTER — Other Ambulatory Visit: Payer: Self-pay | Admitting: Orthopaedic Surgery

## 2023-08-12 ENCOUNTER — Other Ambulatory Visit: Payer: Self-pay | Admitting: Orthopaedic Surgery

## 2023-08-29 ENCOUNTER — Other Ambulatory Visit: Payer: Self-pay | Admitting: Adult Health

## 2023-08-29 DIAGNOSIS — F411 Generalized anxiety disorder: Secondary | ICD-10-CM

## 2023-09-20 ENCOUNTER — Encounter: Payer: Self-pay | Admitting: Adult Health

## 2023-09-20 ENCOUNTER — Telehealth: Admitting: Adult Health

## 2023-09-20 DIAGNOSIS — F32A Depression, unspecified: Secondary | ICD-10-CM

## 2023-09-20 DIAGNOSIS — F411 Generalized anxiety disorder: Secondary | ICD-10-CM

## 2023-09-20 DIAGNOSIS — F909 Attention-deficit hyperactivity disorder, unspecified type: Secondary | ICD-10-CM | POA: Diagnosis not present

## 2023-09-20 DIAGNOSIS — F419 Anxiety disorder, unspecified: Secondary | ICD-10-CM | POA: Diagnosis not present

## 2023-09-20 DIAGNOSIS — F331 Major depressive disorder, recurrent, moderate: Secondary | ICD-10-CM

## 2023-09-20 MED ORDER — CLONAZEPAM 0.5 MG PO TABS: 0.5000 mg | ORAL_TABLET | Freq: Every day | ORAL | 2 refills | Status: DC

## 2023-09-20 MED ORDER — CITALOPRAM HYDROBROMIDE 20 MG PO TABS
ORAL_TABLET | ORAL | 3 refills | Status: AC
Start: 2023-09-20 — End: ?

## 2023-09-20 MED ORDER — AMPHETAMINE-DEXTROAMPHETAMINE 30 MG PO TABS: 30.0000 mg | ORAL_TABLET | Freq: Two times a day (BID) | ORAL | 0 refills | Status: DC

## 2023-09-20 MED ORDER — BUPROPION HCL ER (XL) 300 MG PO TB24
300.0000 mg | ORAL_TABLET | Freq: Every day | ORAL | 3 refills | Status: AC
Start: 2023-09-20 — End: ?

## 2023-09-20 NOTE — Progress Notes (Signed)
 Abigail Wright [id number] 05-28-1965 58 y.o.  Virtual Visit via Video Note  I connected with pt @ on 09/20/23 at 12:00 PM EDT by a video enabled telemedicine application and verified that I am speaking with the correct person using two identifiers.   I discussed the limitations of evaluation and management by telemedicine and the availability of in person appointments. The patient expressed understanding and agreed to proceed.  I discussed the assessment and treatment plan with the patient. The patient was provided an opportunity to ask questions and all were answered. The patient agreed with the plan and demonstrated an understanding of the instructions.   The patient was advised to call back or seek an in-person evaluation if the symptoms worsen or if the condition fails to improve as anticipated.  I provided 15 minutes of non-face-to-face time during this encounter.  The patient was located at home.  The provider was located at [facility].   Angeline LOISE Sayers, NP   Subjective:   Patient ID:  Abigail Wright is a 58 y.o. (DOB Sep 24, 1965) female.  Chief Complaint: No chief complaint on file.   HPI Abigail Wright presents for follow-up of anxiety, ADHD and depression.  Describes mood today as ok. Pleasant. Denies tearfulness. Mood symptoms - denies depression, anxiety and irritability. Reports stable interest and motivation. Denies panic attacks. Denies worry, rumination and over thinking. Reports mood is stable. Stating I feel like I'm doing alright. Feels like medications continue to work well. Taking medications as prescribed.  Energy levels lower - feeling tired lately. Active, does not have a regular exercise routine.  Enjoys some usual interests and activities. Married. Lives with husband. Spending time with family - 2 daughters - 2 grand daughters. Appetite adequate. Weight stable.  Sleeps well most nights. Averages 7 to 8  hours.  Focus and  concentration stable. Completing tasks. 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   Denies SI or HI.  Denies AH or VH. Denies self harm.  Denies substance use.    Review of Systems:  Review of Systems  Musculoskeletal:  Negative for gait problem.  Neurological:  Negative for tremors.  Psychiatric/Behavioral:         Please refer to HPI    Medications: I have reviewed the patient's current medications.  Current Outpatient Medications  Medication Sig Dispense Refill   amphetamine -dextroamphetamine  (ADDERALL) 30 MG tablet Take 1 tablet by mouth 2 (two) times daily. 60 tablet 0   amphetamine -dextroamphetamine  (ADDERALL) 30 MG tablet Take 1 tablet by mouth 2 (two) times daily. 60 tablet 0   amphetamine -dextroamphetamine  (ADDERALL) 30 MG tablet Take 1 tablet by mouth 2 (two) times daily. 60 tablet 0   ASPIRIN  LOW DOSE 81 MG chewable tablet CHEW AND SWALLOW ONE TABLET TWICE A DAY AS DIRECTED 30 tablet 0   atorvastatin  (LIPITOR) 40 MG tablet Take 1 tablet (40 mg total) by mouth daily. 90 tablet 3   Biotin-Vitamin C (HAIR SKIN NAILS GUMMIES PO) Take 2 tablets by mouth in the morning.     buPROPion  (WELLBUTRIN  XL) 300 MG 24 hr tablet Take 1 tablet (300 mg total) by mouth daily. 90 tablet 3   carboxymethylcellulose (REFRESH TEARS) 0.5 % SOLN Place 1 drop into both eyes in the morning.     citalopram  (CELEXA ) 20 MG tablet TAKE 1 TABLET BY MOUTH EACH DAY (Patient taking differently: Take 20 mg by mouth at bedtime.) 90 tablet 3   clonazePAM  (KLONOPIN ) 0.5 MG tablet TAKE 1  TABLET BY MOUTH AT BEDTIME 30 tablet 0   diclofenac  (VOLTAREN ) 75 MG EC tablet TAKE 1 TABLET BY MOUTH 2 TIMES A DAY WITH MEALS AS NEEDED FOR MILD (1-3) OR MODERATE (4-6) PAIN 60 tablet 0   fluticasone (FLONASE) 50 MCG/ACT nasal spray Place 1 spray into both nostrils in the morning.     ketorolac  (TORADOL ) 10 MG tablet TAKE 1 TABLET BY MOUTH EVERY 6 HOURS WITH FOOD AS NEEDED FOR PAIN & INFLAMMATION 20 tablet  0   Loratadine  10 MG CAPS Take 10 mg by mouth daily.     Multiple Vitamin (MULTIVITAMIN WITH MINERALS) TABS tablet Take 1 tablet by mouth in the morning. One A Day for Women 50+     omeprazole (PRILOSEC OTC) 20 MG tablet Take 20 mg by mouth daily before breakfast.     oxyCODONE  (OXY IR/ROXICODONE ) 5 MG immediate release tablet TAKE 1 TO 2 TABLETS BY MOUTH EVERY 6 HOURS AS NEEDED FOR PAIN 30 tablet 0   Psyllium (METAMUCIL PO) Take 3 tablets by mouth in the morning. Metamucil Fiber Gummies Plus Probiotics for Digestive Health     tiZANidine  (ZANAFLEX ) 4 MG tablet Take 1 tablet (4 mg total) by mouth every 6 (six) hours as needed for muscle spasms. 30 tablet 0   No current facility-administered medications for this visit.    Medication Side Effects: None  Allergies: No Known Allergies  Past Medical History:  Diagnosis Date   ADD (attention deficit disorder)    Anxiety    Arthritis    Blood transfusion without reported diagnosis    1993   Depression    GERD (gastroesophageal reflux disease)    in past but recently controlled with diet and exercise.   Hyperlipidemia    Hypertension    Pneumonia     Family History  Problem Relation Age of Onset   Cancer Mother    Diabetes Father    Heart disease Paternal Uncle    Hypertension Paternal Uncle    Colon cancer Neg Hx    Esophageal cancer Neg Hx    Rectal cancer Neg Hx    Stomach cancer Neg Hx    Stroke Neg Hx     Social History   Socioeconomic History   Marital status: Married    Spouse name: Not on file   Number of children: Not on file   Years of education: Not on file   Highest education level: Associate degree: occupational, Scientist, product/process development, or vocational program  Occupational History   Not on file  Tobacco Use   Smoking status: Former   Smokeless tobacco: Never  Vaping Use   Vaping status: Every Day   Substances: Nicotine, Flavoring  Substance and Sexual Activity   Alcohol  use: Yes    Comment: occasionally   Drug  use: No   Sexual activity: Yes    Partners: Male    Birth control/protection: None, Post-menopausal  Other Topics Concern   Not on file  Social History Narrative   Pt lives husband    Pt works    Social Drivers of Corporate investment banker Strain: Low Risk  (04/27/2023)   Overall Financial Resource Strain (CARDIA)    Difficulty of Paying Living Expenses: Not very hard  Food Insecurity: No Food Insecurity (05/13/2023)   Hunger Vital Sign    Worried About Running Out of Food in the Last Year: Never true    Ran Out of Food in the Last Year: Never true  Transportation Needs: No Transportation  Needs (05/13/2023)   PRAPARE - Administrator, Civil Service (Medical): No    Lack of Transportation (Non-Medical): No  Physical Activity: Insufficiently Active (04/27/2023)   Exercise Vital Sign    Days of Exercise per Week: 2 days    Minutes of Exercise per Session: 20 min  Stress: No Stress Concern Present (04/27/2023)   [employer] of Occupational Health - Occupational Stress Questionnaire    Feeling of Stress : Only a little  Social Connections: Moderately Integrated (04/27/2023)   Social Connection and Isolation Panel    Frequency of Communication with Friends and Family: More than three times a week    Frequency of Social Gatherings with Friends and Family: Once a week    Attends Religious Services: 1 to 4 times per year    Active Member of [employer] or Organizations: No    Attends Banker Meetings: Not on file    Marital Status: Married  Catering manager Violence: Not At Risk (05/13/2023)   Humiliation, Afraid, Rape, and Kick questionnaire    Fear of Current or Ex-Partner: No    Emotionally Abused: No    Physically Abused: No    Sexually Abused: No    Past Medical History, Surgical history, Social history, and Family history were reviewed and updated as appropriate.   Please see review of systems for further details on the patient's review from today.    Objective:   Physical Exam:  There were no vitals taken for this visit.  Physical Exam Constitutional:      General: She is not in acute distress. Musculoskeletal:        General: No deformity.  Neurological:     Mental Status: She is alert and oriented to person, place, and time.     Coordination: Coordination normal.  Psychiatric:        Attention and Perception: Attention and perception normal. She does not perceive auditory or visual hallucinations.        Mood and Affect: Mood normal. Mood is not anxious or depressed. Affect is not labile, blunt, angry or inappropriate.        Speech: Speech normal.        Behavior: Behavior normal.        Thought Content: Thought content normal. Thought content is not paranoid or delusional. Thought content does not include homicidal or suicidal ideation. Thought content does not include homicidal or suicidal plan.        Cognition and Memory: Cognition and memory normal.        Judgment: Judgment normal.     Comments: Insight intact     Lab Review:     Component Value Date/Time   NA 139 05/14/2023 0339   K 4.1 05/14/2023 0339   CL 105 05/14/2023 0339   CO2 28 05/14/2023 0339   GLUCOSE 137 (H) 05/14/2023 0339   BUN 17 05/14/2023 0339   CREATININE 0.77 05/14/2023 0339   CALCIUM  8.9 05/14/2023 0339   PROT 7.1 05/04/2023 1033   ALBUMIN 4.7 05/04/2023 1033   AST 21 05/04/2023 1033   ALT 35 05/04/2023 1033   ALKPHOS 101 05/04/2023 1033   BILITOT 0.5 05/04/2023 1033   GFRNONAA >60 05/14/2023 0339       Component Value Date/Time   WBC 8.8 05/14/2023 0339   RBC 4.48 05/14/2023 0339   HGB 12.7 05/14/2023 0339   HCT 41.0 05/14/2023 0339   PLT 218 05/14/2023 0339   MCV 91.5 05/14/2023 0339  MCH 28.3 05/14/2023 0339   MCHC 31.0 05/14/2023 0339   RDW 14.1 05/14/2023 0339   LYMPHSABS 2.7 05/04/2023 1033   MONOABS 0.7 05/04/2023 1033   EOSABS 0.1 05/04/2023 1033   BASOSABS 0.0 05/04/2023 1033    No results found for:  POCLITH, LITHIUM   No results found for: PHENYTOIN, PHENOBARB, VALPROATE, CBMZ   .res Assessment: Plan:    Plan:  1. Adderall 30mg  BID 2. Celexa  20 mg daily 3. Wellbutrin  XL 300mg  4. Clonazepam  0.5mg  daily   Continue to monitor BP - WNL  RTC 3 months  15 minutes spent dedicated to the care of this patient on the date of this encounter to include pre-visit review of records, ordering of medication, post visit documentation, and face-to-face time with the patient discussing depression, anxiety, and insomnia. Discussed continuing current medication regimen.  Patient advised to contact office with any questions, adverse effects, or acute worsening in signs and symptoms.  Discussed potential benefits, risk, and side effects of benzodiazepines to include potential risk of tolerance and dependence, as well as possible drowsiness. Advised patient not to drive if experiencing drowsiness and to take lowest possible effective dose to minimize risk of dependence and tolerance.  Discussed potential benefits, risks, and side effects of stimulants with patient to include increased heart rate, palpitations, insomnia, increased anxiety, increased irritability, or decreased appetite. Instructed patient to contact office if experiencing any significant tolerability issues. There are no diagnoses linked to this encounter.   Please see After Visit Summary for patient specific instructions.  Future Appointments  Date Time Provider Department Center  09/20/2023 12:00 PM Drinda Belgard, Angeline Mattocks, NP [facility] None  09/26/2023  1:00 PM Vernetta Lonni GRADE, MD [facility] None    No orders of the defined types were placed in this encounter.     -------------------------------

## 2023-09-21 ENCOUNTER — Other Ambulatory Visit: Payer: Self-pay | Admitting: Orthopaedic Surgery

## 2023-09-26 ENCOUNTER — Encounter: Payer: Self-pay | Admitting: Orthopaedic Surgery

## 2023-09-26 ENCOUNTER — Ambulatory Visit (INDEPENDENT_AMBULATORY_CARE_PROVIDER_SITE_OTHER): Payer: PRIVATE HEALTH INSURANCE | Admitting: Orthopaedic Surgery

## 2023-09-26 ENCOUNTER — Other Ambulatory Visit (INDEPENDENT_AMBULATORY_CARE_PROVIDER_SITE_OTHER): Payer: Self-pay

## 2023-09-26 DIAGNOSIS — Z96652 Presence of left artificial knee joint: Secondary | ICD-10-CM | POA: Diagnosis not present

## 2023-09-26 NOTE — Progress Notes (Signed)
 The patient is now 4-1/2 months status post a left total knee replacement to treat significant left knee pain and arthritis.  She is an active 58 year old female.  She reports just some swelling but overall says she is doing well and has been range of motion and strength.  On exam she only has minimal swelling of the left knee which is better than a lot that we see.  Her range of motion is entirely full and the knee feels stable on exam.  2 views of the left knee show no complicating features of the knee arthroplasty.  She will continue to increase her activities as comfort allows working on quad training exercises.  We would like to see her back in 6 months with a repeat 2 views of her left knee since these are press-fit implants.

## 2023-11-02 ENCOUNTER — Other Ambulatory Visit: Payer: Self-pay | Admitting: Orthopaedic Surgery

## 2023-11-02 ENCOUNTER — Other Ambulatory Visit: Payer: Self-pay | Admitting: Family Medicine

## 2023-11-02 DIAGNOSIS — Z8673 Personal history of transient ischemic attack (TIA), and cerebral infarction without residual deficits: Secondary | ICD-10-CM

## 2023-12-05 ENCOUNTER — Other Ambulatory Visit: Payer: Self-pay | Admitting: Orthopaedic Surgery

## 2023-12-20 ENCOUNTER — Telehealth: Admitting: Adult Health

## 2023-12-20 ENCOUNTER — Encounter: Payer: Self-pay | Admitting: Adult Health

## 2023-12-20 DIAGNOSIS — F909 Attention-deficit hyperactivity disorder, unspecified type: Secondary | ICD-10-CM | POA: Diagnosis not present

## 2023-12-20 DIAGNOSIS — F32A Depression, unspecified: Secondary | ICD-10-CM | POA: Diagnosis not present

## 2023-12-20 DIAGNOSIS — F331 Major depressive disorder, recurrent, moderate: Secondary | ICD-10-CM

## 2023-12-20 DIAGNOSIS — F419 Anxiety disorder, unspecified: Secondary | ICD-10-CM | POA: Diagnosis not present

## 2023-12-20 DIAGNOSIS — F411 Generalized anxiety disorder: Secondary | ICD-10-CM

## 2023-12-20 MED ORDER — CLONAZEPAM 0.5 MG PO TABS: 0.5000 mg | ORAL_TABLET | Freq: Every day | ORAL | 2 refills | Status: DC

## 2023-12-20 MED ORDER — AMPHETAMINE-DEXTROAMPHETAMINE 30 MG PO TABS: 30.0000 mg | ORAL_TABLET | Freq: Two times a day (BID) | ORAL | 0 refills | Status: DC

## 2023-12-20 NOTE — Progress Notes (Signed)
 Abigail Wright [id number] 1965-10-05 58 y.o.  Virtual Visit via Video Note  I connected with pt @ on 12/20/23 at 12:00 PM EDT by a video enabled telemedicine application and verified that I am speaking with the correct person using two identifiers.   I discussed the limitations of evaluation and management by telemedicine and the availability of in person appointments. The patient expressed understanding and agreed to proceed.  I discussed the assessment and treatment plan with the patient. The patient was provided an opportunity to ask questions and all were answered. The patient agreed with the plan and demonstrated an understanding of the instructions.   The patient was advised to call back or seek an in-person evaluation if the symptoms worsen or if the condition fails to improve as anticipated.  I provided 15 minutes of non-face-to-face time during this encounter.  The patient was located at home.  The provider was located at [facility].   Angeline LOISE Sayers, NP   Subjective:   Patient ID:  Abigail Wright is a 58 y.o. (DOB [birth year]) female.  Chief Complaint: No chief complaint on file.   HPI Abigail Wright presents for follow-up of anxiety, ADHD and depression.  Describes mood today as ok. Pleasant. Denies tearfulness. Mood symptoms - denies depression, anxiety and irritability. Reports stable interest and motivation. Denies panic attacks. Denies worry, rumination and over thinking. Reports mood is stable. Stating I feel like I'm doing ok. Feels like medications continue to work well. Taking medications as prescribed.  Energy levels lower. Active, does not have a regular exercise routine.  Enjoys some usual interests and activities. Married. Lives with husband. Spending time with family - 2 daughters - 2 grand daughters. Appetite adequate. Weight stable.  Sleeps well most nights. Averages 7 to 8 hours.  Focus and concentration stable. Completing  tasks. 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   Denies SI or HI.  Denies AH or VH. Denies self harm.  Denies substance use.   Review of Systems:  Review of Systems  Musculoskeletal:  Negative for gait problem.  Neurological:  Negative for tremors.  Psychiatric/Behavioral:         Please refer to HPI    Medications: I have reviewed the patient's current medications.  Current Outpatient Medications  Medication Sig Dispense Refill   amphetamine -dextroamphetamine  (ADDERALL) 30 MG tablet Take 1 tablet by mouth 2 (two) times daily. 60 tablet 0   amphetamine -dextroamphetamine  (ADDERALL) 30 MG tablet Take 1 tablet by mouth 2 (two) times daily. 60 tablet 0   amphetamine -dextroamphetamine  (ADDERALL) 30 MG tablet Take 1 tablet by mouth 2 (two) times daily. 60 tablet 0   ASPIRIN  LOW DOSE 81 MG chewable tablet CHEW AND SWALLOW ONE TABLET TWICE A DAY AS DIRECTED 30 tablet 0   atorvastatin  (LIPITOR) 40 MG tablet Take 1 tablet (40 mg total) by mouth daily. 30 tablet 0   Biotin-Vitamin C (HAIR SKIN NAILS GUMMIES PO) Take 2 tablets by mouth in the morning.     buPROPion  (WELLBUTRIN  XL) 300 MG 24 hr tablet Take 1 tablet (300 mg total) by mouth daily. 90 tablet 3   carboxymethylcellulose (REFRESH TEARS) 0.5 % SOLN Place 1 drop into both eyes in the morning.     citalopram  (CELEXA ) 20 MG tablet Take one tablet by mouth daily. 90 tablet 3   clonazePAM  (KLONOPIN ) 0.5 MG tablet Take 1 tablet (0.5 mg total) by mouth at bedtime. 30 tablet 2   diclofenac  (VOLTAREN ) 75 MG  EC tablet TAKE 1 TABLET BY MOUTH 2 TIMES A DAY WITH MEALS AS NEEDED FOR MILD (1-3) OR MODERATE (4-6) PAIN 60 tablet 0   fluticasone (FLONASE) 50 MCG/ACT nasal spray Place 1 spray into both nostrils in the morning.     ketorolac  (TORADOL ) 10 MG tablet TAKE 1 TABLET BY MOUTH EVERY 6 HOURS WITH FOOD AS NEEDED FOR PAIN & INFLAMMATION 20 tablet 0   Loratadine  10 MG CAPS Take 10 mg by mouth daily.     Multiple Vitamin  (MULTIVITAMIN WITH MINERALS) TABS tablet Take 1 tablet by mouth in the morning. One A Day for Women 50+     omeprazole (PRILOSEC OTC) 20 MG tablet Take 20 mg by mouth daily before breakfast.     oxyCODONE  (OXY IR/ROXICODONE ) 5 MG immediate release tablet TAKE 1 TO 2 TABLETS BY MOUTH EVERY 6 HOURS AS NEEDED FOR PAIN 30 tablet 0   Psyllium (METAMUCIL PO) Take 3 tablets by mouth in the morning. Metamucil Fiber Gummies Plus Probiotics for Digestive Health     tiZANidine  (ZANAFLEX ) 4 MG tablet Take 1 tablet (4 mg total) by mouth every 6 (six) hours as needed for muscle spasms. 30 tablet 0   No current facility-administered medications for this visit.    Medication Side Effects: None  Allergies: No Known Allergies  Past Medical History:  Diagnosis Date   ADD (attention deficit disorder)    Anxiety    Arthritis    Blood transfusion without reported diagnosis    1993   Depression    GERD (gastroesophageal reflux disease)    in past but recently controlled with diet and exercise.   Hyperlipidemia    Hypertension    Pneumonia     Family History  Problem Relation Age of Onset   Cancer Mother    Diabetes Father    Heart disease Paternal Uncle    Hypertension Paternal Uncle    Colon cancer Neg Hx    Esophageal cancer Neg Hx    Rectal cancer Neg Hx    Stomach cancer Neg Hx    Stroke Neg Hx     Social History   Socioeconomic History   Marital status: Married    Spouse name: Not on file   Number of children: Not on file   Years of education: Not on file   Highest education level: Associate degree: occupational, scientist, product/process development, or vocational program  Occupational History   Not on file  Tobacco Use   Smoking status: Former   Smokeless tobacco: Never  Vaping Use   Vaping status: Every Day   Substances: Nicotine, Flavoring  Substance and Sexual Activity   Alcohol  use: Yes    Comment: occasionally   Drug use: No   Sexual activity: Yes    Partners: Male    Birth  control/protection: None, Post-menopausal  Other Topics Concern   Not on file  Social History Narrative   Pt lives husband    Pt works    Social Drivers of Corporate Investment Banker Strain: Low Risk  (04/27/2023)   Overall Financial Resource Strain (CARDIA)    Difficulty of Paying Living Expenses: Not very hard  Food Insecurity: No Food Insecurity (05/13/2023)   Hunger Vital Sign    Worried About Running Out of Food in the Last Year: Never true    Ran Out of Food in the Last Year: Never true  Transportation Needs: No Transportation Needs (05/13/2023)   PRAPARE - Administrator, Civil Service (Medical):  No    Lack of Transportation (Non-Medical): No  Physical Activity: Insufficiently Active (04/27/2023)   Exercise Vital Sign    Days of Exercise per Week: 2 days    Minutes of Exercise per Session: 20 min  Stress: No Stress Concern Present (04/27/2023)   [employer] of Occupational Health - Occupational Stress Questionnaire    Feeling of Stress : Only a little  Social Connections: Moderately Integrated (04/27/2023)   Social Connection and Isolation Panel    Frequency of Communication with Friends and Family: More than three times a week    Frequency of Social Gatherings with Friends and Family: Once a week    Attends Religious Services: 1 to 4 times per year    Active Member of [employer] or Organizations: No    Attends Banker Meetings: Not on file    Marital Status: Married  Catering Manager Violence: Not At Risk (05/13/2023)   Humiliation, Afraid, Rape, and Kick questionnaire    Fear of Current or Ex-Partner: No    Emotionally Abused: No    Physically Abused: No    Sexually Abused: No    Past Medical History, Surgical history, Social history, and Family history were reviewed and updated as appropriate.   Please see review of systems for further details on the patient's review from today.   Objective:   Physical Exam:  There were no vitals taken for  this visit.  Physical Exam Constitutional:      General: She is not in acute distress. Musculoskeletal:        General: No deformity.  Neurological:     Mental Status: She is alert and oriented to person, place, and time.     Coordination: Coordination normal.  Psychiatric:        Attention and Perception: Attention and perception normal. She does not perceive auditory or visual hallucinations.        Mood and Affect: Mood normal. Mood is not anxious or depressed. Affect is not labile, blunt, angry or inappropriate.        Speech: Speech normal.        Behavior: Behavior normal.        Thought Content: Thought content normal. Thought content is not paranoid or delusional. Thought content does not include homicidal or suicidal ideation. Thought content does not include homicidal or suicidal plan.        Cognition and Memory: Cognition and memory normal.        Judgment: Judgment normal.     Comments: Insight intact     Lab Review:     Component Value Date/Time   NA 139 05/14/2023 0339   K 4.1 05/14/2023 0339   CL 105 05/14/2023 0339   CO2 28 05/14/2023 0339   GLUCOSE 137 (H) 05/14/2023 0339   BUN 17 05/14/2023 0339   CREATININE 0.77 05/14/2023 0339   CALCIUM  8.9 05/14/2023 0339   PROT 7.1 05/04/2023 1033   ALBUMIN 4.7 05/04/2023 1033   AST 21 05/04/2023 1033   ALT 35 05/04/2023 1033   ALKPHOS 101 05/04/2023 1033   BILITOT 0.5 05/04/2023 1033   GFRNONAA >60 05/14/2023 0339       Component Value Date/Time   WBC 8.8 05/14/2023 0339   RBC 4.48 05/14/2023 0339   HGB 12.7 05/14/2023 0339   HCT 41.0 05/14/2023 0339   PLT 218 05/14/2023 0339   MCV 91.5 05/14/2023 0339   MCH 28.3 05/14/2023 0339   MCHC 31.0 05/14/2023 0339   RDW 14.1  05/14/2023 0339   LYMPHSABS 2.7 05/04/2023 1033   MONOABS 0.7 05/04/2023 1033   EOSABS 0.1 05/04/2023 1033   BASOSABS 0.0 05/04/2023 1033    No results found for: POCLITH, LITHIUM   No results found for: PHENYTOIN, PHENOBARB,  VALPROATE, CBMZ   .res Assessment: Plan:    Plan:  1. Adderall 30mg  BID 2. Celexa  20 mg daily 3. Wellbutrin  XL 300mg  4. Clonazepam  0.5mg  daily   Continue to monitor BP - WNL  RTC 3 months  15 minutes spent dedicated to the care of this patient on the date of this encounter to include pre-visit review of records, ordering of medication, post visit documentation, and face-to-face time with the patient discussing depression, anxiety, and insomnia. Discussed continuing current medication regimen.  Patient advised to contact office with any questions, adverse effects, or acute worsening in signs and symptoms.  Discussed potential benefits, risk, and side effects of benzodiazepines to include potential risk of tolerance and dependence, as well as possible drowsiness. Advised patient not to drive if experiencing drowsiness and to take lowest possible effective dose to minimize risk of dependence and tolerance.  Discussed potential benefits, risks, and side effects of stimulants with patient to include increased heart rate, palpitations, insomnia, increased anxiety, increased irritability, or decreased appetite. Instructed patient to contact office if experiencing any significant tolerability issues.  There are no diagnoses linked to this encounter.   Please see After Visit Summary for patient specific instructions.  Future Appointments  Date Time Provider Department Center  12/20/2023 12:00 PM Laketta Soderberg, Angeline Mattocks, NP [facility] None  03/28/2024  1:00 PM Vernetta Lonni GRADE, MD [facility] None    No orders of the defined types were placed in this encounter.     -------------------------------

## 2023-12-26 ENCOUNTER — Encounter: Payer: Self-pay | Admitting: Radiology

## 2024-01-08 ENCOUNTER — Encounter: Payer: Self-pay | Admitting: Family Medicine

## 2024-01-09 ENCOUNTER — Telehealth: Payer: Self-pay | Admitting: Gastroenterology

## 2024-01-09 NOTE — Telephone Encounter (Signed)
 The following patient is calling to speak to a nurse regarding her symptoms. She has had some bleeding along with severe abdominal pains and cramps. Please advise.

## 2024-01-09 NOTE — Telephone Encounter (Signed)
 Patient called to inquire about timing for her repeat colonoscopy. Confirmed that she is due in October 2029. Patient reports she has an appointment with her PCP on Friday to evaluate rectal bleeding and cramping. She will call back if her PCP recommends she be seen in our office. Advised patient that she has not been seen in our clinic since 2019 and should schedule a follow-up visit. Patient states she will call after her appointment on Friday.

## 2024-01-10 NOTE — Telephone Encounter (Signed)
 Copied from CRM #[id number]. Topic: General - Other >> Jan 10, 2024 12:13 PM Abigail Wright wrote: Reason for CRM: Pt is calling to inform Dr.Wendling that she hasn't had a BM in 2 days and is worried that it may turn into an infection. Pt stated that she sent a message to [facility] on MyChart and wanted to add this concern to the message. Pt does have an appt already scheduled on Friday 11/21 with Dr.Wendling.

## 2024-01-13 ENCOUNTER — Ambulatory Visit: Payer: PRIVATE HEALTH INSURANCE | Admitting: Family Medicine

## 2024-01-18 ENCOUNTER — Telehealth: Payer: PRIVATE HEALTH INSURANCE | Admitting: Family Medicine

## 2024-01-18 ENCOUNTER — Encounter: Payer: Self-pay | Admitting: Family Medicine

## 2024-01-18 DIAGNOSIS — R1030 Lower abdominal pain, unspecified: Secondary | ICD-10-CM | POA: Diagnosis not present

## 2024-01-18 MED ORDER — AMOXICILLIN-POT CLAVULANATE 875-125 MG PO TABS
1.0000 | ORAL_TABLET | Freq: Two times a day (BID) | ORAL | 0 refills | Status: AC
Start: 2024-01-18 — End: ?

## 2024-01-18 MED ORDER — DICYCLOMINE HCL 10 MG PO CAPS: ORAL_CAPSULE | ORAL | 0 refills | Status: AC

## 2024-01-18 NOTE — Progress Notes (Signed)
 Chief Complaint  Patient presents with   Diverticulitis    Diverticulitis follow up     Subjective Abigail Wright is a 58 y.o. female who presents with abd pain. We are interacting via web portal for an electronic face-to-face visit. I verified patient's ID using 2 identifiers. Patient agreed to proceed with visit via this method. Patient is in a parked car, I am at office. Patient and I are present for visit.  Symptoms began 1 week ago.  Patient has lower abdominal pain, diarrhea, and blood in stool Similar to previous bouts of diverticulitis. Patient denies cramping, vomiting, fever, arthralgias, and myalgias Treatment to date: none Sick contacts: none known  Past Medical History:  Diagnosis Date   ADD (attention deficit disorder)    Anxiety    Arthritis    Blood transfusion without reported diagnosis    1993   Depression    GERD (gastroesophageal reflux disease)    in past but recently controlled with diet and exercise.   Hyperlipidemia    Hypertension    Pneumonia     Exam No conversational dyspnea Age appropriate judgment and insight Nml affect and mood  Assessment and Plan  Lower abdominal pain - Plan: amoxicillin -clavulanate (AUGMENTIN ) 875-125 MG tablet, dicyclomine  (BENTYL ) 10 MG capsule  10 d of Augmentin .  Bentyl  as needed for cramping.  This is similar to previous diverticulitis flares.  If worsening symptoms or fevers, may have to seek immediate care. The patient voiced understanding and agreement to the plan.  Mabel [place], DO 01/18/24  2:41 PM

## 2024-02-06 ENCOUNTER — Other Ambulatory Visit: Payer: Self-pay | Admitting: Physician Assistant

## 2024-03-21 ENCOUNTER — Telehealth (INDEPENDENT_AMBULATORY_CARE_PROVIDER_SITE_OTHER): Payer: Self-pay | Admitting: Adult Health

## 2024-03-21 ENCOUNTER — Encounter: Payer: Self-pay | Admitting: Adult Health

## 2024-03-21 DIAGNOSIS — F419 Anxiety disorder, unspecified: Secondary | ICD-10-CM

## 2024-03-21 DIAGNOSIS — F331 Major depressive disorder, recurrent, moderate: Secondary | ICD-10-CM

## 2024-03-21 DIAGNOSIS — F411 Generalized anxiety disorder: Secondary | ICD-10-CM

## 2024-03-21 DIAGNOSIS — F909 Attention-deficit hyperactivity disorder, unspecified type: Secondary | ICD-10-CM

## 2024-03-21 DIAGNOSIS — G4700 Insomnia, unspecified: Secondary | ICD-10-CM

## 2024-03-21 DIAGNOSIS — F32A Depression, unspecified: Secondary | ICD-10-CM

## 2024-03-21 MED ORDER — AMPHETAMINE-DEXTROAMPHETAMINE 30 MG PO TABS: 30.0000 mg | ORAL_TABLET | Freq: Two times a day (BID) | ORAL | 0 refills | Status: AC

## 2024-03-21 MED ORDER — CLONAZEPAM 0.5 MG PO TABS: 0.5000 mg | ORAL_TABLET | Freq: Every day | ORAL | 2 refills | Status: AC

## 2024-03-21 NOTE — Progress Notes (Signed)
 Abigail Wright [id number] 1965-04-08 59 y.o.  Virtual Visit via Video Note  I connected with pt @ on 03/21/24 at 11:00 AM EST by a video enabled telemedicine application and verified that I am speaking with the correct person using two identifiers.   I discussed the limitations of evaluation and management by telemedicine and the availability of in person appointments. The patient expressed understanding and agreed to proceed.  I discussed the assessment and treatment plan with the patient. The patient was provided an opportunity to ask questions and all were answered. The patient agreed with the plan and demonstrated an understanding of the instructions.   The patient was advised to call back or seek an in-person evaluation if the symptoms worsen or if the condition fails to improve as anticipated.  I provided 15 minutes of non-face-to-face time during this encounter.  The patient was located at home.  The provider was located at [facility].   Angeline LOISE Sayers, NP   Subjective:   Patient ID:  Abigail Wright is a 59 y.o. (DOB Feb 05, 1966) female.  Chief Complaint: No chief complaint on file.   HPI Abigail Wright presents for follow-up of anxiety, ADHD, insomnia and depression.  Describes mood today as ok. Pleasant. Denies tearfulness. Mood symptoms - denies depression, anxiety and irritability. Reports stable interest and motivation. Denies panic attacks. Denies worry, rumination and over thinking. Reports mood is stable. Stating I feel like I'm doing alright. Feels like medications continue to work well. Taking medications as prescribed.  Energy levels improved. Active, does not have a regular exercise routine.  Enjoys some usual interests and activities. Married. Lives with husband. Spending time with family - 2 daughters - 2 grand daughters. Appetite adequate. Weight stable.  Sleeps well most nights. Averages 7 to 8 hours.  Focus and concentration  stable. Completing tasks. 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   Denies SI or HI.  Denies AH or VH. Denies self harm.  Denies substance use.  Review of Systems:  Review of Systems  Musculoskeletal:  Negative for gait problem.  Neurological:  Negative for tremors.  Psychiatric/Behavioral:         Please refer to HPI    Medications: I have reviewed the patient's current medications.  Current Outpatient Medications  Medication Sig Dispense Refill   amoxicillin -clavulanate (AUGMENTIN ) 875-125 MG tablet Take 1 tablet by mouth 2 (two) times daily. 20 tablet 0   amphetamine -dextroamphetamine  (ADDERALL) 30 MG tablet Take 1 tablet by mouth 2 (two) times daily. 60 tablet 0   amphetamine -dextroamphetamine  (ADDERALL) 30 MG tablet Take 1 tablet by mouth 2 (two) times daily. 60 tablet 0   amphetamine -dextroamphetamine  (ADDERALL) 30 MG tablet Take 1 tablet by mouth 2 (two) times daily. 60 tablet 0   ASPIRIN  LOW DOSE 81 MG chewable tablet CHEW AND SWALLOW ONE TABLET TWICE A DAY AS DIRECTED 30 tablet 0   atorvastatin  (LIPITOR) 40 MG tablet Take 1 tablet (40 mg total) by mouth daily. 30 tablet 0   Biotin-Vitamin C (HAIR SKIN NAILS GUMMIES PO) Take 2 tablets by mouth in the morning.     buPROPion  (WELLBUTRIN  XL) 300 MG 24 hr tablet Take 1 tablet (300 mg total) by mouth daily. 90 tablet 3   carboxymethylcellulose (REFRESH TEARS) 0.5 % SOLN Place 1 drop into both eyes in the morning.     citalopram  (CELEXA ) 20 MG tablet Take one tablet by mouth daily. 90 tablet 3   clonazePAM  (KLONOPIN ) 0.5 MG tablet  Take 1 tablet (0.5 mg total) by mouth at bedtime. 30 tablet 2   diclofenac  (VOLTAREN ) 75 MG EC tablet TAKE 1 TABLET BY MOUTH 2 TIMES A DAY WITH MEALS AS NEEDED FOR MILD (1-3) OR MODERATE (4-6) PAIN 60 tablet 0   dicyclomine  (BENTYL ) 10 MG capsule Take 1 tab every 6 hours as needed for abdominal cramping. 30 capsule 0   fluticasone (FLONASE) 50 MCG/ACT nasal spray Place 1 spray into  both nostrils in the morning.     ketorolac  (TORADOL ) 10 MG tablet TAKE 1 TABLET BY MOUTH EVERY 6 HOURS WITH FOOD AS NEEDED FOR PAIN & INFLAMMATION 20 tablet 0   Loratadine  10 MG CAPS Take 10 mg by mouth daily.     Multiple Vitamin (MULTIVITAMIN WITH MINERALS) TABS tablet Take 1 tablet by mouth in the morning. One A Day for Women 50+     omeprazole (PRILOSEC OTC) 20 MG tablet Take 20 mg by mouth daily before breakfast.     oxyCODONE  (OXY IR/ROXICODONE ) 5 MG immediate release tablet TAKE 1 TO 2 TABLETS BY MOUTH EVERY 6 HOURS AS NEEDED FOR PAIN 30 tablet 0   Psyllium (METAMUCIL PO) Take 3 tablets by mouth in the morning. Metamucil Fiber Gummies Plus Probiotics for Digestive Health     tiZANidine  (ZANAFLEX ) 4 MG tablet Take 1 tablet (4 mg total) by mouth every 6 (six) hours as needed for muscle spasms. 30 tablet 0   No current facility-administered medications for this visit.    Medication Side Effects: None  Allergies: Allergies[1]  Past Medical History:  Diagnosis Date   ADD (attention deficit disorder)    Anxiety    Arthritis    Blood transfusion without reported diagnosis    1993   Depression    GERD (gastroesophageal reflux disease)    in past but recently controlled with diet and exercise.   Hyperlipidemia    Hypertension    Pneumonia     Family History  Problem Relation Age of Onset   Cancer Mother    Diabetes Father    Heart disease Paternal Uncle    Hypertension Paternal Uncle    Colon cancer Neg Hx    Esophageal cancer Neg Hx    Rectal cancer Neg Hx    Stomach cancer Neg Hx    Stroke Neg Hx     Social History   Socioeconomic History   Marital status: Married    Spouse name: Not on file   Number of children: Not on file   Years of education: Not on file   Highest education level: Associate degree: occupational, scientist, product/process development, or vocational program  Occupational History   Not on file  Tobacco Use   Smoking status: Former   Smokeless tobacco: Never  Vaping Use    Vaping status: Every Day   Substances: Nicotine, Flavoring  Substance and Sexual Activity   Alcohol  use: Yes    Comment: occasionally   Drug use: No   Sexual activity: Yes    Partners: Male    Birth control/protection: None, Post-menopausal  Other Topics Concern   Not on file  Social History Narrative   Pt lives husband    Pt works    Social Drivers of Health   Tobacco Use: Medium Risk (01/18/2024)   Patient History    Smoking Tobacco Use: Former    Smokeless Tobacco Use: Never    Passive Exposure: Not on Actuary Strain: Low Risk (04/27/2023)   Overall Financial Resource Strain (CARDIA)  Difficulty of Paying Living Expenses: Not very hard  Food Insecurity: No Food Insecurity (05/13/2023)   Hunger Vital Sign    Worried About Running Out of Food in the Last Year: Never true    Ran Out of Food in the Last Year: Never true  Transportation Needs: No Transportation Needs (05/13/2023)   PRAPARE - Administrator, Civil Service (Medical): No    Lack of Transportation (Non-Medical): No  Physical Activity: Insufficiently Active (04/27/2023)   Exercise Vital Sign    Days of Exercise per Week: 2 days    Minutes of Exercise per Session: 20 min  Stress: No Stress Concern Present (04/27/2023)   [employer] of Occupational Health - Occupational Stress Questionnaire    Feeling of Stress : Only a little  Social Connections: Moderately Integrated (04/27/2023)   Social Connection and Isolation Panel    Frequency of Communication with Friends and Family: More than three times a week    Frequency of Social Gatherings with Friends and Family: Once a week    Attends Religious Services: 1 to 4 times per year    Active Member of [employer] or Organizations: No    Attends Engineer, Structural: Not on file    Marital Status: Married  Catering Manager Violence: Not At Risk (05/13/2023)   Humiliation, Afraid, Rape, and Kick questionnaire    Fear of Current or  Ex-Partner: No    Emotionally Abused: No    Physically Abused: No    Sexually Abused: No  Depression (PHQ2-9): Low Risk (05/04/2023)   Depression (PHQ2-9)    PHQ-2 Score: 0  Alcohol  Screen: Low Risk (04/27/2023)   Alcohol  Screen    Last Alcohol  Screening Score (AUDIT): 2  Housing: Low Risk (05/13/2023)   Housing Stability Vital Sign    Unable to Pay for Housing in the Last Year: No    Number of Times Moved in the Last Year: 0    Homeless in the Last Year: No  Utilities: Not At Risk (05/13/2023)   [facility] Utilities    Threatened with loss of utilities: No  Health Literacy: Not on file    Past Medical History, Surgical history, Social history, and Family history were reviewed and updated as appropriate.   Please see review of systems for further details on the patient's review from today.   Objective:   Physical Exam:  There were no vitals taken for this visit.  Physical Exam Constitutional:      General: She is not in acute distress. Musculoskeletal:        General: No deformity.  Neurological:     Mental Status: She is alert and oriented to person, place, and time.     Coordination: Coordination normal.  Psychiatric:        Attention and Perception: Attention and perception normal. She does not perceive auditory or visual hallucinations.        Mood and Affect: Mood normal. Mood is not anxious or depressed. Affect is not labile, blunt, angry or inappropriate.        Speech: Speech normal.        Behavior: Behavior normal.        Thought Content: Thought content normal. Thought content is not paranoid or delusional. Thought content does not include homicidal or suicidal ideation. Thought content does not include homicidal or suicidal plan.        Cognition and Memory: Cognition and memory normal.        Judgment: Judgment normal.  Comments: Insight intact     Lab Review:     Component Value Date/Time   NA 139 05/14/2023 0339   K 4.1 05/14/2023 0339   CL 105 05/14/2023  0339   CO2 28 05/14/2023 0339   GLUCOSE 137 (H) 05/14/2023 0339   BUN 17 05/14/2023 0339   CREATININE 0.77 05/14/2023 0339   CALCIUM  8.9 05/14/2023 0339   PROT 7.1 05/04/2023 1033   ALBUMIN 4.7 05/04/2023 1033   AST 21 05/04/2023 1033   ALT 35 05/04/2023 1033   ALKPHOS 101 05/04/2023 1033   BILITOT 0.5 05/04/2023 1033   GFRNONAA >60 05/14/2023 0339       Component Value Date/Time   WBC 8.8 05/14/2023 0339   RBC 4.48 05/14/2023 0339   HGB 12.7 05/14/2023 0339   HCT 41.0 05/14/2023 0339   PLT 218 05/14/2023 0339   MCV 91.5 05/14/2023 0339   MCH 28.3 05/14/2023 0339   MCHC 31.0 05/14/2023 0339   RDW 14.1 05/14/2023 0339   LYMPHSABS 2.7 05/04/2023 1033   MONOABS 0.7 05/04/2023 1033   EOSABS 0.1 05/04/2023 1033   BASOSABS 0.0 05/04/2023 1033    No results found for: POCLITH, LITHIUM   No results found for: PHENYTOIN, PHENOBARB, VALPROATE, CBMZ   .res Assessment: Plan:    Plan:  Continue: Adderall 30mg  BID Celexa  20 mg daily Wellbutrin  XL 300mg  Clonazepam  0.5mg  daily   Continue to monitor BP - WNL  RTC 3 months  15 minutes spent dedicated to the care of this patient on the date of this encounter to include pre-visit review of records, ordering of medication, post visit documentation, and face-to-face time with the patient discussing depression, anxiety, ADD and insomnia. Discussed continuing current medication regimen.  Patient advised to contact office with any questions, adverse effects, or acute worsening in signs and symptoms.  Discussed potential benefits, risk, and side effects of benzodiazepines to include potential risk of tolerance and dependence, as well as possible drowsiness. Advised patient not to drive if experiencing drowsiness and to take lowest possible effective dose to minimize risk of dependence and tolerance.  Discussed potential benefits, risks, and side effects of stimulants with patient to include increased heart rate,  palpitations, insomnia, increased anxiety, increased irritability, or decreased appetite. Instructed patient to contact office if experiencing any significant tolerability issues.  There are no diagnoses linked to this encounter.   Please see After Visit Summary for patient specific instructions.  Future Appointments  Date Time Provider Department Center  03/28/2024  1:00 PM Vernetta Lonni GRADE, MD [facility] None  05/04/2024  1:00 PM Frann Mabel Mt, DO [id number]    No orders of the defined types were placed in this encounter.     -------------------------------      [1] No Known Allergies

## 2024-03-28 ENCOUNTER — Other Ambulatory Visit: Payer: Self-pay

## 2024-03-28 ENCOUNTER — Ambulatory Visit: Payer: PRIVATE HEALTH INSURANCE | Admitting: Orthopaedic Surgery

## 2024-03-28 ENCOUNTER — Encounter: Payer: Self-pay | Admitting: Orthopaedic Surgery

## 2024-03-28 DIAGNOSIS — Z96652 Presence of left artificial knee joint: Secondary | ICD-10-CM

## 2024-03-28 NOTE — Progress Notes (Signed)
 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  She reports that she is doing well overall.  She even skied recently.  On exam her left knee has full range of motion and is ligamentously stable with no swelling and no effusion.  2 views of the left knee standing show well-seated total knee arthroplasty that is press-fit with no complicating features.  At this point follow-up for left knee can be as needed.  If she does develop swelling or issues with the knee she knows to let us  know.  If she does have worsening problems with the right knee she also knows to reach out or other orthopedic issues.

## 2024-05-04 ENCOUNTER — Encounter: Payer: PRIVATE HEALTH INSURANCE | Admitting: Family Medicine

## 2024-06-19 ENCOUNTER — Telehealth: Payer: Self-pay | Admitting: Adult Health
